# Patient Record
Sex: Female | Born: 1955 | Race: White | Hispanic: No | Smoking: Never smoker
Health system: Southern US, Community
[De-identification: ages and names within clinical notes are randomized; demographics above are authoritative.]

## PROBLEM LIST (undated history)

## (undated) DIAGNOSIS — R4182 Altered mental status, unspecified: Secondary | ICD-10-CM

## (undated) DIAGNOSIS — F419 Anxiety disorder, unspecified: Secondary | ICD-10-CM

## (undated) DIAGNOSIS — I10 Essential (primary) hypertension: Secondary | ICD-10-CM

## (undated) DIAGNOSIS — T7840XA Allergy, unspecified, initial encounter: Secondary | ICD-10-CM

## (undated) DIAGNOSIS — G600 Hereditary motor and sensory neuropathy: Secondary | ICD-10-CM

## (undated) DIAGNOSIS — F19939 Other psychoactive substance use, unspecified with withdrawal, unspecified: Principal | ICD-10-CM

## (undated) DIAGNOSIS — F29 Unspecified psychosis not due to a substance or known physiological condition: Secondary | ICD-10-CM

## (undated) DIAGNOSIS — F1320 Sedative, hypnotic or anxiolytic dependence, uncomplicated: Secondary | ICD-10-CM

## (undated) DIAGNOSIS — E876 Hypokalemia: Secondary | ICD-10-CM

## (undated) DIAGNOSIS — R9431 Abnormal electrocardiogram [ECG] [EKG]: Secondary | ICD-10-CM

## (undated) DIAGNOSIS — R109 Unspecified abdominal pain: Secondary | ICD-10-CM

## (undated) DIAGNOSIS — E860 Dehydration: Secondary | ICD-10-CM

## (undated) DIAGNOSIS — I5189 Other ill-defined heart diseases: Secondary | ICD-10-CM

## (undated) HISTORY — DX: Hypokalemia: E87.6

## (undated) HISTORY — DX: Dehydration: E86.0

## (undated) HISTORY — DX: Abnormal electrocardiogram (ECG) (EKG): R94.31

## (undated) HISTORY — DX: Allergy, unspecified, initial encounter: T78.40XA

## (undated) HISTORY — DX: Unspecified psychosis not due to a substance or known physiological condition: F29

## (undated) HISTORY — DX: Altered mental status, unspecified: R41.82

## (undated) HISTORY — DX: Unspecified abdominal pain: R10.9

## (undated) HISTORY — PX: ABDOMINAL HYSTERECTOMY: SHX81

---

## 2003-06-22 ENCOUNTER — Other Ambulatory Visit: Payer: Self-pay

## 2003-06-25 ENCOUNTER — Other Ambulatory Visit: Payer: Self-pay

## 2003-07-13 ENCOUNTER — Other Ambulatory Visit: Payer: Self-pay

## 2003-11-18 ENCOUNTER — Inpatient Hospital Stay: Payer: Self-pay | Admitting: Internal Medicine

## 2003-11-20 ENCOUNTER — Other Ambulatory Visit: Payer: Self-pay

## 2003-12-24 ENCOUNTER — Ambulatory Visit: Payer: Self-pay | Admitting: Internal Medicine

## 2004-02-14 ENCOUNTER — Emergency Department: Payer: Self-pay | Admitting: Emergency Medicine

## 2004-08-13 ENCOUNTER — Emergency Department: Payer: Self-pay | Admitting: Emergency Medicine

## 2004-08-23 ENCOUNTER — Emergency Department: Payer: Self-pay | Admitting: Emergency Medicine

## 2005-04-29 ENCOUNTER — Other Ambulatory Visit: Payer: Self-pay

## 2005-04-29 ENCOUNTER — Emergency Department: Payer: Self-pay | Admitting: Emergency Medicine

## 2005-09-25 ENCOUNTER — Emergency Department: Payer: Self-pay | Admitting: Emergency Medicine

## 2005-11-27 ENCOUNTER — Inpatient Hospital Stay: Payer: Self-pay | Admitting: Internal Medicine

## 2005-11-27 ENCOUNTER — Other Ambulatory Visit: Payer: Self-pay

## 2006-02-14 ENCOUNTER — Inpatient Hospital Stay: Payer: Self-pay | Admitting: Internal Medicine

## 2006-02-14 ENCOUNTER — Other Ambulatory Visit: Payer: Self-pay

## 2007-07-20 ENCOUNTER — Emergency Department: Payer: Self-pay | Admitting: Emergency Medicine

## 2007-11-20 ENCOUNTER — Emergency Department: Payer: Self-pay | Admitting: Emergency Medicine

## 2007-12-06 ENCOUNTER — Emergency Department: Payer: Self-pay | Admitting: Emergency Medicine

## 2007-12-25 ENCOUNTER — Emergency Department: Payer: Self-pay | Admitting: Emergency Medicine

## 2008-03-09 ENCOUNTER — Observation Stay: Payer: Self-pay | Admitting: Internal Medicine

## 2008-03-10 ENCOUNTER — Inpatient Hospital Stay: Payer: Self-pay | Admitting: Psychiatry

## 2011-10-24 ENCOUNTER — Emergency Department: Payer: Self-pay | Admitting: Emergency Medicine

## 2011-12-01 ENCOUNTER — Ambulatory Visit (HOSPITAL_COMMUNITY): Payer: Self-pay | Admitting: Physical Therapy

## 2011-12-06 ENCOUNTER — Ambulatory Visit (HOSPITAL_COMMUNITY): Payer: Self-pay | Admitting: Physical Therapy

## 2011-12-11 ENCOUNTER — Emergency Department (HOSPITAL_COMMUNITY): Payer: Self-pay

## 2011-12-11 ENCOUNTER — Inpatient Hospital Stay (HOSPITAL_COMMUNITY): Payer: Self-pay

## 2011-12-11 ENCOUNTER — Observation Stay (HOSPITAL_COMMUNITY)
Admission: EM | Admit: 2011-12-11 | Discharge: 2011-12-14 | DRG: 896 | Disposition: A | Discharge status: Home / Self Care | Payer: MEDICAID | Attending: Internal Medicine | Admitting: Internal Medicine

## 2011-12-11 ENCOUNTER — Encounter (HOSPITAL_COMMUNITY): Payer: Self-pay

## 2011-12-11 DIAGNOSIS — D72829 Elevated white blood cell count, unspecified: Secondary | ICD-10-CM | POA: Diagnosis present

## 2011-12-11 DIAGNOSIS — E860 Dehydration: Secondary | ICD-10-CM

## 2011-12-11 DIAGNOSIS — R55 Syncope and collapse: Secondary | ICD-10-CM

## 2011-12-11 DIAGNOSIS — Z79899 Other long term (current) drug therapy: Secondary | ICD-10-CM

## 2011-12-11 DIAGNOSIS — R9431 Abnormal electrocardiogram [ECG] [EKG]: Secondary | ICD-10-CM

## 2011-12-11 DIAGNOSIS — I5189 Other ill-defined heart diseases: Secondary | ICD-10-CM | POA: Diagnosis present

## 2011-12-11 DIAGNOSIS — F29 Unspecified psychosis not due to a substance or known physiological condition: Secondary | ICD-10-CM

## 2011-12-11 DIAGNOSIS — F19939 Other psychoactive substance use, unspecified with withdrawal, unspecified: Principal | ICD-10-CM

## 2011-12-11 DIAGNOSIS — E876 Hypokalemia: Secondary | ICD-10-CM

## 2011-12-11 DIAGNOSIS — G9340 Encephalopathy, unspecified: Secondary | ICD-10-CM | POA: Diagnosis present

## 2011-12-11 DIAGNOSIS — F1920 Other psychoactive substance dependence, uncomplicated: Secondary | ICD-10-CM | POA: Diagnosis present

## 2011-12-11 DIAGNOSIS — F19951 Other psychoactive substance use, unspecified with psychoactive substance-induced psychotic disorder with hallucinations: Secondary | ICD-10-CM | POA: Diagnosis present

## 2011-12-11 DIAGNOSIS — I10 Essential (primary) hypertension: Secondary | ICD-10-CM

## 2011-12-11 DIAGNOSIS — N289 Disorder of kidney and ureter, unspecified: Secondary | ICD-10-CM

## 2011-12-11 DIAGNOSIS — F419 Anxiety disorder, unspecified: Secondary | ICD-10-CM | POA: Diagnosis present

## 2011-12-11 DIAGNOSIS — F411 Generalized anxiety disorder: Secondary | ICD-10-CM | POA: Diagnosis present

## 2011-12-11 DIAGNOSIS — R443 Hallucinations, unspecified: Secondary | ICD-10-CM

## 2011-12-11 DIAGNOSIS — R4182 Altered mental status, unspecified: Secondary | ICD-10-CM

## 2011-12-11 DIAGNOSIS — R440 Auditory hallucinations: Secondary | ICD-10-CM

## 2011-12-11 DIAGNOSIS — A498 Other bacterial infections of unspecified site: Secondary | ICD-10-CM | POA: Diagnosis present

## 2011-12-11 DIAGNOSIS — F1320 Sedative, hypnotic or anxiolytic dependence, uncomplicated: Secondary | ICD-10-CM

## 2011-12-11 DIAGNOSIS — N390 Urinary tract infection, site not specified: Secondary | ICD-10-CM

## 2011-12-11 DIAGNOSIS — F1921 Other psychoactive substance dependence, in remission: Secondary | ICD-10-CM | POA: Diagnosis present

## 2011-12-11 HISTORY — DX: Abnormal electrocardiogram (ECG) (EKG): R94.31

## 2011-12-11 HISTORY — DX: Dehydration: E86.0

## 2011-12-11 HISTORY — DX: Altered mental status, unspecified: R41.82

## 2011-12-11 HISTORY — DX: Sedative, hypnotic or anxiolytic dependence, uncomplicated: F13.20

## 2011-12-11 HISTORY — DX: Anxiety disorder, unspecified: F41.9

## 2011-12-11 HISTORY — DX: Hypokalemia: E87.6

## 2011-12-11 HISTORY — DX: Essential (primary) hypertension: I10

## 2011-12-11 HISTORY — DX: Other psychoactive substance use, unspecified with withdrawal, unspecified: F19.939

## 2011-12-11 HISTORY — DX: Other ill-defined heart diseases: I51.89

## 2011-12-11 LAB — CBC WITH DIFFERENTIAL/PLATELET
Basophils Absolute: 0.1 10*3/uL (ref 0.0–0.1)
Basophils Relative: 0 % (ref 0–1)
Eosinophils Absolute: 0.1 10*3/uL (ref 0.0–0.7)
Hemoglobin: 14.7 g/dL (ref 12.0–15.0)
MCH: 34.2 pg — ABNORMAL HIGH (ref 26.0–34.0)
MCHC: 34.7 g/dL (ref 30.0–36.0)
Monocytes Relative: 8 % (ref 3–12)
Neutro Abs: 13.8 10*3/uL — ABNORMAL HIGH (ref 1.7–7.7)
Neutrophils Relative %: 75 % (ref 43–77)
Platelets: 316 10*3/uL (ref 150–400)
RDW: 14.2 % (ref 11.5–15.5)

## 2011-12-11 LAB — RAPID URINE DRUG SCREEN, HOSP PERFORMED
Barbiturates: NOT DETECTED
Cocaine: NOT DETECTED
Opiates: NOT DETECTED
Tetrahydrocannabinol: NOT DETECTED

## 2011-12-11 LAB — BASIC METABOLIC PANEL
Chloride: 96 mEq/L (ref 96–112)
GFR calc Af Amer: 50 mL/min — ABNORMAL LOW (ref >90)
GFR calc non Af Amer: 43 mL/min — ABNORMAL LOW (ref >90)
Potassium: 2.4 mEq/L — CL (ref 3.5–5.1)
Sodium: 136 mEq/L (ref 135–145)

## 2011-12-11 LAB — HEPATIC FUNCTION PANEL
AST: 30 U/L (ref 0–37)
Albumin: 4.5 g/dL (ref 3.5–5.2)
Total Bilirubin: 0.9 mg/dL (ref 0.3–1.2)
Total Protein: 8.1 g/dL (ref 6.0–8.3)

## 2011-12-11 LAB — URINALYSIS, ROUTINE W REFLEX MICROSCOPIC
Hgb urine dipstick: NEGATIVE
Nitrite: NEGATIVE
Protein, ur: NEGATIVE mg/dL
Specific Gravity, Urine: 1.025 (ref 1.005–1.030)
Urobilinogen, UA: 0.2 mg/dL (ref 0.0–1.0)

## 2011-12-11 LAB — TROPONIN I: Troponin I: <0.3 ng/mL (ref <0.30)

## 2011-12-11 LAB — LIPASE, BLOOD: Lipase: 31 U/L (ref 11–59)

## 2011-12-11 LAB — ETHANOL: Alcohol, Ethyl (B): <11 mg/dL (ref 0–11)

## 2011-12-11 LAB — URINE MICROSCOPIC-ADD ON

## 2011-12-11 MED ORDER — ONDANSETRON HCL 4 MG PO TABS: 4.0000 mg | ORAL_TABLET | Freq: Four times a day (QID) | ORAL | Status: DC | PRN

## 2011-12-11 MED ORDER — POTASSIUM CHLORIDE CRYS ER 20 MEQ PO TBCR
40.0000 meq | EXTENDED_RELEASE_TABLET | Freq: Once | ORAL | Status: AC
  Administered 2011-12-11: 40 meq via ORAL
  Filled 2011-12-11: qty 2

## 2011-12-11 MED ORDER — SODIUM CHLORIDE 0.9 % IV SOLN
INTRAVENOUS | Status: DC
  Administered 2011-12-11: 21:00:00 via INTRAVENOUS

## 2011-12-11 MED ORDER — POTASSIUM CHLORIDE 10 MEQ/100ML IV SOLN
10.0000 meq | INTRAVENOUS | Status: AC
  Administered 2011-12-11 – 2011-12-12 (×4): 10 meq via INTRAVENOUS
  Filled 2011-12-11: qty 400

## 2011-12-11 MED ORDER — ACETAMINOPHEN 650 MG RE SUPP: 650.0000 mg | Freq: Four times a day (QID) | RECTAL | Status: DC | PRN

## 2011-12-11 MED ORDER — ACETAMINOPHEN 325 MG PO TABS
650.0000 mg | ORAL_TABLET | Freq: Four times a day (QID) | ORAL | Status: DC | PRN
  Administered 2011-12-12 – 2011-12-13 (×2): 650 mg via ORAL
  Filled 2011-12-11 (×2): qty 2

## 2011-12-11 MED ORDER — ONDANSETRON HCL 4 MG/2ML IJ SOLN: 4.0000 mg | Freq: Four times a day (QID) | INTRAMUSCULAR | Status: DC | PRN

## 2011-12-11 MED ORDER — POTASSIUM CHLORIDE IN NACL 20-0.9 MEQ/L-% IV SOLN
INTRAVENOUS | Status: AC
  Administered 2011-12-11: 23:00:00 via INTRAVENOUS

## 2011-12-11 MED ORDER — POTASSIUM CHLORIDE 10 MEQ/100ML IV SOLN
10.0000 meq | INTRAVENOUS | Status: DC
  Administered 2011-12-11 (×2): 10 meq via INTRAVENOUS
  Filled 2011-12-11 (×2): qty 100

## 2011-12-11 MED ORDER — SODIUM CHLORIDE 0.9 % IV BOLUS (SEPSIS)
500.0000 mL | Freq: Once | INTRAVENOUS | Status: AC
  Administered 2011-12-11: 500 mL via INTRAVENOUS

## 2011-12-11 MED ORDER — HYDRALAZINE HCL 20 MG/ML IJ SOLN
10.0000 mg | Freq: Four times a day (QID) | INTRAMUSCULAR | Status: DC | PRN
  Administered 2011-12-12: 10 mg via INTRAVENOUS
  Filled 2011-12-11: qty 1

## 2011-12-11 MED ORDER — POTASSIUM CHLORIDE CRYS ER 20 MEQ PO TBCR
40.0000 meq | EXTENDED_RELEASE_TABLET | ORAL | Status: AC
  Administered 2011-12-11 – 2011-12-12 (×2): 40 meq via ORAL
  Filled 2011-12-11 (×2): qty 2

## 2011-12-11 MED ORDER — ALBUTEROL SULFATE (5 MG/ML) 0.5% IN NEBU: 2.5000 mg | INHALATION_SOLUTION | RESPIRATORY_TRACT | Status: DC | PRN

## 2011-12-11 NOTE — ED Notes (Signed)
CRITICAL VALUE ALERT  Critical value received:  Potassium - 2.4  Date of notification:  12/11/2011  Time of notification:  1900  Critical value read back: yes  Nurse who received alert:  Ricard Dillon  MD notified (1st page):  Dr Lynelle Doctor  Time of first page:  1900  MD notified (2nd page):  Time of second page:  Responding MD:  Dr Lynelle Doctor  Time MD responded:  Mikle Bosworth
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
## 2011-12-11 NOTE — ED Notes (Signed)
Attempted to call report, nurse is with a patient and will call me right back.
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
## 2011-12-12 DIAGNOSIS — F19239 Other psychoactive substance dependence with withdrawal, unspecified: Principal | ICD-10-CM

## 2011-12-12 DIAGNOSIS — F29 Unspecified psychosis not due to a substance or known physiological condition: Secondary | ICD-10-CM

## 2011-12-12 DIAGNOSIS — F19939 Other psychoactive substance use, unspecified with withdrawal, unspecified: Principal | ICD-10-CM

## 2011-12-12 HISTORY — DX: Other psychoactive substance use, unspecified with withdrawal, unspecified: F19.939

## 2011-12-12 HISTORY — DX: Unspecified psychosis not due to a substance or known physiological condition: F29

## 2011-12-12 HISTORY — DX: Other psychoactive substance dependence with withdrawal, unspecified: F19.239

## 2011-12-12 LAB — CBC
HCT: 40.6 % (ref 36.0–46.0)
MCH: 33.1 pg (ref 26.0–34.0)
MCHC: 33.5 g/dL (ref 30.0–36.0)
MCV: 98.8 fL (ref 78.0–100.0)
RDW: 14.3 % (ref 11.5–15.5)

## 2011-12-12 LAB — COMPREHENSIVE METABOLIC PANEL
ALT: 26 U/L (ref 0–35)
Albumin: 4.1 g/dL (ref 3.5–5.2)
Alkaline Phosphatase: 86 U/L (ref 39–117)
Potassium: 5.1 mEq/L (ref 3.5–5.1)
Sodium: 137 mEq/L (ref 135–145)
Total Protein: 7.5 g/dL (ref 6.0–8.3)

## 2011-12-12 MED ORDER — LORAZEPAM 2 MG/ML IJ SOLN: 1.0000 mg | Freq: Four times a day (QID) | INTRAMUSCULAR | Status: DC

## 2011-12-12 MED ORDER — ALPRAZOLAM 1 MG PO TABS
1.0000 mg | ORAL_TABLET | Freq: Four times a day (QID) | ORAL | Status: DC
  Administered 2011-12-12 – 2011-12-14 (×7): 1 mg via ORAL
  Filled 2011-12-12 (×8): qty 1

## 2011-12-12 MED ORDER — LORAZEPAM 1 MG PO TABS
1.0000 mg | ORAL_TABLET | Freq: Four times a day (QID) | ORAL | Status: DC
  Administered 2011-12-12 (×2): 1 mg via ORAL
  Filled 2011-12-12: qty 1

## 2011-12-12 MED ORDER — LORAZEPAM 2 MG/ML IJ SOLN
1.0000 mg | INTRAMUSCULAR | Status: DC | PRN
  Filled 2011-12-12: qty 1

## 2011-12-12 MED ORDER — SODIUM CHLORIDE 0.9 % IJ SOLN
INTRAMUSCULAR | Status: AC
  Filled 2011-12-12: qty 3
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
## 2011-12-12 NOTE — Progress Notes (Signed)
Ativan po given to patient per Physician order.  Patient refused Ativan, said I was trying to poison her, I had not opened the package yet so I offered the patient  the packaging so she knows that we are not poisoning her.  Patient states (cussing) that we can stop piping in the chemicals in the air conditioner.  She is not a fool and can tell we are doing that, "her mouth is dry and tastes like shit".
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
## 2011-12-12 NOTE — BH Assessment (Addendum)
[facility] Assessment Progress Note      12/12/11.  1330 Referral made to [facility].  Pt not on wait list at this point.  [facility] contacted for authorization.  Pt locus score: 27.  [facility] requires denials even for self pay patients.  Contacted [facility] who was full, [facility] also full. Larita Fife).  [facility] has info.  Message left with [facility].  [facility]) also has no beds.   1400 [employer] Authorization:  7 days, 11/10-11/16.  Auth #[id number].  Daleen Squibb, LCSWA  11010/13. 1433.  [facility] called back and said referral must be made to [facility] for primary substance abuse pt.  Referral information was then sent to [facility]. [phone].

## 2011-12-12 NOTE — Progress Notes (Signed)
Called to see patient at her request to see the boss.  Patient irriate, cussing during her conversation with me.  She states that staff are not caring for her, not answering her call bell, refusing to bring her ice water, and to take her to bathroom.  She doesn't want the nurse or tech caring for her back in her room.  I offered other staff and she said that she did not want any of our staff taking care of her, as no one is doing their job.  While talking with her she ripped out her IV, ripped off the telemetry, and states that she wants no one in room.  As I was leaving she threw the tissue box at me.  I went and got Dr. Kerry Hough to come and talk with her.  Security called and [employer] police called.  Dr. Kerry Hough went in to talk with patient with continued loud talk, cussing and ended up throwing a water pitcher at him.  Police went in listened to her complaints, told her that a psych eval would be done and she would have to go along with it.  Instructed her that she was not to throw anything else at anyone, to take the medicine that the physician would order.
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
## 2011-12-13 DIAGNOSIS — N390 Urinary tract infection, site not specified: Secondary | ICD-10-CM | POA: Diagnosis present

## 2011-12-13 MED ORDER — BOOST / RESOURCE BREEZE PO LIQD
1.0000 | Freq: Two times a day (BID) | ORAL | Status: DC
  Administered 2011-12-13: 1 via ORAL
  Filled 2011-12-13 (×7): qty 1

## 2011-12-13 MED ORDER — ATENOLOL 25 MG PO TABS
50.0000 mg | ORAL_TABLET | Freq: Every day | ORAL | Status: DC
  Administered 2011-12-13 – 2011-12-14 (×2): 50 mg via ORAL
  Filled 2011-12-13 (×2): qty 2

## 2011-12-13 MED ORDER — CIPROFLOXACIN HCL 250 MG PO TABS
500.0000 mg | ORAL_TABLET | Freq: Two times a day (BID) | ORAL | Status: DC
  Administered 2011-12-13 – 2011-12-14 (×3): 500 mg via ORAL
  Filled 2011-12-13 (×3): qty 2

## 2011-12-13 NOTE — Progress Notes (Signed)
Pt was seen for a screen and found to have no PT needs.  PT will be d/c'd.

## 2011-12-13 NOTE — Progress Notes (Signed)
Held 2200 xanax due to pt finally sleeping well.  Continues to sleep at this time. No IV access due to pt apparently pulled it out earlier. Pt has no telemetry, no IV fluids or red socks due to refusing to keep them on in her apparent manic state.
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
## 2011-12-13 NOTE — Progress Notes (Signed)
INITIAL ADULT NUTRITION ASSESSMENT Date: 12/13/2011   Time: 11:45 AM Reason for Assessment: Malnutrition Screen  ASSESSMENT: Female 56 y.o.  Dx: Altered mental status   Past Medical History  Diagnosis Date  . Hypertension   . Anxiety     Scheduled Meds:   . ALPRAZolam  1 mg Oral QID  . atenolol  50 mg Oral Daily  . [EXPIRED] sodium chloride      . [DISCONTINUED] LORazepam  1 mg Intramuscular Q6H  . [DISCONTINUED] LORazepam  1 mg Oral Q6H   Continuous Infusions:  PRN Meds:.acetaminophen, acetaminophen, hydrALAZINE, LORazepam, ondansetron (ZOFRAN) IV, ondansetron  Ht: 5\' 5"  (165.1 cm)  Wt: 127 lb 3.3 oz (57.7 kg)  Ideal Wt: 57 kg % Ideal Wt: 101%  Usual Wt: Wt Readings from Last 10 Encounters:  12/11/11 127 lb 3.3 oz (57.7 kg)    Body mass index is 21.17 kg/(m^2). Within Normal Range  Food/Nutrition Related Hx: Pt unable to provide hx at this time due to altered mental status.Sitter is with her and reports she just received Xanax medication. Her current oral intake is inadequate to meet estimated needs (25%). However, based on available hx she doesn't meet criteria for malnutrition at this time.   CMP     Component Value Date/Time   NA 137 12/12/2011 0640   K 5.1 12/12/2011 0640   CL 107 12/12/2011 0640   CO2 22 12/12/2011 0640   GLUCOSE 99 12/12/2011 0640   BUN 8 12/12/2011 0640   CREATININE 0.90 12/12/2011 0640   CALCIUM 9.7 12/12/2011 0640   PROT 7.5 12/12/2011 0640   ALBUMIN 4.1 12/12/2011 0640   AST 24 12/12/2011 0640   ALT 26 12/12/2011 0640   ALKPHOS 86 12/12/2011 0640   BILITOT 1.0 12/12/2011 0640   GFRNONAA 70* 12/12/2011 0640   GFRAA 81* 12/12/2011 0640    Intake/Output Summary (Last 24 hours) at 12/13/11 1147 Last data filed at 12/13/11 0800  Gross per 24 hour  Intake      0 ml  Output      0 ml  Net      0 ml    Diet Order: Sodium Restricted  Supplements/Tube Feeding:none at this time  IVF:    Estimated Nutritional  Needs:   Kcal:1450-1740 kcal Protein:64-75 gr Fluid:1 ml/kcal  NUTRITION DIAGNOSIS: -Inadequate oral intake (NI-2.1).  Status: Ongoing  RELATED TO: altered mental status  AS EVIDENCE BY: meal intake at 25%  MONITORING/EVALUATION(Goals): Monitor meals and oral supplement intake also labs and wt changes Goal: Pt to meet >/= 90% of their estimated nutrition needs; not met  EDUCATION NEEDS: -No education needs identified at this time  INTERVENTION: -Add Resource Breeze po BID, each supplement provides 250 kcal and 9 grams of protein.  Dietitian [phone]  DOCUMENTATION CODES Per approved criteria  -Not Applicable    Francene Boyers 12/13/2011, 11:45 AM

## 2011-12-13 NOTE — Progress Notes (Signed)
*  PRELIMINARY RESULTS* Echocardiogram 2D Echocardiogram has been performed.  Conrad [place] 12/13/2011, 10:05 AM

## 2011-12-14 ENCOUNTER — Encounter (HOSPITAL_COMMUNITY): Payer: Self-pay | Admitting: Internal Medicine

## 2011-12-14 DIAGNOSIS — F1320 Sedative, hypnotic or anxiolytic dependence, uncomplicated: Secondary | ICD-10-CM

## 2011-12-14 DIAGNOSIS — F419 Anxiety disorder, unspecified: Secondary | ICD-10-CM

## 2011-12-14 DIAGNOSIS — I5189 Other ill-defined heart diseases: Secondary | ICD-10-CM

## 2011-12-14 HISTORY — DX: Anxiety disorder, unspecified: F41.9

## 2011-12-14 HISTORY — DX: Other ill-defined heart diseases: I51.89

## 2011-12-14 HISTORY — DX: Sedative, hypnotic or anxiolytic dependence, uncomplicated: F13.20

## 2011-12-14 LAB — BASIC METABOLIC PANEL
Calcium: 9.6 mg/dL (ref 8.4–10.5)
GFR calc Af Amer: 84 mL/min — ABNORMAL LOW (ref >90)
GFR calc non Af Amer: 73 mL/min — ABNORMAL LOW (ref >90)
Glucose, Bld: 96 mg/dL (ref 70–99)
Potassium: 4.1 mEq/L (ref 3.5–5.1)
Sodium: 137 mEq/L (ref 135–145)

## 2011-12-14 LAB — CBC
Hemoglobin: 13.5 g/dL (ref 12.0–15.0)
MCH: 33.6 pg (ref 26.0–34.0)
MCHC: 33.9 g/dL (ref 30.0–36.0)
Platelets: 233 10*3/uL (ref 150–400)
RDW: 14.1 % (ref 11.5–15.5)

## 2011-12-14 LAB — URINE CULTURE: Colony Count: >=100000

## 2011-12-14 MED ORDER — ALPRAZOLAM 1 MG PO TABS: 1.0000 mg | ORAL_TABLET | Freq: Four times a day (QID) | ORAL | Status: DC

## 2011-12-14 MED ORDER — TRAZODONE HCL 50 MG PO TABS
25.0000 mg | ORAL_TABLET | Freq: Once | ORAL | Status: AC
  Administered 2011-12-14: 25 mg via ORAL
  Filled 2011-12-14: qty 1

## 2011-12-14 MED ORDER — CIPROFLOXACIN HCL 500 MG PO TABS: 500.0000 mg | ORAL_TABLET | Freq: Two times a day (BID) | ORAL | Status: DC

## 2011-12-14 NOTE — Clinical Social Work Psychosocial (Signed)
Clinical Social Work Department BRIEF PSYCHOSOCIAL ASSESSMENT 12/14/2011  Patient:  Maria Wheeler, Maria Wheeler     Account Number:  [id number]     Admit date:  12/11/2011  Clinical Social Worker:  Nancie Neas  Date/Time:  12/14/2011 01:25 PM  Referred by:  Physician  Date Referred:  12/14/2011 Referred for  Behavioral Health Issues   Other Referral:   Interview type:  Patient Other interview type:    PSYCHOSOCIAL DATA Living Status:  ALONE Admitted from facility:   Level of care:   Primary support name:  Barbara Cower Primary support relationship to patient:  CHILD, ADULT Degree of support available:   supportive per pt    CURRENT CONCERNS Current Concerns  Behavioral Health Issues   Other Concerns:    SOCIAL WORK ASSESSMENT / PLAN CSW met with pt at bedside following referral for outpatient mental health follow up. Pt assessed by ACT and determined not to need inpatient treatment. Pt alert and oriented and states she lives alone. Her son lives in [place] and pt reports they have a good relationship. Pt was laid off 3 months ago. She said she has limited support in [place] as the only reason she moved here was for her job. Pt states her PCP, Dr. Loney Hering stopped prescribing her Xanax because he said pt called another MD to get another prescription. Pt admitted to overusing Xanax in the past. Due to stopping Xanax abruptly, pt became very confused and was hallucinating. Pt admitted for medical clearance for inpatient treatment, but this was no longer felt to be needed. Pt indicates she saw a psychiatrist in 1995/07/04 after the death of her nephew in a care acciden. This is also when she started taking Xanax. Pt is agreeable to outpatient follow up at [facility] tomorrow morning. Information on AVS for d/c.   Assessment/plan status:  Referral to [facility] Other assessment/ plan:   Information/referral to community resources:   [facility]  [facility]    PATIENT'S/FAMILY'S RESPONSE TO PLAN OF  CARE: Pt has transportation for appointment tomorrow but is concerned about getting home today. Contact information given for pt to call [facility].        Derenda Fennel, [place] [phone]
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
## 2012-12-22 DIAGNOSIS — R109 Unspecified abdominal pain: Secondary | ICD-10-CM

## 2012-12-22 HISTORY — DX: Unspecified abdominal pain: R10.9

## 2013-04-02 DIAGNOSIS — F418 Other specified anxiety disorders: Secondary | ICD-10-CM | POA: Insufficient documentation

## 2013-12-30 ENCOUNTER — Emergency Department: Payer: Self-pay | Admitting: Emergency Medicine

## 2013-12-30 LAB — PROTIME-INR
INR: 1
Prothrombin Time: 13.2 secs (ref 11.5–14.7)

## 2013-12-30 LAB — COMPREHENSIVE METABOLIC PANEL
ALK PHOS: 85 U/L
ALT: 34 U/L
ANION GAP: 10 (ref 7–16)
Albumin: 3.3 g/dL — ABNORMAL LOW (ref 3.4–5.0)
BILIRUBIN TOTAL: 0.4 mg/dL (ref 0.2–1.0)
BUN: 4 mg/dL — AB (ref 7–18)
CHLORIDE: 104 mmol/L (ref 98–107)
CREATININE: 0.67 mg/dL (ref 0.60–1.30)
Calcium, Total: 8.3 mg/dL — ABNORMAL LOW (ref 8.5–10.1)
Co2: 20 mmol/L — ABNORMAL LOW (ref 21–32)
EGFR (African American): >60
Glucose: 89 mg/dL (ref 65–99)
Osmolality: 265 (ref 275–301)
POTASSIUM: 4.1 mmol/L (ref 3.5–5.1)
SGOT(AST): 31 U/L (ref 15–37)
Sodium: 134 mmol/L — ABNORMAL LOW (ref 136–145)
Total Protein: 7.2 g/dL (ref 6.4–8.2)

## 2013-12-30 LAB — MAGNESIUM: Magnesium: 1.9 mg/dL

## 2013-12-30 LAB — LIPASE, BLOOD: LIPASE: 173 U/L (ref 73–393)

## 2013-12-30 LAB — ETHANOL: Ethanol: 159 mg/dL

## 2013-12-30 LAB — TROPONIN I: Troponin-I: <0.02 ng/mL

## 2013-12-30 LAB — APTT: ACTIVATED PTT: 28.9 s (ref 23.6–35.9)

## 2013-12-31 DIAGNOSIS — S065XAA Traumatic subdural hemorrhage with loss of consciousness status unknown, initial encounter: Secondary | ICD-10-CM | POA: Insufficient documentation

## 2013-12-31 DIAGNOSIS — S065X9A Traumatic subdural hemorrhage with loss of consciousness of unspecified duration, initial encounter: Secondary | ICD-10-CM | POA: Insufficient documentation

## 2014-01-01 ENCOUNTER — Emergency Department: Payer: Self-pay | Admitting: Emergency Medicine

## 2014-01-01 LAB — URINALYSIS, COMPLETE
BILIRUBIN, UR: NEGATIVE
Blood: NEGATIVE
GLUCOSE, UR: NEGATIVE mg/dL (ref 0–75)
KETONE: NEGATIVE
Leukocyte Esterase: NEGATIVE
Nitrite: POSITIVE
PH: 5 (ref 4.5–8.0)
Protein: NEGATIVE
RBC,UR: 2 /HPF (ref 0–5)
Specific Gravity: 1.009 (ref 1.003–1.030)

## 2014-01-04 LAB — URINE CULTURE

## 2014-04-11 ENCOUNTER — Ambulatory Visit: Payer: Self-pay | Admitting: Neurology

## 2014-05-25 NOTE — Consult Note (Signed)
Asked by Dr Fanny BienQuale to do an ER Consult for accelerated HTN accelerated HTNon atenolol 100mg  po dailyhas taken norvasc in the past- will try 10mg  po stat and dailycould be secondary to pain with headacheHeadache, subdural heamtomaER physician had set up [facility] but PT was unable to work with the patient with elevated Blood [facility] and sent homeCharcot Marie Tooth diseaseF/u UC sent in ER, no urinary symptoms  Electronic Signatures: Alford HighlandWieting, Taja Pentland (MD)  (Signed on 02-Dec-15 17:57)  Authored  Last Updated: 02-Dec-15 17:57 by Alford HighlandWieting, Tamaya Pun (MD)

## 2014-05-25 NOTE — Consult Note (Signed)
PATIENT NAME:  Maria Wheeler, Maria Wheeler MR#:  [id number] DATE OF BIRTH:  [birth year]  DATE OF CONSULTATION:  01/02/2014  REFERRING PHYSICIAN:  Raelyn Ensign, MD, ER physician   CONSULTING PHYSICIAN:  Herschell Dimes. Renae Gloss, MD  PRIMARY CARE PHYSICIAN:  Marletta Lor, FNP   REASON FOR CONSULTATION: Accelerated hypertension.   HISTORY OF PRESENT ILLNESS: This is a 59 year old female who was recently released from [facility] after being there for 3 days for subdural hematoma after a fall. Her balance has been off because of the Charcot-Marie-Tooth disease and she had a fall and was evaluated there for the subdural hematoma. At home, she was barely able to walk and really dizzy and afraid to fall and called EMS to come to the ER. She has been in the ER now for 31 hours, and ER physician asked me to evaluate her for accelerated hypertension because physical therapy would not work with her with the blood pressure being elevated. She does have a bed at [facility], and is awaiting transfer there.   PAST MEDICAL HISTORY: Hypertension, subdural hematoma after a fall, Charcot-Marie-Tooth disease.   PAST SURGICAL HISTORY: Hysterectomy.   ALLERGIES: PENICILLIN.   MEDICATIONS: At home include Atenolol 100 mg daily.   SOCIAL HISTORY: No smoking, occasional beer, no drug use, is on disability with her Charcot-Marie-Tooth disease.   FAMILY HISTORY: Father died of COPD, mother died of a Charcot-Marie-Tooth disease.   REVIEW OF SYSTEMS:  CONSTITUTIONAL: Positive for fatigue. No fever, chills, or sweats.  EYES: She does wear glasses.  EARS AND NOSE AND MOUTH AND THROAT: No hearing loss. No sore throat. No difficulty swallowing.  CARDIOVASCULAR: No chest pain. No palpitations.  RESPIRATORY: No shortness of breath. No cough. No sputum. No hemoptysis.  GASTROINTESTINAL: Diarrhea yesterday after a Percocet given. No nausea, vomiting, or abdominal pain. No bright red blood per rectum. No melena.  GENITOURINARY: No  burning on urination or hematuria.  MUSCULOSKELETAL: No joint pain or muscle pain.  INTEGUMENT: No rashes or eruptions.  NEUROLOGIC: No fainting or blackouts.  PSYCHIATRIC: No anxiety or depression.  ENDOCRINE: No thyroid problems.  HEMATOLOGIC AND LYMPHATIC: No anemia, no easy bruising or bleeding.   PHYSICAL EXAMINATION: VITAL SIGNS: Temperature 98.8, pulse 78, respirations 16, blood pressure 190/108, pulse oximetry 98% on room air. When she came into the hospital 31 hours ago and 31 minutes, she had a blood pressure of 130/89, at that time; pulse of 81, respirations 16.  GENERAL: No respiratory distress.  EYES: Conjunctivae and lids normal. Pupils equal, round, and reactive to light. Extraocular muscles intact. No nystagmus.  EARS AND NOSE AND MOUTH AND THROAT: Nasal mucosa, no erythema; throat, no erythema, no exudate seen; lips and gums, no lesions.  NECK: No JVD. No bruits. No lymphadenopathy. No thyromegaly. No thyroid nodules palpated.  RESPIRATORY: Lungs, clear to auscultation. No use of accessory muscles to breathe. No rhonchi, rales, or wheeze heard.  CARDIOVASCULAR: S1 and S2 normal. No gallops, rubs, or murmurs heard. Carotid upstroke 2+ bilaterally. No bruits. Dorsalis pedis pulses 2+ bilaterally. No edema of the lower extremity.  ABDOMEN: Soft, nontender. No organomegaly/splenomegaly. Normoactive bowel sounds. No masses felt.  LYMPHATIC: No lymph nodes in the neck.  MUSCULOSKELETAL: No clubbing, edema, or cyanosis.  SKIN: No rashes or ulcers seen.  NEUROLOGIC: Cranial nerves II through XII grossly intact; deep tendon reflexes 1+ bilateral in lower extremities.  PSYCHIATRIC: The patient is oriented to person, place, and time.   DIAGNOSTIC DATA: Urinalysis, positive nitrites, but leukocyte  esterase was negative; CT scan of the head showed small anterior falx sign, subdural hematoma, slightly smaller compared to recent prior study. Minimal subdural fluid, more posterior and  superior in location and the falx is slightly less prominent. EKG normal sinus rhythm, nonspecific ST-T wave changes; recent labs, November 29, included a glucose of 89, BUN 4, creatinine 0.67, sodium 134, potassium 4.1, chloride 104, CO2 of 20, calcium 8.3; liver function tests normal range; albumin low at 3.3, lipase 173, magnesium 1.9, INR 1.0, troponin negative, ethanol 159.   ASSESSMENT AND PLAN: 1.  Accelerated hypertension. The patient is on atenolol 100 mg daily. I will continue that on a daily basis. The patient has had Norvasc in the past. I will give 10 mg STAT dose of Norvasc and continue that on a daily basis. Part of the reason for accelerated hypertension may headache. She has had a headache since her subdural hematoma, so controlling pain may help.  2.  Headache with subdural hematoma. Control pain, I will try Norco 5/325 one tablet every 4 hours as needed for pain. She must take food with this. She did not tolerate the Percocet and oxycodone in the past.  We will try the Norco and see if that helps. The patient will follow up with [facility] for follow-up of her subdural hematoma and further testing with her balance.  3.  Charcot-Marie-Tooth disease. The patient has had balance issues with this. Follow up as outpatient with neurology.  4.  Follow up urine culture results that was sent in the ER. Urinalysis has trace leukocyte esterase, see if anything grows out of the urine culture and treat if needed. The patient does not have any urinary symptoms, so I will hold off on further treatment at this point.   TIME SPENT ON CONSULTATION: Was 50 minutes.   As per Dr. Fanny BienQuale, patient can stay in the ER. The patient will go to [facility] once cleared by physical therapy and a physical therapy evaluation; hopefully, blood pressure will be better for this evaluation and can be done soon.    ____________________________ Herschell Dimesichard J. Renae GlossWieting, MD rjw:nt D: 01/02/2014 18:06:28  ET T: 01/02/2014 18:34:25 ET JOB#: [id number]  cc: Herschell Dimesichard J. Renae GlossWieting, MD, <Dictator>  Salley ScarletICHARD J Kadejah Sandiford MD ELECTRONICALLY SIGNED 01/04/2014 12:16

## 2014-07-14 ENCOUNTER — Other Ambulatory Visit: Payer: Self-pay

## 2014-07-14 ENCOUNTER — Encounter: Payer: Self-pay | Admitting: Emergency Medicine

## 2014-07-14 ENCOUNTER — Emergency Department
Admission: EM | Admit: 2014-07-14 | Discharge: 2014-07-14 | Disposition: A | Discharge status: Home / Self Care | Payer: No Typology Code available for payment source | Attending: Emergency Medicine | Admitting: Emergency Medicine

## 2014-07-14 DIAGNOSIS — Z880 Allergy status to penicillin: Secondary | ICD-10-CM | POA: Diagnosis not present

## 2014-07-14 DIAGNOSIS — Z79899 Other long term (current) drug therapy: Secondary | ICD-10-CM | POA: Insufficient documentation

## 2014-07-14 DIAGNOSIS — M6281 Muscle weakness (generalized): Secondary | ICD-10-CM | POA: Diagnosis present

## 2014-07-14 DIAGNOSIS — Z792 Long term (current) use of antibiotics: Secondary | ICD-10-CM | POA: Diagnosis not present

## 2014-07-14 DIAGNOSIS — I10 Essential (primary) hypertension: Secondary | ICD-10-CM | POA: Diagnosis not present

## 2014-07-14 DIAGNOSIS — N390 Urinary tract infection, site not specified: Secondary | ICD-10-CM | POA: Insufficient documentation

## 2014-07-14 DIAGNOSIS — R531 Weakness: Secondary | ICD-10-CM

## 2014-07-14 LAB — URINALYSIS COMPLETE WITH MICROSCOPIC (ARMC ONLY)
Bilirubin Urine: NEGATIVE
Glucose, UA: NEGATIVE mg/dL
HGB URINE DIPSTICK: NEGATIVE
Ketones, ur: NEGATIVE mg/dL
NITRITE: NEGATIVE
PROTEIN: NEGATIVE mg/dL
SPECIFIC GRAVITY, URINE: 1.016 (ref 1.005–1.030)
pH: 6 (ref 5.0–8.0)

## 2014-07-14 LAB — CBC WITH DIFFERENTIAL/PLATELET
BASOS ABS: 0.1 10*3/uL (ref 0–0.1)
BASOS PCT: 1 %
EOS ABS: 0.1 10*3/uL (ref 0–0.7)
EOS PCT: 2 %
HEMATOCRIT: 41.4 % (ref 35.0–47.0)
Hemoglobin: 14.1 g/dL (ref 12.0–16.0)
LYMPHS ABS: 2.1 10*3/uL (ref 1.0–3.6)
LYMPHS PCT: 31 %
MCH: 33.1 pg (ref 26.0–34.0)
MCHC: 34.1 g/dL (ref 32.0–36.0)
MCV: 97.2 fL (ref 80.0–100.0)
MONOS PCT: 9 %
Monocytes Absolute: 0.6 10*3/uL (ref 0.2–0.9)
Neutro Abs: 3.9 10*3/uL (ref 1.4–6.5)
Neutrophils Relative %: 57 %
PLATELETS: 214 10*3/uL (ref 150–440)
RBC: 4.25 MIL/uL (ref 3.80–5.20)
RDW: 15.3 % — ABNORMAL HIGH (ref 11.5–14.5)
WBC: 6.9 10*3/uL (ref 3.6–11.0)

## 2014-07-14 LAB — BASIC METABOLIC PANEL
Anion gap: 10 (ref 5–15)
BUN: 8 mg/dL (ref 6–20)
CO2: 27 mmol/L (ref 22–32)
CREATININE: 0.83 mg/dL (ref 0.44–1.00)
Calcium: 9.3 mg/dL (ref 8.9–10.3)
Chloride: 95 mmol/L — ABNORMAL LOW (ref 101–111)
GFR calc Af Amer: >60 mL/min (ref >60)
GFR calc non Af Amer: >60 mL/min (ref >60)
Glucose, Bld: 104 mg/dL — ABNORMAL HIGH (ref 65–99)
Potassium: 3 mmol/L — ABNORMAL LOW (ref 3.5–5.1)
SODIUM: 132 mmol/L — AB (ref 135–145)

## 2014-07-14 MED ORDER — SODIUM CHLORIDE 0.9 % IV BOLUS (SEPSIS): 1000.0000 mL | Freq: Once | INTRAVENOUS | Status: AC

## 2014-07-14 MED ORDER — CEPHALEXIN 500 MG PO CAPS: 500.0000 mg | ORAL_CAPSULE | Freq: Three times a day (TID) | ORAL | Status: AC

## 2014-07-14 MED ORDER — HYDROCODONE-ACETAMINOPHEN 5-325 MG PO TABS
1.0000 | ORAL_TABLET | Freq: Once | ORAL | Status: AC
Start: 2014-07-14 — End: 2014-07-14
  Administered 2014-07-14: 1 via ORAL

## 2014-07-14 MED ORDER — POTASSIUM CHLORIDE CRYS ER 20 MEQ PO TBCR
40.0000 meq | EXTENDED_RELEASE_TABLET | Freq: Once | ORAL | Status: AC
  Administered 2014-07-14: 40 meq via ORAL

## 2014-07-14 MED ORDER — SODIUM CHLORIDE 0.9 % IV BOLUS (SEPSIS)
1000.0000 mL | Freq: Once | INTRAVENOUS | Status: AC
  Administered 2014-07-14: 1000 mL via INTRAVENOUS

## 2014-07-14 MED ORDER — TRAMADOL HCL 50 MG PO TABS: 50.0000 mg | ORAL_TABLET | Freq: Four times a day (QID) | ORAL | Status: AC | PRN

## 2014-07-14 MED ORDER — HYDROCODONE-ACETAMINOPHEN 5-325 MG PO TABS
ORAL_TABLET | ORAL | Status: AC
  Filled 2014-07-14: qty 1

## 2014-07-14 MED ORDER — POTASSIUM CHLORIDE CRYS ER 20 MEQ PO TBCR
EXTENDED_RELEASE_TABLET | ORAL | Status: AC
  Filled 2014-07-14: qty 2

## 2014-07-14 NOTE — ED Notes (Signed)
MD Lord at bedside. 

## 2014-07-14 NOTE — ED Provider Notes (Signed)
[facility] Emergency Department Provider Note   ____________________________________________  Time seen: 1:45 PM I have reviewed the triage vital signs and the triage nursing note.  HISTORY  Chief Complaint Fall   Historian Patient  HPI Maria Wheeler is a 59 y.o. female whose complaint is worsening generalized weakness over about 3 or 4 days. 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 She is complaining of no traumatic injury or pain from the falls. Just that she is so weak that it hard for urine to get up off the floor onto the couch if she falls. She did make a phone call to her neurologist today who suggested she come in for evaluation to the emergency department, at [facility]. The EMS brought her here to [facility]. Patient states she's been under lot of stress recently with regard to social issues between her and her son. She is expressing mild depression, but no significant depression or suicidal ideation. No chest pain, no fever, no recent illness, no cough, no bladder or urinary symptoms, no abdominal pain, no focal new weakness. She does have chronic left lower extremity weakness worse than the right and chronic paresthesias to the left upper and left lower extremities. Her generalized weakness overall has worsened, but no new or changing focal weakness or numbness.   Past Medical History  Diagnosis Date  . Hypertension   . Anxiety   . Benzodiazepine dependence 12/14/2011  . Chronic anxiety 12/14/2011  . Drug withdrawal 12/12/2011    Causing confusion, out at her hallucinations, etc. Resolved with Xanax was restarted.  . Diastolic dysfunction 12/14/2011    Grade 1. Ejection fraction 65-70%.    Patient Active Problem List   Diagnosis Date Noted  . Benzodiazepine  dependence 12/14/2011  . Chronic anxiety 12/14/2011  . Diastolic dysfunction 12/14/2011  . UTI (urinary tract infection) 12/13/2011  . Psychosis 12/12/2011  . Drug withdrawal 12/12/2011  . Altered mental status 12/11/2011  . Abnormal EKG 12/11/2011  . Syncope 12/11/2011  . Hypokalemia 12/11/2011  . HTN (hypertension), benign 12/11/2011  . Auditory hallucinations 12/11/2011  . History of drug dependence 12/11/2011  . Dehydration 12/11/2011    Past Surgical History  Procedure Laterality Date  . Abdominal hysterectomy      Current Outpatient Rx  Name  Route  Sig  Dispense  Refill  . ALPRAZolam (XANAX) 1 MG tablet   Oral   Take 1 tablet (1 mg total) by mouth 4 (four) times daily.   45 tablet   0   . atenolol (TENORMIN) 50 MG tablet   Oral   Take 50 mg by mouth daily.         . ciprofloxacin (CIPRO) 500 MG tablet   Oral   Take 1 tablet (500 mg total) by mouth 2 (two) times daily. Antibiotic to be taken for 4 more days.   8 tablet   0     Allergies Penicillins  History reviewed. No pertinent family history.  Social History History  Substance Use Topics  . Smoking status: Never Smoker   . Smokeless tobacco: Not on file  . Alcohol Use: 0.6 oz/week    1 Cans of beer per week    Review of Systems  Constitutional: Negative for fever. Eyes: Negative for visual changes. ENT: Negative for sore throat. Cardiovascular:  Negative for chest pain. Respiratory: Negative for shortness of breath. Gastrointestinal: Negative for abdominal pain, vomiting and diarrhea. Genitourinary: Negative for dysuria. Musculoskeletal: Negative for back pain. Skin: Negative for rash. Neurological: Negative for headaches.  ____________________________________________   PHYSICAL EXAM:  VITAL SIGNS: ED Triage Vitals  Enc Vitals Group     BP 07/14/14 1303 140/88 mmHg     Pulse Rate 07/14/14 1303 55     Resp 07/14/14 1303 18     Temp 07/14/14 1303 98.3 F (36.8 C)     Temp  Source 07/14/14 1303 Oral     SpO2 07/14/14 1303 96 %     Weight 07/14/14 1303 165 lb 8 oz (75.07 kg)     Height 07/14/14 1303  (1.651 m)     Head Cir --      Peak Flow --      Pain Score 07/14/14 1305 10     Pain Loc --      Pain Edu? --      Excl. in GC? --      Constitutional: Alert and oriented. Well appearing and in no distress. Eyes: Conjunctivae are normal. PERRL. Normal extraocular movements. ENT   Head: Normocephalic and atraumatic.   Nose: No congestion/rhinnorhea.   Mouth/Throat: Mucous membranes are moist.   Neck: No stridor. Cardiovascular: Normal rate, regular rhythm.  No murmurs, rubs, or gallops. Respiratory: Normal respiratory effort without tachypnea nor retractions. Breath sounds are clear and equal bilaterally. No wheezes/rales/rhonchi. Gastrointestinal: Soft. No distention, no guarding, no rebound. Nontender  Genitourinary/rectal: Deferred Musculoskeletal: Mild tenderness of soft tissues in 4 extremities. Neurologic:  Normal speech and language. Patient is 4-5 strength in 4 extremities. Paresthesia of the left arm and left leg Skin:  Skin is warm, dry and intact. No rash noted. Psychiatric: Mood and affect are normal. Speech and behavior are normal. Patient exhibits appropriate insight and judgment.  ____________________________________________   EKG I, Governor Rooks, MD, the attending physician have personally viewed and interpreted this ECG.   50 bpm. Sinus tachycardia with first degree AV block. Normal axis. Narrow QS. LVH with T-wave inversion laterally ____________________________________________  LABS (pertinent positives/negatives)  Sodium 132, potassium 3.0, chloride 95, glucose 104 White blood count 6.9, hemoglobin 14.1  ____________________________________________  RADIOLOGY Radiologist results reviewed   none __________________________________________  PROCEDURES  Procedure(s) performed: None Critical Care  performed: None  ____________________________________________   ED COURSE / ASSESSMENT AND PLAN  Pertinent labs & imaging results that were available during my care of the patient were reviewed by me and considered in my medical decision making (see chart for details).  Patient called her neurologist and reach the neurologist on-call with regard to progressive generalized weakness due to her Charcot-Marie-Tooth syndrome and multiple falls over the past couple days. She's not complaining of any traumatic injury. The neurologist on call suggested she come to an emergency department for evaluation for the generalized weakness and potentially for placement if she is unable to be safe at home. I was able to talk with the on-call neurologist at [facility] who had no specific concern or recommendation with regard to her Charcot-Marie-Tooth, just wanted her to have a physician evaluate given the patient's complaint of weakness. Patient's complaint of weakness is nonfocal here although she does have some left-sided worse than right sided in her lower extremity, she states this is always the case. There is no clinical history or physical exam findings concerning for traumatic injury, or acute stroke. I discussed with the patient checking labwork  evaluation and urinalysis.  I talked with the patient about  the possibility of acute care rehabilitation versus in home care. Patient is very averse to going into a nursing home or rehabilitation facility. She understands the risk of frequent falls given her weakness and is okay with this risk. I've asked her to follow up with her primary care doctor to determine whether or not she should have a home health or physical therapy outpatient or in-home evaluation. She can call the office tomorrow.  Patient's sodium was slightly low and potassium slightly low, she was given IV fluids and potassium replacement for this. Urinalysis is pending. Patient care transferred to Dr.  Lenard Lance.    ___________________________________________   FINAL CLINICAL IMPRESSION(S) / ED DIAGNOSES   Final diagnoses:  Generalized weakness      Governor Rooks, MD 07/14/14 1523

## 2014-07-14 NOTE — ED Provider Notes (Signed)
-----------------------------------------   5:34 PM on 07/14/2014 -----------------------------------------  Patient care taken from Dr. Shaune Pollack. Urinalysis has resulted showing a urinary tract infection. We'll place the patient on Keflex, and have her follow-up with her primary care doctor. Patient agreeable to plan. We'll discharge him at this time.  Minna Antis, MD 07/14/14 1734

## 2014-07-14 NOTE — ED Notes (Signed)
Patient to ED via EMS from home with c/o pain to head, feet and legs after fall while trying to get into bed. Patient reports history of falls due to genetic condition. Patient denies LOC but does report that she hit back of head on night stand.

## 2014-07-14 NOTE — Discharge Instructions (Signed)
No certain cause was found for your extreme weakness progression other than your baseline Charcot-Marie-Tooth syndrome. Your sodium level was slightly low and you're given IV fluids in the emergency department. Your potassium level was slightly low and you're giving potassium replacement emergency department. We discussed the possibility of acute care nursing home rehabilitation placement, however you did not want to do this although you understand the risk of falls and trauma injury at home. Call your primary doctor tomorrow to discuss whether or not you should have a home health/physical therapy evaluation at home. Return to the emergency room for any new or worsening condition including one-sided weakness, any injury from a fall, any altered mental status, or any worsening depression or thoughts of wanting to hurt yourself or others. Weakness Weakness is a lack of strength. You may feel weak all over your body or just in one part of your body. Weakness can be serious. In some cases, you may need more medical tests. HOME CARE  Rest.  Eat a well-balanced diet.  Try to exercise every day.  Only take medicines as told by your doctor. GET HELP RIGHT AWAY IF:   You cannot do your normal daily activities.  You cannot walk up and down stairs, or you feel very tired when you do so.  You have shortness of breath or chest pain.  You have trouble moving parts of your body.  You have weakness in only one body part or on only one side of the body.  You have a fever.  You have trouble speaking or swallowing.  You cannot control when you pee (urinate) or poop (bowel movement).  You have black or bloody throw up (vomit) or poop.  Your weakness gets worse or spreads to other body parts.  You have new aches or pains. MAKE SURE YOU:   Understand these instructions.  Will watch your condition.  Will get help right away if you are not doing well or get worse. Document Released: 01/01/2008  Document Revised: 07/20/2011 Document Reviewed: 03/19/2011 [facility] Patient Information 2015 [place], [place]. This information is not intended to replace advice given to you by your health care provider. Make sure you discuss any questions you have with your health care provider.  Urinary Tract Infection A urinary tract infection (UTI) can occur any place along the urinary tract. The tract includes the kidneys, ureters, bladder, and urethra. A type of germ called bacteria often causes a UTI. UTIs are often helped with antibiotic medicine.  HOME CARE   If given, take antibiotics as told by your doctor. Finish them even if you start to feel better.  Drink enough fluids to keep your pee (urine) clear or pale yellow.  Avoid tea, drinks with caffeine, and bubbly (carbonated) drinks.  Pee often. Avoid holding your pee in for a long time.  Pee before and after having sex (intercourse).  Wipe from front to back after you poop (bowel movement) if you are a woman. Use each tissue only once. GET HELP RIGHT AWAY IF:   You have back pain.  You have lower belly (abdominal) pain.  You have chills.  You feel sick to your stomach (nauseous).  You throw up (vomit).  Your burning or discomfort with peeing does not go away.  You have a fever.  Your symptoms are not better in 3 days. MAKE SURE YOU:   Understand these instructions.  Will watch your condition.  Will get help right away if you are not doing well or get  worse. Document Released: 07/07/2007 Document Revised: 10/13/2011 Document Reviewed: 08/19/2011 [facility] Patient Information 2015 [place], [place]. This information is not intended to replace advice given to you by your health care provider. Make sure you discuss any questions you have with your health care provider.

## 2014-07-16 ENCOUNTER — Other Ambulatory Visit: Payer: Self-pay

## 2014-07-16 ENCOUNTER — Emergency Department
Admission: EM | Admit: 2014-07-16 | Discharge: 2014-07-16 | Disposition: A | Discharge status: Other Acute Inpatient Hospital | Payer: No Typology Code available for payment source | Attending: Emergency Medicine | Admitting: Emergency Medicine

## 2014-07-16 ENCOUNTER — Encounter: Payer: Self-pay | Admitting: *Deleted

## 2014-07-16 DIAGNOSIS — Z792 Long term (current) use of antibiotics: Secondary | ICD-10-CM | POA: Insufficient documentation

## 2014-07-16 DIAGNOSIS — I10 Essential (primary) hypertension: Secondary | ICD-10-CM | POA: Insufficient documentation

## 2014-07-16 DIAGNOSIS — M6281 Muscle weakness (generalized): Secondary | ICD-10-CM | POA: Insufficient documentation

## 2014-07-16 DIAGNOSIS — F329 Major depressive disorder, single episode, unspecified: Secondary | ICD-10-CM | POA: Insufficient documentation

## 2014-07-16 DIAGNOSIS — Z880 Allergy status to penicillin: Secondary | ICD-10-CM | POA: Insufficient documentation

## 2014-07-16 DIAGNOSIS — R531 Weakness: Secondary | ICD-10-CM

## 2014-07-16 DIAGNOSIS — Z79899 Other long term (current) drug therapy: Secondary | ICD-10-CM | POA: Insufficient documentation

## 2014-07-16 HISTORY — DX: Hereditary motor and sensory neuropathy: G60.0

## 2014-07-16 LAB — URINE DRUG SCREEN, QUALITATIVE (ARMC ONLY)
Amphetamines, Ur Screen: NOT DETECTED
BARBITURATES, UR SCREEN: NOT DETECTED
Benzodiazepine, Ur Scrn: POSITIVE — AB
Cannabinoid 50 Ng, Ur ~~LOC~~: NOT DETECTED
Cocaine Metabolite,Ur ~~LOC~~: NOT DETECTED
MDMA (Ecstasy)Ur Screen: NOT DETECTED
METHADONE SCREEN, URINE: NOT DETECTED
Opiate, Ur Screen: NOT DETECTED
Phencyclidine (PCP) Ur S: NOT DETECTED
TRICYCLIC, UR SCREEN: NOT DETECTED

## 2014-07-16 LAB — URINALYSIS COMPLETE WITH MICROSCOPIC (ARMC ONLY)
BILIRUBIN URINE: NEGATIVE
GLUCOSE, UA: NEGATIVE mg/dL
HGB URINE DIPSTICK: NEGATIVE
Ketones, ur: NEGATIVE mg/dL
Nitrite: NEGATIVE
Protein, ur: NEGATIVE mg/dL
Specific Gravity, Urine: 1.009 (ref 1.005–1.030)
pH: 6 (ref 5.0–8.0)

## 2014-07-16 LAB — BASIC METABOLIC PANEL
Anion gap: 10 (ref 5–15)
BUN: 9 mg/dL (ref 6–20)
CALCIUM: 9.4 mg/dL (ref 8.9–10.3)
CHLORIDE: 97 mmol/L — AB (ref 101–111)
CO2: 26 mmol/L (ref 22–32)
Creatinine, Ser: 0.86 mg/dL (ref 0.44–1.00)
GFR calc non Af Amer: >60 mL/min (ref >60)
Glucose, Bld: 89 mg/dL (ref 65–99)
Potassium: 3.4 mmol/L — ABNORMAL LOW (ref 3.5–5.1)
Sodium: 133 mmol/L — ABNORMAL LOW (ref 135–145)

## 2014-07-16 LAB — LIPASE, BLOOD: Lipase: 33 U/L (ref 22–51)

## 2014-07-16 LAB — CBC
HCT: 41.4 % (ref 35.0–47.0)
Hemoglobin: 14.1 g/dL (ref 12.0–16.0)
MCH: 32.9 pg (ref 26.0–34.0)
MCHC: 34 g/dL (ref 32.0–36.0)
MCV: 96.6 fL (ref 80.0–100.0)
PLATELETS: 207 10*3/uL (ref 150–440)
RBC: 4.28 MIL/uL (ref 3.80–5.20)
RDW: 15.2 % — AB (ref 11.5–14.5)
WBC: 7.8 10*3/uL (ref 3.6–11.0)

## 2014-07-16 LAB — HEPATIC FUNCTION PANEL
ALT: 16 U/L (ref 14–54)
AST: 26 U/L (ref 15–41)
Albumin: 4.1 g/dL (ref 3.5–5.0)
Alkaline Phosphatase: 62 U/L (ref 38–126)
BILIRUBIN INDIRECT: 1.3 mg/dL — AB (ref 0.3–0.9)
Bilirubin, Direct: 0.3 mg/dL (ref 0.1–0.5)
Total Bilirubin: 1.6 mg/dL — ABNORMAL HIGH (ref 0.3–1.2)
Total Protein: 7.2 g/dL (ref 6.5–8.1)

## 2014-07-16 LAB — TROPONIN I: Troponin I: <0.03 ng/mL (ref <0.031)

## 2014-07-16 LAB — ETHANOL: Alcohol, Ethyl (B): <5 mg/dL (ref <5)

## 2014-07-16 LAB — MAGNESIUM: MAGNESIUM: 1.9 mg/dL (ref 1.7–2.4)
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
## 2014-07-16 NOTE — ED Notes (Signed)
Pt states she is not able to take her meds today due to weakness, unable to get to the medication, including BP med.
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
## 2015-02-20 DIAGNOSIS — G609 Hereditary and idiopathic neuropathy, unspecified: Secondary | ICD-10-CM | POA: Diagnosis not present

## 2015-02-20 DIAGNOSIS — I639 Cerebral infarction, unspecified: Secondary | ICD-10-CM | POA: Diagnosis not present

## 2015-02-20 DIAGNOSIS — I6200 Nontraumatic subdural hemorrhage, unspecified: Secondary | ICD-10-CM | POA: Diagnosis not present

## 2015-02-20 DIAGNOSIS — G600 Hereditary motor and sensory neuropathy: Secondary | ICD-10-CM | POA: Diagnosis not present

## 2015-05-29 DIAGNOSIS — G8929 Other chronic pain: Secondary | ICD-10-CM | POA: Diagnosis not present

## 2015-05-29 DIAGNOSIS — M545 Low back pain: Secondary | ICD-10-CM | POA: Diagnosis not present

## 2015-05-29 DIAGNOSIS — I160 Hypertensive urgency: Secondary | ICD-10-CM | POA: Diagnosis not present

## 2015-07-23 DIAGNOSIS — F411 Generalized anxiety disorder: Secondary | ICD-10-CM | POA: Diagnosis not present

## 2015-07-23 DIAGNOSIS — F331 Major depressive disorder, recurrent, moderate: Secondary | ICD-10-CM | POA: Diagnosis not present

## 2015-07-23 DIAGNOSIS — Z79899 Other long term (current) drug therapy: Secondary | ICD-10-CM | POA: Diagnosis not present

## 2015-07-23 DIAGNOSIS — F410 Panic disorder [episodic paroxysmal anxiety] without agoraphobia: Secondary | ICD-10-CM | POA: Diagnosis not present

## 2015-08-28 DIAGNOSIS — G609 Hereditary and idiopathic neuropathy, unspecified: Secondary | ICD-10-CM | POA: Diagnosis not present

## 2015-09-16 DIAGNOSIS — R29810 Facial weakness: Secondary | ICD-10-CM | POA: Diagnosis not present

## 2015-09-16 DIAGNOSIS — R296 Repeated falls: Secondary | ICD-10-CM | POA: Diagnosis not present

## 2015-09-16 DIAGNOSIS — R471 Dysarthria and anarthria: Secondary | ICD-10-CM | POA: Diagnosis not present

## 2015-09-16 DIAGNOSIS — M4802 Spinal stenosis, cervical region: Secondary | ICD-10-CM | POA: Diagnosis not present

## 2015-09-17 ENCOUNTER — Other Ambulatory Visit: Payer: Self-pay | Admitting: Psychiatry

## 2015-09-17 DIAGNOSIS — R29810 Facial weakness: Secondary | ICD-10-CM

## 2015-09-17 DIAGNOSIS — R296 Repeated falls: Secondary | ICD-10-CM

## 2015-09-17 DIAGNOSIS — R471 Dysarthria and anarthria: Secondary | ICD-10-CM

## 2015-09-18 ENCOUNTER — Ambulatory Visit
Admission: RE | Admit: 2015-09-18 | Discharge: 2015-09-18 | Disposition: A | Discharge status: Home / Self Care | Payer: Medicare Other | Source: Ambulatory Visit | Attending: Psychiatry | Admitting: Psychiatry

## 2015-09-18 DIAGNOSIS — R269 Unspecified abnormalities of gait and mobility: Secondary | ICD-10-CM | POA: Diagnosis not present

## 2015-09-18 DIAGNOSIS — R29810 Facial weakness: Secondary | ICD-10-CM | POA: Diagnosis not present

## 2015-09-18 DIAGNOSIS — R296 Repeated falls: Secondary | ICD-10-CM

## 2015-09-18 DIAGNOSIS — I639 Cerebral infarction, unspecified: Secondary | ICD-10-CM | POA: Diagnosis not present

## 2015-09-18 DIAGNOSIS — R471 Dysarthria and anarthria: Secondary | ICD-10-CM

## 2015-09-19 ENCOUNTER — Other Ambulatory Visit: Payer: Self-pay | Admitting: Psychiatry

## 2015-09-19 DIAGNOSIS — R29810 Facial weakness: Secondary | ICD-10-CM

## 2015-09-19 DIAGNOSIS — R296 Repeated falls: Secondary | ICD-10-CM

## 2015-09-25 ENCOUNTER — Ambulatory Visit
Admission: RE | Admit: 2015-09-25 | Discharge: 2015-09-25 | Disposition: A | Discharge status: Home / Self Care | Payer: Medicare Other | Source: Ambulatory Visit | Attending: Psychiatry | Admitting: Psychiatry

## 2015-09-25 DIAGNOSIS — R296 Repeated falls: Secondary | ICD-10-CM | POA: Diagnosis not present

## 2015-09-25 DIAGNOSIS — I639 Cerebral infarction, unspecified: Secondary | ICD-10-CM | POA: Diagnosis not present

## 2015-09-25 DIAGNOSIS — R29810 Facial weakness: Secondary | ICD-10-CM | POA: Diagnosis not present

## 2015-10-08 DIAGNOSIS — Z114 Encounter for screening for human immunodeficiency virus [HIV]: Secondary | ICD-10-CM | POA: Diagnosis not present

## 2015-10-08 DIAGNOSIS — Z1239 Encounter for other screening for malignant neoplasm of breast: Secondary | ICD-10-CM | POA: Diagnosis not present

## 2015-10-08 DIAGNOSIS — G600 Hereditary motor and sensory neuropathy: Secondary | ICD-10-CM | POA: Diagnosis not present

## 2015-10-08 DIAGNOSIS — Z1159 Encounter for screening for other viral diseases: Secondary | ICD-10-CM | POA: Diagnosis not present

## 2015-10-08 DIAGNOSIS — I10 Essential (primary) hypertension: Secondary | ICD-10-CM | POA: Diagnosis not present

## 2015-10-13 DIAGNOSIS — I10 Essential (primary) hypertension: Secondary | ICD-10-CM | POA: Diagnosis not present

## 2015-10-13 DIAGNOSIS — M79645 Pain in left finger(s): Secondary | ICD-10-CM | POA: Diagnosis not present

## 2015-10-13 DIAGNOSIS — M79672 Pain in left foot: Secondary | ICD-10-CM | POA: Diagnosis not present

## 2015-10-13 DIAGNOSIS — E876 Hypokalemia: Secondary | ICD-10-CM | POA: Diagnosis not present

## 2015-10-13 DIAGNOSIS — M79671 Pain in right foot: Secondary | ICD-10-CM | POA: Diagnosis not present

## 2015-11-02 ENCOUNTER — Emergency Department: Payer: Medicare Other

## 2015-11-02 ENCOUNTER — Encounter: Payer: Self-pay | Admitting: Emergency Medicine

## 2015-11-02 ENCOUNTER — Emergency Department
Admission: EM | Admit: 2015-11-02 | Discharge: 2015-11-02 | Disposition: A | Discharge status: Home / Self Care | Payer: Medicare Other | Attending: Emergency Medicine | Admitting: Emergency Medicine

## 2015-11-02 DIAGNOSIS — S60222A Contusion of left hand, initial encounter: Secondary | ICD-10-CM

## 2015-11-02 DIAGNOSIS — Z79899 Other long term (current) drug therapy: Secondary | ICD-10-CM | POA: Insufficient documentation

## 2015-11-02 DIAGNOSIS — I10 Essential (primary) hypertension: Secondary | ICD-10-CM | POA: Insufficient documentation

## 2015-11-02 DIAGNOSIS — Y999 Unspecified external cause status: Secondary | ICD-10-CM | POA: Diagnosis not present

## 2015-11-02 DIAGNOSIS — E876 Hypokalemia: Secondary | ICD-10-CM | POA: Diagnosis not present

## 2015-11-02 DIAGNOSIS — M79641 Pain in right hand: Secondary | ICD-10-CM | POA: Diagnosis not present

## 2015-11-02 DIAGNOSIS — N390 Urinary tract infection, site not specified: Secondary | ICD-10-CM

## 2015-11-02 DIAGNOSIS — W1809XA Striking against other object with subsequent fall, initial encounter: Secondary | ICD-10-CM | POA: Insufficient documentation

## 2015-11-02 DIAGNOSIS — S0990XA Unspecified injury of head, initial encounter: Secondary | ICD-10-CM | POA: Diagnosis not present

## 2015-11-02 DIAGNOSIS — Y92012 Bathroom of single-family (private) house as the place of occurrence of the external cause: Secondary | ICD-10-CM | POA: Insufficient documentation

## 2015-11-02 DIAGNOSIS — Y9389 Activity, other specified: Secondary | ICD-10-CM | POA: Diagnosis not present

## 2015-11-02 DIAGNOSIS — M79642 Pain in left hand: Secondary | ICD-10-CM | POA: Diagnosis not present

## 2015-11-02 DIAGNOSIS — R42 Dizziness and giddiness: Secondary | ICD-10-CM | POA: Diagnosis not present

## 2015-11-02 DIAGNOSIS — S199XXA Unspecified injury of neck, initial encounter: Secondary | ICD-10-CM | POA: Diagnosis not present

## 2015-11-02 LAB — BASIC METABOLIC PANEL
Anion gap: 6 (ref 5–15)
BUN: 14 mg/dL (ref 6–20)
CHLORIDE: 99 mmol/L — AB (ref 101–111)
CO2: 33 mmol/L — ABNORMAL HIGH (ref 22–32)
CREATININE: 1.07 mg/dL — AB (ref 0.44–1.00)
Calcium: 9.4 mg/dL (ref 8.9–10.3)
GFR, EST NON AFRICAN AMERICAN: 55 mL/min — AB (ref >60)
Glucose, Bld: 106 mg/dL — ABNORMAL HIGH (ref 65–99)
POTASSIUM: 2.8 mmol/L — AB (ref 3.5–5.1)
SODIUM: 138 mmol/L (ref 135–145)

## 2015-11-02 LAB — URINALYSIS COMPLETE WITH MICROSCOPIC (ARMC ONLY)
BILIRUBIN URINE: NEGATIVE
Glucose, UA: NEGATIVE mg/dL
HGB URINE DIPSTICK: NEGATIVE
Ketones, ur: NEGATIVE mg/dL
NITRITE: NEGATIVE
PH: 6 (ref 5.0–8.0)
Protein, ur: NEGATIVE mg/dL
SPECIFIC GRAVITY, URINE: 1.01 (ref 1.005–1.030)

## 2015-11-02 LAB — CBC
HEMATOCRIT: 41.5 % (ref 35.0–47.0)
Hemoglobin: 14.3 g/dL (ref 12.0–16.0)
MCH: 32.2 pg (ref 26.0–34.0)
MCHC: 34.6 g/dL (ref 32.0–36.0)
MCV: 93.3 fL (ref 80.0–100.0)
PLATELETS: 219 10*3/uL (ref 150–440)
RBC: 4.45 MIL/uL (ref 3.80–5.20)
RDW: 15.2 % — ABNORMAL HIGH (ref 11.5–14.5)
WBC: 7.9 10*3/uL (ref 3.6–11.0)

## 2015-11-02 MED ORDER — CEPHALEXIN 500 MG PO CAPS: 500.0000 mg | ORAL_CAPSULE | Freq: Two times a day (BID) | ORAL | 0 refills | Status: DC

## 2015-11-02 MED ORDER — CEPHALEXIN 500 MG PO CAPS
500.0000 mg | ORAL_CAPSULE | Freq: Once | ORAL | Status: AC
  Administered 2015-11-02: 500 mg via ORAL
  Filled 2015-11-02: qty 1

## 2015-11-02 MED ORDER — HYDROCODONE-ACETAMINOPHEN 5-325 MG PO TABS
1.0000 | ORAL_TABLET | Freq: Once | ORAL | Status: AC
  Administered 2015-11-02: 1 via ORAL
  Filled 2015-11-02: qty 1

## 2015-11-02 MED ORDER — POTASSIUM CHLORIDE CRYS ER 20 MEQ PO TBCR
40.0000 meq | EXTENDED_RELEASE_TABLET | Freq: Once | ORAL | Status: AC
  Administered 2015-11-02: 40 meq via ORAL
  Filled 2015-11-02: qty 2

## 2015-11-02 MED ORDER — IBUPROFEN 600 MG PO TABS
600.0000 mg | ORAL_TABLET | Freq: Once | ORAL | Status: AC
  Administered 2015-11-02: 600 mg via ORAL
  Filled 2015-11-02: qty 1

## 2015-11-02 NOTE — ED Provider Notes (Addendum)
[facility] Emergency Department Provider Note ____________________________________________   I have reviewed the triage vital signs and the triage nursing note.  HISTORY  Chief Complaint Fall; Hand Pain; and Head Injury   Historian Patient  HPI Maria Wheeler is a 60 y.o. 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 She states lives at home and was going to the bathroom when she lost her balance and fell over into the tub. She states that she struck the back of her head and has some pain there, and has some mild neck discomfort. She fell onto her left hand and has some pain at the metacarpal phalangeal joint of the second and third finger. Pain is mild to moderate. Denies urinary symptoms. Denies coughing or trouble breathing. Denies recent congestion or fevers.    Past Medical History:  Diagnosis Date  . Anxiety   . 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 Charcot-Marie disease   . Chronic anxiety 12/14/2011  . Diastolic dysfunction 12/14/2011   Grade 1. Ejection fraction 65-70%.  . Drug withdrawal (HCC) 12/12/2011   Causing confusion, out at her hallucinations, etc. Resolved with Xanax was restarted.  . Hypertension     Patient Active Problem List   Diagnosis Date Noted  . 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 Chronic anxiety 12/14/2011  . Diastolic dysfunction 12/14/2011  . UTI (urinary tract infection) 12/13/2011  . Psychosis 12/12/2011  . Drug withdrawal (HCC) 12/12/2011  . Altered mental status 12/11/2011  . Abnormal EKG 12/11/2011  . Syncope 12/11/2011  . Hypokalemia 12/11/2011  . HTN (hypertension), benign 12/11/2011  . Auditory hallucinations 12/11/2011  . History of drug dependence (HCC) 12/11/2011  . Dehydration 12/11/2011    Past Surgical History:  Procedure Laterality Date  . ABDOMINAL HYSTERECTOMY       Prior to Admission medications   Medication Sig Start Date End Date Taking? Authorizing Provider  ALPRAZolam Prudy Feeler(XANAX) 1 MG tablet Take 1 tablet (1 mg total) by mouth 4 (four) times daily. 12/14/11   Elliot Cousinenise Fisher, MD  atenolol (TENORMIN) 50 MG tablet Take 50 mg by mouth daily.    Historical Provider, MD  cephALEXin (KEFLEX) 500 MG capsule Take 1 capsule (500 mg total) by mouth 2 (two) times daily. 11/02/15   Governor Rooksebecca Avanthika Dehnert, MD  ciprofloxacin (CIPRO) 500 MG tablet Take 1 tablet (500 mg total) by mouth 2 (two) times daily. Antibiotic to be taken for 4 more days. 12/14/11   Elliot Cousinenise Fisher, MD  traZODone (DESYREL) 100 MG tablet Take 0.5-1 tablets by mouth at bedtime. 07/08/14   Historical Provider, MD    Allergies  Allergen Reactions  . Penicillins Rash    No family history on file.  Social History Social History  Substance Use Topics  . Smoking status: Never Smoker  . Smokeless tobacco: Never Used  . Alcohol use 0.6 oz/week    1 Cans of beer per week    Review of Systems  Constitutional: Negative for fever. Eyes: Negative for visual changes. ENT: Negative for sore throat. Cardiovascular: Negative for chest pain. Respiratory: Negative for shortness of breath. Gastrointestinal: Negative for abdominal pain, vomiting and diarrhea. Genitourinary: Negative for dysuria. Musculoskeletal: Negative for back pain.  Mild neck pain. Left hand pain as per history of present illness Skin: Negative for rash. Neurological: Negative for headache. 10 point Review of Systems otherwise negative ____________________________________________   PHYSICAL EXAM:  VITAL SIGNS: ED  Triage Vitals  Enc Vitals Group     BP 11/02/15 0638 136/85     Pulse Rate 11/02/15 0638 (!) 57     Resp 11/02/15 0638 18     Temp 11/02/15 0638 97.8 F (36.6 C)     Temp Source 11/02/15 0638 Oral     SpO2 11/02/15 0638 97 %     Weight 11/02/15 0635 145 lb (65.8 kg)     Height 11/02/15 0635 5\' 5"  (1.651 m)     Head  Circumference --      Peak Flow --      Pain Score 11/02/15 0635 8     Pain Loc --      Pain Edu? --      Excl. in GC? --      Constitutional: Alert and oriented. Well appearing and in no distress. HEENT   Head: Normocephalic and atraumatic.      Eyes: Conjunctivae are normal. PERRL. Normal extraocular movements.      Ears:         Nose: No congestion/rhinnorhea.   Mouth/Throat: Mucous membranes are moist.   Neck: No stridor.  Mild soreness of the paraspinous muscles, no step offs. Cardiovascular/Chest: Normal rate, regular rhythm.  No murmurs, rubs, or gallops. Respiratory: Normal respiratory effort without tachypnea nor retractions. Breath sounds are clear and equal bilaterally. No wheezes/rales/rhonchi. Gastrointestinal: Soft. No distention, no guarding, no rebound. Nontender.  Genitourinary/rectal:Deferred Musculoskeletal: Pelvis stable. Lower extremities nontender. Left hand some tenderness at the first and second metacarpal carpal phalangeal joint. Neurologic:  Normal speech and language. No gross or focal neurologic deficits are appreciated. Skin:  Skin is warm, dry and intact. No rash noted. Psychiatric: Mood and affect are normal. Speech and behavior are normal. Patient exhibits appropriate insight and judgment.   ____________________________________________  LABS (pertinent positives/negatives)  Labs Reviewed  BASIC METABOLIC PANEL - Abnormal; Notable for the following:       Result Value   Potassium 2.8 (*)    Chloride 99 (*)    CO2 33 (*)    Glucose, Bld 106 (*)    Creatinine, Ser 1.07 (*)    GFR calc non Af Amer 55 (*)    All other components within normal limits  CBC - Abnormal; Notable for the following:    RDW 15.2 (*)    All other components within normal limits  URINALYSIS COMPLETEWITH MICROSCOPIC ([facility] ONLY) - Abnormal; Notable for the following:    Color, Urine YELLOW (*)    APPearance CLOUDY (*)    Leukocytes, UA 1+ (*)    Bacteria, UA  RARE (*)    Squamous Epithelial / LPF 0-5 (*)    All other components within normal limits  URINE CULTURE  CBG MONITORING, ED    ____________________________________________    EKG I, Governor Rooks, MD, the attending physician have personally viewed and interpreted all ECGs.  None ____________________________________________  RADIOLOGY All Xrays were viewed by me. Imaging interpreted by Radiologist.  CT head and cervical spine without contrast:  IMPRESSION: 1. No evidence of significant acute traumatic injury to the skull, brain or cervical spine. 2. No acute intracranial abnormalities. 3. 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 4. Mild multilevel degenerative disc disease and cervical Spondylosis.  Left hand complete x-ray: Negative __________________________________________  PROCEDURES  Procedure(s) performed: Left short arm volar splint placed by  tech/nurse.  Critical Care performed: None  ____________________________________________   ED COURSE / ASSESSMENT AND PLAN  Pertinent labs & imaging results that were available during my care of the patient were reviewed by me and considered in my medical decision making (see chart for details).   Maria Wheeler was evaluated after a fall, no serious traumatic injury was found on examination and imaging studies.  Her main complaint is headache posteriorly where she struck her head. She states she typically takes ibuprofen, but pain hurts worse than that, and so I did give her 1 dose of Norco and 1 dose of ibuprofen.  In terms of the cause of the fall, it certainly sounds like consistent with her prior issues with balance. Her laboratory studies show mild pulmonary creatinine, and she states that she is able to take by mouth and I will let her  rehydrate by mouth. She was given potassium supplement. She states that she has issues with low potassium. I asked her about providing a urine sample to make sure she is not having a urinary tract infection which could be contributory and off balance.  I did discuss with her that I am not going to prescribe narcotic medication for contusion pain. It asked her to continue with over-the-counter Tylenol and/or ibuprofen, and consider ice.  Her friend indicates she is a little concerned about her staying at home, and I too think that the progression of her chronic balance problem and falls may indicate that she should consider assisted living. I asked her to follow up with the primary care doctor this week to recheck her pain, discuss chronic pain management, and it may be discussed home health of or even other living situations including assisted living. She seems comfortable with this plan.  Patient had requested narcotic pain medication, but asked her to stick with over-the-counter anti-inflammatory medications. I did offer to place a splint, and she would like to do that to help her hand be protected from movements that make it hurt.  CONSULTATIONS: None   Patient / Family / Caregiver informed of clinical course, medical decision-making process, and agree with plan.   I discussed return precautions, follow-up instructions, and discharge instructions with patient and/or family.   ___________________________________________   FINAL CLINICAL IMPRESSION(S) / ED DIAGNOSES   Final diagnoses:  Injury of head, initial encounter  Hypokalemia  Urinary tract infection without hematuria, site unspecified  Contusion of left hand, initial encounter              Note: This dictation was prepared with Dragon dictation. Any transcriptional errors that result from this process are unintentional    Governor Rooks, MD 11/02/15 1035    Governor Rooks, MD 11/02/15 [phone]

## 2015-11-02 NOTE — Discharge Instructions (Addendum)
You were evaluated after a fall which I suspect is due to your chronic balance issues, and were found to have urinary tract infection and are being prescribed antibiotic Keflex.  Please also take over the counter pro-biotic to help gut bacteria recover from antibiotic use.  Return to the emergency department for any worsening symptoms including any new injuries, certainly any weakness or numbness, confusion or altered mental status.  Please follow up with your primary care doctor.

## 2015-11-02 NOTE — ED Triage Notes (Signed)
Patient brought in by ems from home for fall. Patient became dizzy and fell. Patient hit head, denies LOC. Patient with pain to left fingers. Patient states that she has not been feeling like herself over the past week.

## 2015-11-03 LAB — URINE CULTURE

## 2015-12-04 ENCOUNTER — Emergency Department
Admission: EM | Admit: 2015-12-04 | Discharge: 2015-12-04 | Disposition: A | Discharge status: Home / Self Care | Payer: Medicare Other | Attending: Emergency Medicine | Admitting: Emergency Medicine

## 2015-12-04 ENCOUNTER — Encounter: Payer: Self-pay | Admitting: Emergency Medicine

## 2015-12-04 ENCOUNTER — Emergency Department: Payer: Medicare Other

## 2015-12-04 DIAGNOSIS — Z79899 Other long term (current) drug therapy: Secondary | ICD-10-CM | POA: Insufficient documentation

## 2015-12-04 DIAGNOSIS — Y9301 Activity, walking, marching and hiking: Secondary | ICD-10-CM | POA: Insufficient documentation

## 2015-12-04 DIAGNOSIS — M545 Low back pain, unspecified: Secondary | ICD-10-CM

## 2015-12-04 DIAGNOSIS — S0083XA Contusion of other part of head, initial encounter: Secondary | ICD-10-CM | POA: Insufficient documentation

## 2015-12-04 DIAGNOSIS — S8001XA Contusion of right knee, initial encounter: Secondary | ICD-10-CM | POA: Diagnosis not present

## 2015-12-04 DIAGNOSIS — Z792 Long term (current) use of antibiotics: Secondary | ICD-10-CM | POA: Diagnosis not present

## 2015-12-04 DIAGNOSIS — I10 Essential (primary) hypertension: Secondary | ICD-10-CM | POA: Insufficient documentation

## 2015-12-04 DIAGNOSIS — Y999 Unspecified external cause status: Secondary | ICD-10-CM | POA: Insufficient documentation

## 2015-12-04 DIAGNOSIS — R296 Repeated falls: Secondary | ICD-10-CM | POA: Diagnosis not present

## 2015-12-04 DIAGNOSIS — Y929 Unspecified place or not applicable: Secondary | ICD-10-CM | POA: Insufficient documentation

## 2015-12-04 DIAGNOSIS — S0990XA Unspecified injury of head, initial encounter: Secondary | ICD-10-CM | POA: Diagnosis present

## 2015-12-04 DIAGNOSIS — W1839XA Other fall on same level, initial encounter: Secondary | ICD-10-CM | POA: Diagnosis not present

## 2015-12-04 DIAGNOSIS — W19XXXA Unspecified fall, initial encounter: Secondary | ICD-10-CM

## 2015-12-04 LAB — CBC
HCT: 34.5 % — ABNORMAL LOW (ref 35.0–47.0)
Hemoglobin: 11.9 g/dL — ABNORMAL LOW (ref 12.0–16.0)
MCH: 32.3 pg (ref 26.0–34.0)
MCHC: 34.6 g/dL (ref 32.0–36.0)
MCV: 93.5 fL (ref 80.0–100.0)
PLATELETS: 258 10*3/uL (ref 150–440)
RBC: 3.69 MIL/uL — ABNORMAL LOW (ref 3.80–5.20)
RDW: 16.1 % — AB (ref 11.5–14.5)
WBC: 9.5 10*3/uL (ref 3.6–11.0)

## 2015-12-04 LAB — BASIC METABOLIC PANEL
Anion gap: 8 (ref 5–15)
BUN: 13 mg/dL (ref 6–20)
CALCIUM: 8.9 mg/dL (ref 8.9–10.3)
CO2: 24 mmol/L (ref 22–32)
CREATININE: 1.29 mg/dL — AB (ref 0.44–1.00)
Chloride: 108 mmol/L (ref 101–111)
GFR calc Af Amer: 51 mL/min — ABNORMAL LOW (ref >60)
GFR, EST NON AFRICAN AMERICAN: 44 mL/min — AB (ref >60)
GLUCOSE: 95 mg/dL (ref 65–99)
POTASSIUM: 3.3 mmol/L — AB (ref 3.5–5.1)
SODIUM: 140 mmol/L (ref 135–145)

## 2015-12-04 LAB — URINALYSIS COMPLETE WITH MICROSCOPIC (ARMC ONLY)
BILIRUBIN URINE: NEGATIVE
Glucose, UA: NEGATIVE mg/dL
HGB URINE DIPSTICK: NEGATIVE
Ketones, ur: NEGATIVE mg/dL
NITRITE: NEGATIVE
Protein, ur: NEGATIVE mg/dL
SPECIFIC GRAVITY, URINE: 1.008 (ref 1.005–1.030)
pH: 5 (ref 5.0–8.0)

## 2015-12-04 LAB — GLUCOSE, CAPILLARY: GLUCOSE-CAPILLARY: 74 mg/dL (ref 65–99)

## 2015-12-04 MED ORDER — NAPROXEN 500 MG PO TABS: 500.0000 mg | ORAL_TABLET | Freq: Two times a day (BID) | ORAL | 0 refills | Status: DC

## 2015-12-04 NOTE — ED Notes (Signed)
Patient transported to CT 

## 2015-12-04 NOTE — ED Provider Notes (Signed)
[facility] Emergency Department Provider Note  ____________________________________________  Time seen: Approximately 11:46 AM  I have reviewed the triage vital signs and the nursing notes.   HISTORY  Chief Complaint Fall    HPI Maria Wheeler is a 60 y.o. female who complains of low back pain and right knee pain after multiple falls. She has Charcot-Marie-Tooth and has been falling frequently for a long time. She is followed up with primary care and neurology but unfortunately, as the patient is aware, this is a chronic issue without any treatment or care. Her outpatient providers have contemplated referring her to pain management as they've had a difficult time controlling her symptoms and the patient has not been satisfied with Lyrica. She has been taking Xanax 4 times a day as well, and agrees that she seems to fall more after she takes Xanax.  She denies syncope she states that when she is walking around and suddenly her legs just give out. No new bowel or bladder incontinence. No paresthesias in the lower extremities.   Past Medical History:  Diagnosis Date  . Anxiety   . 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 Charcot-Marie disease   . Chronic anxiety 12/14/2011  . Diastolic dysfunction 12/14/2011   Grade 1. Ejection fraction 65-70%.  . Drug withdrawal (HCC) 12/12/2011   Causing confusion, out at her hallucinations, etc. Resolved with Xanax was restarted.  . Hypertension      Patient Active Problem List   Diagnosis Date Noted  . 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 Chronic anxiety 12/14/2011  . Diastolic dysfunction 12/14/2011  . UTI (urinary tract infection) 12/13/2011  . Psychosis 12/12/2011  . Drug withdrawal (HCC) 12/12/2011  . Altered mental status 12/11/2011  . Abnormal EKG 12/11/2011  . Syncope 12/11/2011  . Hypokalemia 12/11/2011  . HTN (hypertension), benign 12/11/2011  . Auditory hallucinations 12/11/2011  .  History of drug dependence (HCC) 12/11/2011  . Dehydration 12/11/2011     Past Surgical History:  Procedure Laterality Date  . ABDOMINAL HYSTERECTOMY       Prior to Admission medications   Medication Sig Start Date End Date Taking? Authorizing Provider  atenolol (TENORMIN) 50 MG tablet Take 50 mg by mouth daily.    Historical Provider, MD  cephALEXin (KEFLEX) 500 MG capsule Take 1 capsule (500 mg total) by mouth 2 (two) times daily. 11/02/15   Governor Rooksebecca Lord, MD  ciprofloxacin (CIPRO) 500 MG tablet Take 1 tablet (500 mg total) by mouth 2 (two) times daily. Antibiotic to be taken for 4 more days. 12/14/11   Elliot Cousinenise Fisher, MD  naproxen (NAPROSYN) 500 MG tablet Take 1 tablet (500 mg total) by mouth 2 (two) times daily with a meal. 12/04/15   Sharman CheekPhillip Ziad Maye, MD  traZODone (DESYREL) 100 MG tablet Take 0.5-1 tablets by mouth at bedtime. 07/08/14   Historical Provider, MD     Allergies Penicillins   No family history on file.  Social History Social History  Substance Use Topics  . Smoking status: Never Smoker  . Smokeless tobacco: Never Used  . Alcohol use 0.6 oz/week    1 Cans of beer per week    Review of Systems  Constitutional:   No fever or chills.   Cardiovascular:   No chest pain. Respiratory:   No dyspnea or cough. Gastrointestinal:   Negative for abdominal pain, vomiting and diarrhea.  Genitourinary:   Negative for dysuria or difficulty urinating. Musculoskeletal:   Bilateral lower back pain after falling. Right knee pain. Neurological:  Chronic generalized weakness 10-point ROS otherwise negative.  ____________________________________________   PHYSICAL EXAM:  VITAL SIGNS: ED Triage Vitals  Enc Vitals Group     BP 12/04/15 1052 (!) 178/106     Pulse Rate 12/04/15 1052 (!) 56     Resp 12/04/15 1052 15     Temp 12/04/15 1052 97.6 F (36.4 C)     Temp Source 12/04/15 1052 Oral     SpO2 12/04/15 1052 97 %     Weight 12/04/15 1059 140 lb (63.5 kg)     Height  12/04/15 1059 5\' 5"  (1.651 m)     Head Circumference --      Peak Flow --      Pain Score 12/04/15 1100 8     Pain Loc --      Pain Edu? --      Excl. in GC? --     Vital signs reviewed, nursing assessments reviewed.   Constitutional:   Alert and oriented. Well appearing and in no distress. Eyes:   No scleral icterus. No conjunctival pallor. PERRL. EOMI.  No nystagmus. ENT   Head:   Normocephalic With a small amount of swelling over the right forehead..   Nose:   No congestion/rhinnorhea. No septal hematoma   Mouth/Throat:   MMM, no pharyngeal erythema. No peritonsillar mass.    Neck:   No stridor. No SubQ emphysema. No meningismus. Hematological/Lymphatic/Immunilogical:   No cervical lymphadenopathy. Cardiovascular:   RRR. Symmetric bilateral radial and DP pulses.  No murmurs.  Respiratory:   Normal respiratory effort without tachypnea nor retractions. Breath sounds are clear and equal bilaterally. No wheezes/rales/rhonchi. Gastrointestinal:   Soft and nontender. Non distended. There is no CVA tenderness.  No rebound, rigidity, or guarding. Genitourinary:   deferred Musculoskeletal:   No midline spinal tenderness in the CT or L-spine. Bilateral lower back tenderness in the musculature. No focal bony tenderness in the extremities, joints are stable with intact range of motion.. Neurologic:   Slurred speech CN 2-10 normal. Motor grossly intact. No gross focal neurologic deficits are appreciated.  Skin:    Skin is warm, dry and intact. No rash noted.  No petechiae, purpura, or bullae. There is some ecchymosis scattered about the right knee without lacerations.  ____________________________________________    LABS (pertinent positives/negatives) (all labs ordered are listed, but only abnormal results are displayed) Labs Reviewed  BASIC METABOLIC PANEL - Abnormal; Notable for the following:       Result Value   Potassium 3.3 (*)    Creatinine, Ser 1.29 (*)    GFR  calc non Af Amer 44 (*)    GFR calc Af Amer 51 (*)    All other components within normal limits  CBC - Abnormal; Notable for the following:    RBC 3.69 (*)    Hemoglobin 11.9 (*)    HCT 34.5 (*)    RDW 16.1 (*)    All other components within normal limits  URINALYSIS COMPLETEWITH MICROSCOPIC ([facility] ONLY) - Abnormal; Notable for the following:    Color, Urine YELLOW (*)    APPearance CLOUDY (*)    Leukocytes, UA 2+ (*)    Bacteria, UA RARE (*)    Squamous Epithelial / LPF 6-30 (*)    All other components within normal limits  GLUCOSE, CAPILLARY  CBG MONITORING, ED   ____________________________________________   EKG  Interpreted by me Sinus rhythm rate of 56, normal axis intervals QRS ST segments and T waves  ____________________________________________  RADIOLOGY  CT head unremarkable CT cervical spine unremarkable X-ray lumbar spine unremarkable X-ray right knee unremarkable  ____________________________________________   PROCEDURES Procedures  ____________________________________________   INITIAL IMPRESSION / ASSESSMENT AND PLAN / ED COURSE  Pertinent labs & imaging results that were available during my care of the patient were reviewed by me and considered in my medical decision making (see chart for details).  Patient's well-appearing no acute distress. Present with some musculoskeletal pains after multiple falls. The falls are chronic issue for her. She was with a family member who is pressuring her to accept placement in an assisted living facility. However, the patient is strongly opposed to this and wishes to be discharged back to her own home even though she lives there alone. She understands that she has a chronic illness that we'll not be improved and wants to maintain her independence as much as possible. I'll refer her back to primary care after getting a CT scan of her head and neck and x-rays of the back and right knee for further monitoring of her  symptoms and coordination of care and contemplation of assisted living facility injury and referral to pain management. Low suspicion for intracranial hemorrhage or acute infection including meningitis encephalitis or epidural abscess or hematoma.    ----------------------------------------- 1:24 PM on 12/04/2015 -----------------------------------------  Workup unremarkable. No acute injuries. Patient insists on going home and does not want to be placed in an acute rehabilitation or assisted living facility. She ambulates well without following here in the ED. I do think that overall this is her chronic baseline and she may not benefit from rehabilitation placement, but I did encourage her to consider assisted living as a way of maintaining more of her independence for longer and preventing injury. She'll follow up with her primary care.   Clinical Course   ____________________________________________   FINAL CLINICAL IMPRESSION(S) / ED DIAGNOSES  Final diagnoses:  Fall, initial encounter  Contusion of face, initial encounter  Acute bilateral low back pain without sciatica       Portions of this note were generated with dragon dictation software. Dictation errors may occur despite best attempts at proofreading.    Sharman CheekPhillip Kinzee Happel, MD 12/04/15 1328

## 2015-12-04 NOTE — ED Triage Notes (Signed)
Ems pt from home, reported via EMS of 20 to 25 fall in the last 2 weeks, " I have a nerve disorder"  symptoms today were stroke like symptoms of left side weakness, facial droop, slurred speech, EMS reports no above symptoms, "head bleed x1 year ago", CBG 123, BP 146/86, IV 20 gauge

## 2015-12-23 ENCOUNTER — Other Ambulatory Visit: Payer: Self-pay | Admitting: Neurology

## 2015-12-23 DIAGNOSIS — G600 Hereditary motor and sensory neuropathy: Secondary | ICD-10-CM | POA: Diagnosis not present

## 2015-12-23 DIAGNOSIS — M7989 Other specified soft tissue disorders: Secondary | ICD-10-CM | POA: Diagnosis not present

## 2015-12-23 DIAGNOSIS — M79602 Pain in left arm: Secondary | ICD-10-CM

## 2015-12-31 ENCOUNTER — Emergency Department: Payer: Medicare Other

## 2015-12-31 ENCOUNTER — Encounter: Payer: Self-pay | Admitting: Emergency Medicine

## 2015-12-31 ENCOUNTER — Observation Stay
Admission: EM | Admit: 2015-12-31 | Discharge: 2016-01-01 | Disposition: A | Discharge status: Home / Self Care | Payer: Medicare Other | Attending: Internal Medicine | Admitting: Internal Medicine

## 2015-12-31 DIAGNOSIS — N390 Urinary tract infection, site not specified: Secondary | ICD-10-CM | POA: Insufficient documentation

## 2015-12-31 DIAGNOSIS — F419 Anxiety disorder, unspecified: Secondary | ICD-10-CM | POA: Insufficient documentation

## 2015-12-31 DIAGNOSIS — S0993XA Unspecified injury of face, initial encounter: Secondary | ICD-10-CM | POA: Diagnosis not present

## 2015-12-31 DIAGNOSIS — G600 Hereditary motor and sensory neuropathy: Secondary | ICD-10-CM | POA: Diagnosis not present

## 2015-12-31 DIAGNOSIS — N3000 Acute cystitis without hematuria: Secondary | ICD-10-CM

## 2015-12-31 DIAGNOSIS — I10 Essential (primary) hypertension: Secondary | ICD-10-CM | POA: Diagnosis not present

## 2015-12-31 DIAGNOSIS — Z880 Allergy status to penicillin: Secondary | ICD-10-CM | POA: Insufficient documentation

## 2015-12-31 DIAGNOSIS — E876 Hypokalemia: Secondary | ICD-10-CM | POA: Diagnosis not present

## 2015-12-31 DIAGNOSIS — W19XXXA Unspecified fall, initial encounter: Secondary | ICD-10-CM | POA: Insufficient documentation

## 2015-12-31 DIAGNOSIS — R220 Localized swelling, mass and lump, head: Secondary | ICD-10-CM | POA: Diagnosis not present

## 2015-12-31 DIAGNOSIS — R262 Difficulty in walking, not elsewhere classified: Secondary | ICD-10-CM

## 2015-12-31 DIAGNOSIS — R296 Repeated falls: Secondary | ICD-10-CM | POA: Insufficient documentation

## 2015-12-31 DIAGNOSIS — S0990XA Unspecified injury of head, initial encounter: Secondary | ICD-10-CM | POA: Diagnosis not present

## 2015-12-31 LAB — URINALYSIS COMPLETE WITH MICROSCOPIC (ARMC ONLY)
Bilirubin Urine: NEGATIVE
Glucose, UA: NEGATIVE mg/dL
HGB URINE DIPSTICK: NEGATIVE
NITRITE: NEGATIVE
PROTEIN: 30 mg/dL — AB
SPECIFIC GRAVITY, URINE: 1.017 (ref 1.005–1.030)
pH: 6 (ref 5.0–8.0)

## 2015-12-31 LAB — CBC WITH DIFFERENTIAL/PLATELET
BASOS ABS: 0.1 10*3/uL (ref 0–0.1)
Basophils Relative: 2 %
EOS PCT: 2 %
Eosinophils Absolute: 0.2 10*3/uL (ref 0–0.7)
HEMATOCRIT: 39.3 % (ref 35.0–47.0)
Hemoglobin: 13.5 g/dL (ref 12.0–16.0)
LYMPHS PCT: 25 %
Lymphs Abs: 1.9 10*3/uL (ref 1.0–3.6)
MCH: 31.7 pg (ref 26.0–34.0)
MCHC: 34.4 g/dL (ref 32.0–36.0)
MCV: 92.3 fL (ref 80.0–100.0)
Monocytes Absolute: 0.3 10*3/uL (ref 0.2–0.9)
Monocytes Relative: 5 %
NEUTROS ABS: 5 10*3/uL (ref 1.4–6.5)
Neutrophils Relative %: 66 %
PLATELETS: 214 10*3/uL (ref 150–440)
RBC: 4.26 MIL/uL (ref 3.80–5.20)
RDW: 15.4 % — ABNORMAL HIGH (ref 11.5–14.5)
WBC: 7.5 10*3/uL (ref 3.6–11.0)

## 2015-12-31 LAB — COMPREHENSIVE METABOLIC PANEL
ALT: 13 U/L — ABNORMAL LOW (ref 14–54)
ANION GAP: 13 (ref 5–15)
AST: 15 U/L (ref 15–41)
Albumin: 3.8 g/dL (ref 3.5–5.0)
Alkaline Phosphatase: 86 U/L (ref 38–126)
BUN: 7 mg/dL (ref 6–20)
CHLORIDE: 101 mmol/L (ref 101–111)
CO2: 27 mmol/L (ref 22–32)
Calcium: 9.4 mg/dL (ref 8.9–10.3)
Creatinine, Ser: 0.86 mg/dL (ref 0.44–1.00)
Glucose, Bld: 94 mg/dL (ref 65–99)
POTASSIUM: 2.4 mmol/L — AB (ref 3.5–5.1)
Sodium: 141 mmol/L (ref 135–145)
Total Bilirubin: 1 mg/dL (ref 0.3–1.2)
Total Protein: 7.5 g/dL (ref 6.5–8.1)

## 2015-12-31 LAB — CK: CK TOTAL: 36 U/L — AB (ref 38–234)

## 2015-12-31 LAB — TROPONIN I

## 2015-12-31 LAB — MAGNESIUM: MAGNESIUM: 1.9 mg/dL (ref 1.7–2.4)

## 2015-12-31 MED ORDER — ENOXAPARIN SODIUM 40 MG/0.4ML ~~LOC~~ SOLN
40.0000 mg | SUBCUTANEOUS | Status: DC
  Administered 2015-12-31: 40 mg via SUBCUTANEOUS
  Filled 2015-12-31: qty 0.4

## 2015-12-31 MED ORDER — LISINOPRIL 20 MG PO TABS: 100.0000 mg | ORAL_TABLET | Freq: Every day | ORAL | Status: DC

## 2015-12-31 MED ORDER — LISINOPRIL 10 MG PO TABS: 10.0000 mg | ORAL_TABLET | Freq: Every day | ORAL | Status: DC

## 2015-12-31 MED ORDER — SODIUM CHLORIDE 0.9% FLUSH
3.0000 mL | Freq: Two times a day (BID) | INTRAVENOUS | Status: DC
  Administered 2016-01-01: 3 mL via INTRAVENOUS

## 2015-12-31 MED ORDER — ATENOLOL 25 MG PO TABS
50.0000 mg | ORAL_TABLET | Freq: Every day | ORAL | Status: DC
  Administered 2016-01-01: 50 mg via ORAL
  Filled 2015-12-31: qty 2

## 2015-12-31 MED ORDER — ONDANSETRON HCL 4 MG/2ML IJ SOLN: 4.0000 mg | Freq: Four times a day (QID) | INTRAMUSCULAR | Status: DC | PRN

## 2015-12-31 MED ORDER — SULFAMETHOXAZOLE-TRIMETHOPRIM 800-160 MG PO TABS
1.0000 | ORAL_TABLET | Freq: Once | ORAL | Status: AC
  Administered 2015-12-31: 1 via ORAL
  Filled 2015-12-31: qty 1

## 2015-12-31 MED ORDER — SULFAMETHOXAZOLE-TRIMETHOPRIM 800-160 MG PO TABS: 1.0000 | ORAL_TABLET | Freq: Two times a day (BID) | ORAL | 0 refills | Status: DC

## 2015-12-31 MED ORDER — POTASSIUM CHLORIDE IN NACL 20-0.9 MEQ/L-% IV SOLN
Freq: Once | INTRAVENOUS | Status: AC
  Administered 2015-12-31: 1000 mL via INTRAVENOUS
  Filled 2015-12-31: qty 1000

## 2015-12-31 MED ORDER — CEFTRIAXONE SODIUM-DEXTROSE 1-3.74 GM-% IV SOLR
1.0000 g | Freq: Once | INTRAVENOUS | Status: AC
  Administered 2015-12-31: 1 g via INTRAVENOUS
  Filled 2015-12-31: qty 50

## 2015-12-31 MED ORDER — DEXTROSE 5 % IV SOLN: 1.0000 g | Freq: Once | INTRAVENOUS | Status: DC

## 2015-12-31 MED ORDER — ACETAMINOPHEN 325 MG PO TABS
650.0000 mg | ORAL_TABLET | Freq: Four times a day (QID) | ORAL | Status: DC | PRN
  Administered 2015-12-31: 650 mg via ORAL
  Filled 2015-12-31: qty 2

## 2015-12-31 MED ORDER — POTASSIUM CHLORIDE CRYS ER 20 MEQ PO TBCR
60.0000 meq | EXTENDED_RELEASE_TABLET | Freq: Once | ORAL | Status: AC
  Administered 2015-12-31: 60 meq via ORAL
  Filled 2015-12-31: qty 3

## 2015-12-31 MED ORDER — LISINOPRIL 10 MG PO TABS
10.0000 mg | ORAL_TABLET | Freq: Every day | ORAL | Status: DC
Start: 2016-01-01 — End: 2016-01-01
  Administered 2016-01-01: 10 mg via ORAL
  Filled 2015-12-31: qty 1

## 2015-12-31 MED ORDER — HYDRALAZINE HCL 20 MG/ML IJ SOLN
10.0000 mg | INTRAMUSCULAR | Status: DC | PRN
  Administered 2015-12-31: 10 mg via INTRAVENOUS
  Filled 2015-12-31: qty 1

## 2015-12-31 MED ORDER — OXYCODONE HCL 5 MG PO TABS
5.0000 mg | ORAL_TABLET | ORAL | Status: DC | PRN
  Administered 2015-12-31 – 2016-01-01 (×3): 5 mg via ORAL
  Filled 2015-12-31 (×3): qty 1

## 2015-12-31 MED ORDER — POTASSIUM CHLORIDE ER 10 MEQ PO TBCR: 10.0000 meq | EXTENDED_RELEASE_TABLET | Freq: Every day | ORAL | 0 refills | Status: DC

## 2015-12-31 MED ORDER — ACETAMINOPHEN 650 MG RE SUPP: 650.0000 mg | Freq: Four times a day (QID) | RECTAL | Status: DC | PRN

## 2015-12-31 MED ORDER — ONDANSETRON HCL 4 MG PO TABS: 4.0000 mg | ORAL_TABLET | Freq: Four times a day (QID) | ORAL | Status: DC | PRN

## 2015-12-31 MED ORDER — POTASSIUM CHLORIDE IN NACL 20-0.9 MEQ/L-% IV SOLN
INTRAVENOUS | Status: DC
  Administered 2015-12-31 – 2016-01-01 (×2): via INTRAVENOUS
  Filled 2015-12-31 (×4): qty 1000

## 2015-12-31 NOTE — ED Triage Notes (Signed)
[facility]  Pt reports falling "two days ago" but is unable to recall mechanism of fall.  Pt with excoriation and large hematoma to right eye.  Pt denies any changes to vision, pt does report dizziness since the fall.  Pt A/Ox4, hypertensive upon arrival, no immediate distress at this time.

## 2015-12-31 NOTE — Progress Notes (Signed)
Pt arrived on the unit, Alert X4. Current BP 160/85. Central telemetry set up on pt. Floor mats put on both sides of pt.'s bed, bed alarm was placed, phone and call light within reach. Will monitor pt very closely.   Maria Wheeler [place]

## 2015-12-31 NOTE — ED Notes (Signed)
Lab notified to send phlebotomist to collect CBC.

## 2015-12-31 NOTE — H&P (Signed)
[facility] - [place] at [facility]   PATIENT NAME: Maria Wheeler    MR#:  [id number]  DATE OF BIRTH:  November 04, 1955   DATE OF ADMISSION:  12/31/2015  PRIMARY CARE PHYSICIAN: WHITE, [name], NP   REQUESTING/REFERRING PHYSICIAN: Williams  CHIEF COMPLAINT:   Chief Complaint  Patient presents with  . Fall    HISTORY OF PRESENT ILLNESS:  Maria Wheeler  is a 60 y.o. female with a known history of Essential hypertension who is presenting after mechanical fall. She is unable to describe much of the incident however states she is fallen over 15 times in the last 2 days. She did suffer head trauma without loss of consciousness leaving bruising over her right eye  Positive for fatigue and weakness denies any further symptoms including fevers chills Does state she has change in urine color but denies any actual dysuria PAST MEDICAL HISTORY:   Past Medical History:  Diagnosis Date  . Anxiety   . 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 Charcot-Marie disease   . Chronic anxiety 12/14/2011  . Diastolic dysfunction 12/14/2011   Grade 1. Ejection fraction 65-70%.  . Drug withdrawal (HCC) 12/12/2011   Causing confusion, out at her hallucinations, etc. Resolved with Xanax was restarted.  . Hypertension     PAST SURGICAL HISTORY:   Past Surgical History:  Procedure Laterality Date  . ABDOMINAL HYSTERECTOMY      SOCIAL HISTORY:   Social History  Substance Use Topics  . Smoking status: Never Smoker  . Smokeless tobacco: Never Used  . Alcohol use 0.6 oz/week    1 Cans of beer per week    FAMILY HISTORY:   Family History  Problem Relation Age of Onset  . Diabetes Neg Hx     DRUG ALLERGIES:   Allergies  Allergen Reactions  . Penicillins Rash    rash    REVIEW OF SYSTEMS:  REVIEW OF SYSTEMS:  CONSTITUTIONAL: Denies fevers, chills, Positive fatigue, weakness.  EYES: Denies blurred vision, double vision, or eye pain.  EARS, NOSE,  THROAT: Denies tinnitus, ear pain, hearing loss.  RESPIRATORY: denies cough, shortness of breath, wheezing  CARDIOVASCULAR: Denies chest pain, palpitations, edema.  GASTROINTESTINAL: Denies nausea, vomiting, diarrhea, abdominal pain.  GENITOURINARY: Denies dysuria, hematuria.  ENDOCRINE: Denies nocturia or thyroid problems. HEMATOLOGIC AND LYMPHATIC: Denies easy bruising or bleeding.  SKIN: Denies rash or lesions.  MUSCULOSKELETAL: Denies pain in neck, back, shoulder, knees, hips, or further arthritic symptoms.  NEUROLOGIC: Denies paralysis, paresthesias.  PSYCHIATRIC: Denies anxiety or depressive symptoms. Otherwise full review of systems performed by me is negative.   MEDICATIONS AT HOME:   Prior to Admission medications   Medication Sig Start Date End Date Taking? Authorizing Provider  atenolol (TENORMIN) 50 MG tablet Take 50 mg by mouth daily.   Yes Historical Provider, MD  lisinopril (PRINIVIL,ZESTRIL) 10 MG tablet Take 10 tablets by mouth daily. 10/09/15 10/08/16 Yes Historical Provider, MD  potassium chloride (K-DUR) 10 MEQ tablet Take 1 tablet (10 mEq total) by mouth daily. 12/31/15   Emily Filbert, MD  sulfamethoxazole-trimethoprim (BACTRIM DS) 800-160 MG tablet Take 1 tablet by mouth 2 (two) times daily. 12/31/15   Emily Filbert, MD      VITAL SIGNS:  Blood pressure (!) 188/118, pulse 72, temperature 98.4 F (36.9 C), temperature source Oral, resp. rate 13, height 5\' 5"  (1.651 m), weight 68 kg (150 lb), SpO2 99 %.  PHYSICAL EXAMINATION:  VITAL SIGNS: Vitals:   12/31/15 1200 12/31/15 1356  BP: (!) 170/113 (!) 188/118  Pulse: 76 72  Resp: (!) 8 13  Temp:     GENERAL:60 y.o.female currently in no acute distress.  HEAD: Normocephalic, Right periorbital hematoma with small laceration.  EYES: Pupils equal, round, reactive to light. Extraocular muscles intact. No scleral icterus.  MOUTH: Moist mucosal membrane. Dentition intact. No abscess noted.  EAR, NOSE,  THROAT: Clear without exudates. No external lesions.  NECK: Supple. No thyromegaly. No nodules. No JVD.  PULMONARY: Clear to ascultation, without wheeze rails or rhonci. No use of accessory muscles, Good respiratory effort. good air entry bilaterally CHEST: Nontender to palpation.  CARDIOVASCULAR: S1 and S2. Regular rate and rhythm. No murmurs, rubs, or gallops. No edema. Pedal pulses 2+ bilaterally.  GASTROINTESTINAL: Soft, nontender, nondistended. No masses. Positive bowel sounds. No hepatosplenomegaly.  MUSCULOSKELETAL: No swelling, clubbing, or edema. Range of motion full in all extremities.  NEUROLOGIC: Cranial nerves II through XII are intact. No gross focal neurological deficits. Sensation intact. Reflexes intact.  SKIN: No ulceration, lesions, rashes, or cyanosis. Skin warm and dry. Turgor intact.  PSYCHIATRIC: Mood, affect within normal limits. Speech pattern slow The patient is awake, alert and oriented x 3. Insight, judgment intact.    LABORATORY PANEL:   CBC  Recent Labs Lab 12/31/15 1222  WBC 7.5  HGB 13.5  HCT 39.3  PLT 214   ------------------------------------------------------------------------------------------------------------------  Chemistries   Recent Labs Lab 12/31/15 1110  NA 141  K 2.4*  CL 101  CO2 27  GLUCOSE 94  BUN 7  CREATININE 0.86  CALCIUM 9.4  AST 15  ALT 13*  ALKPHOS 86  BILITOT 1.0   ------------------------------------------------------------------------------------------------------------------  Cardiac Enzymes  Recent Labs Lab 12/31/15 1110  TROPONINI <0.03   ------------------------------------------------------------------------------------------------------------------  RADIOLOGY:  Ct Head Wo Contrast  Result Date: 12/31/2015 CLINICAL DATA:  60 year old with fall and head/face injury. EXAM: CT HEAD WITHOUT CONTRAST CT MAXILLOFACIAL WITHOUT CONTRAST TECHNIQUE: Multidetector CT imaging of the head and maxillofacial  structures were performed using the standard protocol without intravenous contrast. Multiplanar CT image reconstructions of the maxillofacial structures were also generated. COMPARISON:  12/04/2015 FINDINGS: CT HEAD FINDINGS Brain: Stable cerebral atrophy. Diffuse low density throughout the periventricular and subcortical cortical white matter. 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 These old ischemic changes are stable. No evidence for acute hemorrhage, mass lesion, midline shift, hydrocephalus or large new infarct. Vascular: Again noted is ectasia of the right vertebral artery. Skull: No skull fracture. Other: None. CT MAXILLOFACIAL FINDINGS Osseous: No facial bone fracture. The mandibular condyles are located. Mild degenerative disease in the cervical spine at C4-C5. Findings are similar to the previous cervical spine CT. Patient is missing multiple teeth. Crista galli is midline and intact. Mandible is intact. There is an implant or prosthetic device along the maxilla. Appears to be a large dental caries involving the most posterior right upper tooth. Orbits: Both globes are intact. Sinuses: Mastoid air cells and paranasal sinuses are clear. Soft tissues: No significant soft tissue swelling in the upper neck. There is soft tissue swelling in the right periorbital region, particularly in the right forehead. IMPRESSION: No acute intracranial abnormality. Cerebral atrophy with old ischemic changes. Soft tissue swelling in the right forehead and right periorbital region. No facial bone fracture. Electronically Signed   By: Richarda OverlieAdam  Henn M.D.   On: 12/31/2015 12:02   Ct Maxillofacial Wo Contrast  Result Date: 12/31/2015 CLINICAL DATA:  60 year old with fall and head/face injury. EXAM: CT HEAD  WITHOUT CONTRAST CT MAXILLOFACIAL WITHOUT CONTRAST TECHNIQUE: Multidetector CT imaging of the head and maxillofacial structures were performed using the standard protocol without  intravenous contrast. Multiplanar CT image reconstructions of the maxillofacial structures were also generated. COMPARISON:  12/04/2015 FINDINGS: CT HEAD FINDINGS Brain: Stable cerebral atrophy. Diffuse low density throughout the periventricular and subcortical cortical white matter. 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 These old ischemic changes are stable. No evidence for acute hemorrhage, mass lesion, midline shift, hydrocephalus or large new infarct. Vascular: Again noted is ectasia of the right vertebral artery. Skull: No skull fracture. Other: None. CT MAXILLOFACIAL FINDINGS Osseous: No facial bone fracture. The mandibular condyles are located. Mild degenerative disease in the cervical spine at C4-C5. Findings are similar to the previous cervical spine CT. Patient is missing multiple teeth. Crista galli is midline and intact. Mandible is intact. There is an implant or prosthetic device along the maxilla. Appears to be a large dental caries involving the most posterior right upper tooth. Orbits: Both globes are intact. Sinuses: Mastoid air cells and paranasal sinuses are clear. Soft tissues: No significant soft tissue swelling in the upper neck. There is soft tissue swelling in the right periorbital region, particularly in the right forehead. IMPRESSION: No acute intracranial abnormality. Cerebral atrophy with old ischemic changes. Soft tissue swelling in the right forehead and right periorbital region. No facial bone fracture. Electronically Signed   By: Richarda OverlieAdam  Henn M.D.   On: 12/31/2015 12:02    EKG:   Orders placed or performed during the hospital encounter of 12/31/15  . ED EKG  . ED EKG  . EKG 12-Lead  . EKG 12-Lead    IMPRESSION AND PLAN:   60 year old Caucasian female history of essential hypertension presenting after mechanical falls  1. Multiple-recurrent falls: Check vitamin D level, physical therapy evaluation she does require a walker for  ambulation but states "I don't use it at my apartment" 2. Urinary tract infection continue with current antibiotics ceftriaxone 3 doses total follow culture data 3. Hypokalemia replace potassium goal 4-5 check magnesium level is slightly was also low 4. Essential hypertension continue with lisinopril and atenolol at as needed hydralazine     All the records are reviewed and case discussed with ED provider. Management plans discussed with the patient, family and they are in agreement.  CODE STATUS: Full  TOTAL TIME TAKING CARE OF THIS PATIENT: 33 minutes.    Jadence Kinlaw,  Mardi MainlandDavid K M.D on 12/31/2015 at 2:51 PM  Between 7am to 6pm - Pager - [phone]  After 6pm: House Pager: - [phone]  [facility] [place] [facility]  Office  [phone]  CC: Primary care physician; WHITE, Arlyss RepressELIZABETH BURNEY, NP

## 2015-12-31 NOTE — ED Notes (Signed)
Patient transported to CT 

## 2015-12-31 NOTE — ED Provider Notes (Addendum)
[facility] Emergency Department Provider Note        Time seen: ----------------------------------------- 10:36 AM on 12/31/2015 -----------------------------------------    I have reviewed the triage vital signs and the nursing notes.   HISTORY  Chief Complaint Fall    HPI Maria Wheeler is a 60 y.o. female who [facility] Patient reports falling several times but cannot recall the mechanism. Patient states she has Charcot Hilda LiasMarie tooth and has had falls secondary to same. She presents with right frontal scalp hematoma and states her landlord was concerned about her due to frequent falls. She is not concerned about her falls, states they are a frequent problem for her. She denies any changes to her vision, does report some dizziness. She denies recent illness or changes in her medicines.   Past Medical History:  Diagnosis Date  . Anxiety   . 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 Charcot-Marie disease   . Chronic anxiety 12/14/2011  . Diastolic dysfunction 12/14/2011   Grade 1. Ejection fraction 65-70%.  . Drug withdrawal (HCC) 12/12/2011   Causing confusion, out at her hallucinations, etc. Resolved with Xanax was restarted.  . Hypertension     Patient Active Problem List   Diagnosis Date Noted  . 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 Chronic anxiety 12/14/2011  . Diastolic dysfunction 12/14/2011  . UTI (urinary tract infection) 12/13/2011  . Psychosis 12/12/2011  . Drug withdrawal (HCC) 12/12/2011  . Altered mental status 12/11/2011  . Abnormal EKG 12/11/2011  . Syncope 12/11/2011  . Hypokalemia 12/11/2011  . HTN (hypertension), benign 12/11/2011  . Auditory hallucinations 12/11/2011  . History of drug dependence (HCC) 12/11/2011  . Dehydration 12/11/2011    Past Surgical History:  Procedure Laterality Date  . ABDOMINAL HYSTERECTOMY       Allergies Penicillins  Social History Social History  Substance Use Topics  . Smoking status: Never Smoker  . Smokeless tobacco: Never Used  . Alcohol use 0.6 oz/week    1 Cans of beer per week    Review of Systems Constitutional: Negative for fever. Cardiovascular: Negative for chest pain. Respiratory: Negative for shortness of breath. Gastrointestinal: Negative for abdominal pain, vomiting and diarrhea. Genitourinary: Negative for dysuria. Musculoskeletal: Negative for back pain. Skin: Positive for hematoma, contusions Neurological: Negative for headaches, focal weakness or numbness. Positive for dizziness  10-point ROS otherwise negative.  ____________________________________________   PHYSICAL EXAM:  VITAL SIGNS: ED Triage Vitals  Enc Vitals Group     BP 12/31/15 1032 (!) 182/110     Pulse Rate 12/31/15 1032 84     Resp --      Temp 12/31/15 1032 98.4 F (36.9 C)     Temp Source 12/31/15 1032 Oral     SpO2 12/31/15 1032 99 %     Weight 12/31/15 1033 150 lb (68 kg)     Height 12/31/15 1033 5\' 5"  (1.651 m)     Head Circumference --      Peak Flow --      Pain Score 12/31/15 1033 0     Pain Loc --      Pain Edu? --      Excl. in GC? --     Constitutional: Alert and oriented. Well appearing and in no distress. Eyes: Conjunctivae are normal. PERRL. Normal extraocular movements.Periorbital ecchymosis ENT   Head: Right frontal scalp hematoma and abrasion   Nose: No congestion/rhinnorhea.   Mouth/Throat: Mucous membranes are moist.  Neck: No stridor. Cardiovascular: Normal rate, regular rhythm. No murmurs, rubs, or gallops. Respiratory: Normal respiratory effort without tachypnea nor retractions. Breath sounds are clear and equal bilaterally. No wheezes/rales/rhonchi. Gastrointestinal: Soft and nontender. Normal bowel sounds Musculoskeletal: Nontender with normal range of motion in all extremities. No lower extremity tenderness nor  edema. Neurologic:  Somewhat slurred speech, paresthesias in both legs. Otherwise no gross focal neurologic deficits are appreciated.  Skin:  Right frontal scalp hematoma with abrasion, ecchymosis scattered throughout the body Psychiatric: Mood and affect are normal. Speech and behavior are normal.  ____________________________________________  ED COURSE:  Pertinent labs & imaging results that were available during my care of the patient were reviewed by me and considered in my medical decision making (see chart for details). Clinical Course   Patient presents the ER status post fall, she states she has had frequent falls. We will assess with labs and imaging.  EKG: Interpreted by me, sinus rhythm rate of 76 bpm, normal PR interval, normal QRS, long QT, repolarization abnormality  Procedures ____________________________________________   LABS (pertinent positives/negatives)  Labs Reviewed  URINALYSIS COMPLETEWITH MICROSCOPIC ([facility] ONLY) - Abnormal; Notable for the following:       Result Value   Color, Urine AMBER (*)    APPearance CLOUDY (*)    Ketones, ur 1+ (*)    Protein, ur 30 (*)    Leukocytes, UA 1+ (*)    Bacteria, UA MANY (*)    Squamous Epithelial / LPF 6-30 (*)    All other components within normal limits  CK - Abnormal; Notable for the following:    Total CK 36 (*)    All other components within normal limits  COMPREHENSIVE METABOLIC PANEL - Abnormal; Notable for the following:    Potassium 2.4 (*)    ALT 13 (*)    All other components within normal limits  CBC WITH DIFFERENTIAL/PLATELET - Abnormal; Notable for the following:    RDW 15.4 (*)    All other components within normal limits  URINE CULTURE  TROPONIN I  CBC WITH DIFFERENTIAL/PLATELET    RADIOLOGY Images were viewed by me  CT head, maxillofacial IMPRESSION: No acute intracranial abnormality.  Cerebral atrophy with old ischemic changes.  Soft tissue swelling in the right forehead and right  periorbital region. No facial bone fracture.   ____________________________________________  FINAL ASSESSMENT AND PLAN  Fall, minor head injury, Hypokalemia, UTI  Plan: Patient with labs and imaging as dictated above. Patient is leaving the hospital AGAINST MEDICAL ADVICE. I have advised she needs to be admitted due to frequent falls, head injury, UTI, hypokalemia. Patient has refused admission. I have given her oral potassium and antibiotics. I have advised her to seek medical care as soon as possible and be admitted.   Emily FilbertWilliams, Jonathan E, MD   Note: This dictation was prepared with Dragon dictation. Any transcriptional errors that result from this process are unintentional    Emily FilbertJonathan E Williams, MD 12/31/15 1323    Emily FilbertJonathan E Williams, MD 12/31/15 [phone]

## 2015-12-31 NOTE — ED Notes (Signed)
Lab at bedside to collect CBC at this time.

## 2016-01-01 DIAGNOSIS — I10 Essential (primary) hypertension: Secondary | ICD-10-CM | POA: Diagnosis not present

## 2016-01-01 DIAGNOSIS — R296 Repeated falls: Secondary | ICD-10-CM | POA: Diagnosis not present

## 2016-01-01 DIAGNOSIS — N390 Urinary tract infection, site not specified: Secondary | ICD-10-CM | POA: Diagnosis not present

## 2016-01-01 DIAGNOSIS — E876 Hypokalemia: Secondary | ICD-10-CM | POA: Diagnosis not present

## 2016-01-01 LAB — BASIC METABOLIC PANEL
Anion gap: 6 (ref 5–15)
BUN: 9 mg/dL (ref 6–20)
CALCIUM: 8.4 mg/dL — AB (ref 8.9–10.3)
CO2: 26 mmol/L (ref 22–32)
CREATININE: 0.95 mg/dL (ref 0.44–1.00)
Chloride: 109 mmol/L (ref 101–111)
GFR calc Af Amer: >60 mL/min (ref >60)
GLUCOSE: 102 mg/dL — AB (ref 65–99)
Potassium: 3 mmol/L — ABNORMAL LOW (ref 3.5–5.1)
Sodium: 141 mmol/L (ref 135–145)

## 2016-01-01 LAB — VITAMIN D 25 HYDROXY (VIT D DEFICIENCY, FRACTURES): VIT D 25 HYDROXY: 13.5 ng/mL — AB (ref 30.0–100.0)

## 2016-01-01 MED ORDER — HYDRALAZINE HCL 25 MG PO TABS: 25.0000 mg | ORAL_TABLET | Freq: Three times a day (TID) | ORAL | Status: DC

## 2016-01-01 MED ORDER — POTASSIUM CHLORIDE ER 10 MEQ PO TBCR: 10.0000 meq | EXTENDED_RELEASE_TABLET | Freq: Every day | ORAL | 0 refills | Status: DC

## 2016-01-01 MED ORDER — HYDRALAZINE HCL 25 MG PO TABS: 25.0000 mg | ORAL_TABLET | Freq: Three times a day (TID) | ORAL | 0 refills | Status: DC

## 2016-01-01 NOTE — Progress Notes (Signed)
Patient ride called to pick her up and patient friend will pick up prescription from [facility] and patient instructed to take a dose of potassium today when she gets her prescription filled. Patient is alert and oriented ambulates with assistance alert and oriented denies pain at this time. Ride outside and patient taken out to awaiting car.

## 2016-01-01 NOTE — Evaluation (Addendum)
Physical Therapy Evaluation Patient Details Name: Maria NewcomerRebecca Laws Wheeler MRN: [id number] DOB: 05-Jul-1955 Today's Date: 01/01/2016   History of Present Illness  presented to ER and admitted under observation after mechanical fall in home environment (+ head trauma, no LOC); endorses frequent falls over recent 2 days.  Noted with UTI upon admission.  Head CT negative for acute neurological change.  Clinical Impression  Upon evaluation, patient alert and oriented to basic information; follows commands, but demonstrates limited insight into deficits and need for assist.  Very perseverative and focused on "going home".  Demonstrates strength and ROM grossly symmetrical and WFL for basic transfers and mobility; no focal weakness noted.  Able to complete bed mobility with mod indep; sit/stand, basic transfers and gait (210') with RW, cga.  Slight sway, veers L with gait trials; able to self-correct with LE step strategy as primary recovery mechanism.  Do recommend continued use of RW for all mobility at this time; patient voiced understanding/agreement.  Would benefit from home safety assessment and skilled PT interventions in her normal, routine environment. Patient repeatedly voices that her functional performance is at/near baseline for her; however, balance deficits certainly evident (especially given history of repeated falls). Would benefit from skilled PT to address above deficits and promote optimal return to PLOF; Recommend transition to HHPT upon discharge from acute hospitalization.      Follow Up Recommendations Home health PT    Equipment Recommendations       Recommendations for Other Services       Precautions / Restrictions Precautions Precautions: Fall      Mobility  Bed Mobility Overal bed mobility: Modified Independent                Transfers Overall transfer level: Needs assistance Equipment used: Rolling walker (2 wheeled) Transfers: Sit to/from Stand Sit to  Stand: Min guard         General transfer comment: cuing for hand placement; multiple attempts to complete full sit/stand from edge of bed surface  Ambulation/Gait Ambulation/Gait assistance: Min guard Ambulation Distance (Feet): 210 Feet Assistive device: Rolling walker (2 wheeled)       General Gait Details: reciprocal stepping pattern with decreased cadence/gait speed; veers L throughout gait trial, but able to self-correct.  Delayed bilat hip/ankle balance strategies; relies heavily on LE step strategy for any balance recovery.  Do recommend continued use of RW at this time (patient voiced understanding/agreement)  Stairs            Wheelchair Mobility    Modified Rankin (Stroke Patients Only)       Balance Overall balance assessment: Needs assistance Sitting-balance support: No upper extremity supported;Feet supported Sitting balance-Leahy Scale: Good     Standing balance support: Bilateral upper extremity supported Standing balance-Leahy Scale: Fair                               Pertinent Vitals/Pain Pain Assessment: No/denies pain    Home Living Family/patient expects to be discharged to:: Private residence Living Arrangements: Alone Available Help at Discharge: Friend(s);Available PRN/intermittently Type of Home: House Home Access: Level entry     Home Layout: One level Home Equipment: Walker - 4 wheels      Prior Function Level of Independence: Independent         Comments: Mod indep for ADLs and household mobility; intermittent use of 4WRW.  does endorse multiple fall history, >15 in past 2-3 days (unknown cause  per patient)     Hand Dominance        Extremity/Trunk Assessment   Upper Extremity Assessment: Overall WFL for tasks assessed           Lower Extremity Assessment: Overall WFL for tasks assessed (grossly at least 4/5 throughout; history of charcot-marie tooth (bilat feet not tested with resistance))          Communication   Communication: No difficulties  Cognition Arousal/Alertness: Awake/alert Behavior During Therapy: WFL for tasks assessed/performed Overall Cognitive Status: History of cognitive impairments - at baseline (oriented to basic information; generally impulsive, limited insight/awareness)                      General Comments      Exercises Other Exercises Other Exercises: Toilet transfer, ambulatory with RW, cga--cuing to maintain RW with patient at all times (tends to step away from); sit/stand from standard toilet height, cga with RW/grab bar (requires UE support from lower seating surfaces)   Assessment/Plan    PT Assessment Patient needs continued PT services  PT Problem List Decreased strength;Decreased activity tolerance;Decreased balance;Decreased mobility;Decreased cognition;Decreased knowledge of use of DME;Decreased safety awareness;Decreased knowledge of precautions          PT Treatment Interventions DME instruction;Gait training;Functional mobility training;Therapeutic activities;Therapeutic exercise;Balance training;Patient/family education    PT Goals (Current goals can be found in the Care Plan section)  Acute Rehab PT Goals Patient Stated Goal: to go home today PT Goal Formulation: With patient Time For Goal Achievement: 01/15/16 Potential to Achieve Goals: Fair    Frequency Min 2X/week   Barriers to discharge Decreased caregiver support      Co-evaluation               End of Session Equipment Utilized During Treatment: Gait belt Activity Tolerance: Patient tolerated treatment well Patient left: in bed;with call bell/phone within reach;with bed alarm set Nurse Communication: Mobility status    Functional Assessment Tool Used: clinical judgement Functional Limitation: Mobility: Walking and moving around Mobility: Walking and Moving Around Current Status ([id number](G8978): At least 20 percent but less than 40 percent impaired, limited  or restricted Mobility: Walking and Moving Around Goal Status [phone]([id number]): At least 1 percent but less than 20 percent impaired, limited or restricted    Time: 1101-1126 PT Time Calculation (min) (ACUTE ONLY): 25 min   Charges:   PT Evaluation $PT Eval Low Complexity: 1 Procedure PT Treatments $Therapeutic Activity: 8-22 mins   PT G Codes:   PT G-Codes **NOT FOR INPATIENT CLASS** Functional Assessment Tool Used: clinical judgement Functional Limitation: Mobility: Walking and moving around Mobility: Walking and Moving Around Current Status ([id number](G8978): At least 20 percent but less than 40 percent impaired, limited or restricted Mobility: Walking and Moving Around Goal Status [phone](G8979): At least 1 percent but less than 20 percent impaired, limited or restricted    Tressy Kunzman H. Manson PasseyBrown, PT, DPT, NCS 01/01/16, 1:35 PM [phone]

## 2016-01-01 NOTE — Progress Notes (Signed)
Patient prepared for discharge and is upset that she was not discharged this morning. Patient needed to be seen by physical therapy and social worker for discharge needs.Patient seen by physical therapy and home health  Patient is alert and oriented and anxious to go home. Home health set up by social worker and waiting for OK to prepare patient for discharge. Patient took out own IV and site clean dry and intact. Patient also took off telemetry and central monitoring called and verified that telemetry was discharged.

## 2016-01-01 NOTE — Care Management (Signed)
Patient admitted from home with multiple falls.  Patient lives at home alone.  Patient states that she has multiple friends who live in the area, and provide transportation when needed.  Son also lives locally for support.  RW in the home for ambulation.  PCP Doristine MangoElizabeth [name].  Pharmacy [facility].  PT has assessed patient and recommended home health PT.  Home health ordered placed for Home health PT and Rn.  Patient offered agency preference.  Patient states that she does not have a preference of agency.  [facility]  RNCM signing off.

## 2016-01-01 NOTE — Care Management Obs Status (Signed)
MEDICARE OBSERVATION STATUS NOTIFICATION   Patient Details  Name: Jarvis NewcomerRebecca Laws Pitkin MRN: [id number] Date of Birth: 25-Nov-1955   Medicare Observation Status Notification Given:  No  Admitted observation less than 24 hours  Chapman FitchBOWEN, Marilynn Ekstein T, RN 01/01/2016, 1:47 PM

## 2016-01-01 NOTE — Clinical Social Work Note (Signed)
CSW was consulted for discharge planning as patient lives alone and has multiple falls. PT has assessed and recommended home health. Rn CM has seen patient and made home arrangements as well as set up home health. Physician is discharging patient today to return home. York SpanielMonica Necole Minassian MSW,LCSW [phone]

## 2016-01-01 NOTE — Progress Notes (Signed)
Voicing wanting to go home today overnight[facility].  Voiced understanding that she's been falling more, recently; Doesn't know why. Windy Carinaurner,Quinlan Vollmer K, RN 7:51 AM 01/01/2016

## 2016-01-01 NOTE — Discharge Summary (Signed)
Maria Wheeler, is a 60 y.o. female  DOB 28-Jul-1955  MRN [id number].  Admission date:  12/31/2015  Admitting Physician  Wyatt Haste, MD  Discharge Date:  01/01/2016   Primary MD  [name], [name], NP  Recommendations for primary care physician for things to follow:   Follow-up with primary doctor in one week    Admission Diagnosis  Hypokalemia [E87.6] Acute cystitis without hematuria [N30.00] Injury of head, initial encounter [S09.90XA] Fall, initial encounter [W19.XXXA]   Discharge Diagnosis  Hypokalemia [E87.6] Acute cystitis without hematuria [N30.00] Injury of head, initial encounter [S09.90XA] Fall, initial encounter [W19.XXXA]    Active Problems:   Multiple falls   UTI (urinary tract infection)   Hypokalemia      Past Medical History:  Diagnosis Date  . Anxiety   . 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 Charcot-Marie disease   . Chronic anxiety 12/14/2011  . Diastolic dysfunction 12/14/2011   Grade 1. Ejection fraction 65-70%.  . Drug withdrawal (HCC) 12/12/2011   Causing confusion, out at her hallucinations, etc. Resolved with Xanax was restarted.  . Hypertension     Past Surgical History:  Procedure Laterality Date  . ABDOMINAL HYSTERECTOMY         History of present illness and  Hospital Course:     Kindly see H&P for history of present illness and admission details, please review complete Labs, Consult reports and Test reports for all details in brief  HPI  from the history and physical done on the day of admission  Admitted for multiple falls, UTI, hypokalemia.  Hospital Course   1 UTI: Started on Rocephin. Urine cultures are pending. But patient is very adamant that she wants to go home. Discharge home with Bactrim. If culture shows something different and we  will call her different antibiotics. #2 multiple falls likely secondary to hypokalemia, UTI: Patient had multiple falls day before yesterday. Physical therapy recommended home health physical therapy, rolling walker.  #3 severe hypokalemia potassium being replaced in IV fluids. 7 improved from 2.4 to 3. 4. Low vitamin D: Patient needs to take vitamin D 50,000 units once a week. #5. Charcot marie tooth disease; sees. Dr. Malvin Johns neurologist. Was given Neurontin, Percocet but she is not started on that yet.referred to AFO device ,which will probably help from rpt falls  Discharge Condition:    Follow UP  Follow-up Information    Schedule an appointment as soon as possible for a visit with [name], [name], NP.   Specialty:  Family Medicine Why:  For recheck Contact information: [address] [place] [address] [phone]             Discharge Instructions  and  Discharge Medications     Discharge Instructions    Face-to-face encounter (required for Medicare/Medicaid patients)    Complete by:  As directed    I Ariaunna Longsworth certify that this patient is under my care and that I, or a nurse practitioner or physician's assistant working with me, had a face-to-face encounter that meets the physician face-to-face encounter requirements with this patient on 01/01/2016. The encounter with the patient was in whole, or in part for the following medical condition(s) which is the primary reason for home health care mutiple falls Hypokalemia UTI Charcot  Marie foot disease   The encounter with the patient was in whole, or in part, for the following medical condition, which is the primary reason for home health care:  whole   I certify  that, based on my findings, the following services are medically necessary home health services:   Physical therapy Nursing     Reason for Medically Necessary Home Health Services:  Skilled Nursing- Teaching of Disease Process/Symptom  Management   My clinical findings support the need for the above services:  Unable to leave home safely without assistance and/or assistive device   Further, I certify that my clinical findings support that this patient is homebound due to:  Unsafe ambulation due to balance issues   Home Health    Complete by:  As directed    To provide the following care/treatments:   PT RN         Medication List    TAKE these medications   atenolol 50 MG tablet Commonly known as:  TENORMIN Take 50 mg by mouth daily.   hydrALAZINE 25 MG tablet Commonly known as:  APRESOLINE Take 1 tablet (25 mg total) by mouth every 8 (eight) hours.   lisinopril 10 MG tablet Commonly known as:  PRINIVIL,ZESTRIL Take 10 tablets by mouth daily.   potassium chloride 10 MEQ tablet Commonly known as:  K-DUR Take 1 tablet (10 mEq total) by mouth daily.   sulfamethoxazole-trimethoprim 800-160 MG tablet Commonly known as:  BACTRIM DS Take 1 tablet by mouth 2 (two) times daily.            Durable Medical Equipment        Start     Ordered   01/01/16 1409  For home use only DME Walker rolling  Once    Question:  Patient needs a walker to treat with the following condition  Answer:  Fall   01/01/16 1408        Diet and Activity recommendation: See Discharge Instructions above   Consults obtained - physical  therapy    Major procedures and Radiology Reports - PLEASE review detailed and final reports for all details, in brief -      Dg Lumbar Spine 2-3 Views  Result Date: 12/04/2015 CLINICAL DATA:  Multiple falls over the last month. Diffuse low back pain. EXAM: LUMBAR SPINE - 2-3 VIEW COMPARISON:  12/25/2007 FINDINGS: No significant malalignment. No evidence of fracture. Mild disc space narrowing L5-S1. Mild lower lumbar facet osteoarthritis. Some aortic atherosclerotic calcification. IMPRESSION: No acute or traumatic finding. Mild lower lumbar degenerative disc disease and degenerative facet  disease. Aortic atherosclerosis. Electronically Signed   By: Paulina Fusi M.D.   On: 12/04/2015 12:02   Ct Head Wo Contrast  Result Date: 12/31/2015 CLINICAL DATA:  60 year old with fall and head/face injury. EXAM: CT HEAD WITHOUT CONTRAST CT MAXILLOFACIAL WITHOUT CONTRAST TECHNIQUE: Multidetector CT imaging of the head and maxillofacial structures were performed using the standard protocol without intravenous contrast. Multiplanar CT image reconstructions of the maxillofacial structures were also generated. COMPARISON:  12/04/2015 FINDINGS: CT HEAD FINDINGS Brain: Stable cerebral atrophy. Diffuse low density throughout the periventricular and subcortical cortical white matter. 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 These old ischemic changes are stable. No evidence for acute hemorrhage, mass lesion, midline shift, hydrocephalus or large new infarct. Vascular: Again noted is ectasia of the right vertebral artery. Skull: No skull fracture. Other: None. CT MAXILLOFACIAL FINDINGS Osseous: No facial bone fracture. The mandibular condyles are located. Mild degenerative disease in the cervical spine at C4-C5. Findings are similar to the previous cervical spine CT. Patient is missing multiple teeth. Crista galli is midline and intact. Mandible is intact. There is  an implant or prosthetic device along the maxilla. Appears to be a large dental caries involving the most posterior right upper tooth. Orbits: Both globes are intact. Sinuses: Mastoid air cells and paranasal sinuses are clear. Soft tissues: No significant soft tissue swelling in the upper neck. There is soft tissue swelling in the right periorbital region, particularly in the right forehead. IMPRESSION: No acute intracranial abnormality. Cerebral atrophy with old ischemic changes. Soft tissue swelling in the right forehead and right periorbital region. No facial bone fracture. Electronically Signed   By: Richarda Overlie M.D.    On: 12/31/2015 12:02   Ct Head Wo Contrast  Result Date: 12/04/2015 CLINICAL DATA:  Left facial weakness and facial droop with slurred speech. Multiple falls in the last 2 weeks. EXAM: CT HEAD WITHOUT CONTRAST CT CERVICAL SPINE WITHOUT CONTRAST TECHNIQUE: Multidetector CT imaging of the head and cervical spine was performed following the standard protocol without intravenous contrast. Multiplanar CT image reconstructions of the cervical spine were also generated. COMPARISON:  11/02/2015 FINDINGS: CT HEAD FINDINGS Brain: Remote lacunar infarcts in the right pons, left lentiform nucleus and head of the left caudate nucleus, left anterior internal capsule, and right lentiform nucleus, not appreciably changed from prior. Otherwise, The brainstem, cerebellum, cerebral peduncles, thalami, basal ganglia, basilar cisterns, and ventricular system appear within normal limits. Periventricular white matter and corona radiata hypodensities favor chronic ischemic microvascular white matter disease. No intracranial hemorrhage, mass lesion, or acute CVA. Vascular: Mildly ectatic basilar artery. Minimal atherosclerotic calcification of the cavernous carotid arteries. Skull: Unremarkable Sinuses/Orbits: Unremarkable Other: Incidental note is made of a tiny amount of gas in the superior ophthalmic vein, probably benign/iatrogenic gas from IV placement CT CERVICAL SPINE FINDINGS Alignment: 2.5 mm degenerative anterolisthesis at C5-6, unchanged. Skull base and vertebrae: Posterior spurring at C6-7, unchanged. Loss of disc height at C6-7. No fracture or acute subluxation identified. Soft tissues and spinal canal: Unremarkable Disc levels:  No impingement identified. Upper chest: Unremarkable Other: No supplemental non-categorized findings. IMPRESSION: 1. No acute intracranial findings or acute cervical spine findings. 2. Remote lacunar infarcts involving the right pons, bilateral lentiform nuclei, and left caudate head. Not changed  from prior. 3. Periventricular white matter and corona radiata hypodensities favor chronic ischemic microvascular white matter disease. 4. Spondylosis and degenerative disc disease most notable at C6-7. 2.5 mm degenerative retrolisthesis at C5-6, unchanged. Electronically Signed   By: Gaylyn Rong M.D.   On: 12/04/2015 12:19   Ct Cervical Spine Wo Contrast  Result Date: 12/04/2015 CLINICAL DATA:  Left facial weakness and facial droop with slurred speech. Multiple falls in the last 2 weeks. EXAM: CT HEAD WITHOUT CONTRAST CT CERVICAL SPINE WITHOUT CONTRAST TECHNIQUE: Multidetector CT imaging of the head and cervical spine was performed following the standard protocol without intravenous contrast. Multiplanar CT image reconstructions of the cervical spine were also generated. COMPARISON:  11/02/2015 FINDINGS: CT HEAD FINDINGS Brain: Remote lacunar infarcts in the right pons, left lentiform nucleus and head of the left caudate nucleus, left anterior internal capsule, and right lentiform nucleus, not appreciably changed from prior. Otherwise, The brainstem, cerebellum, cerebral peduncles, thalami, basal ganglia, basilar cisterns, and ventricular system appear within normal limits. Periventricular white matter and corona radiata hypodensities favor chronic ischemic microvascular white matter disease. No intracranial hemorrhage, mass lesion, or acute CVA. Vascular: Mildly ectatic basilar artery. Minimal atherosclerotic calcification of the cavernous carotid arteries. Skull: Unremarkable Sinuses/Orbits: Unremarkable Other: Incidental note is made of a tiny amount of gas in the superior ophthalmic vein,  probably benign/iatrogenic gas from IV placement CT CERVICAL SPINE FINDINGS Alignment: 2.5 mm degenerative anterolisthesis at C5-6, unchanged. Skull base and vertebrae: Posterior spurring at C6-7, unchanged. Loss of disc height at C6-7. No fracture or acute subluxation identified. Soft tissues and spinal canal:  Unremarkable Disc levels:  No impingement identified. Upper chest: Unremarkable Other: No supplemental non-categorized findings. IMPRESSION: 1. No acute intracranial findings or acute cervical spine findings. 2. Remote lacunar infarcts involving the right pons, bilateral lentiform nuclei, and left caudate head. Not changed from prior. 3. Periventricular white matter and corona radiata hypodensities favor chronic ischemic microvascular white matter disease. 4. Spondylosis and degenerative disc disease most notable at C6-7. 2.5 mm degenerative retrolisthesis at C5-6, unchanged. Electronically Signed   By: Gaylyn RongWalter  Liebkemann M.D.   On: 12/04/2015 12:19   Dg Knee Complete 4 Views Right  Result Date: 12/04/2015 CLINICAL DATA:  Multiple falls over the last month, most recently yesterday. Anterior pain and swelling. EXAM: RIGHT KNEE - COMPLETE 4+ VIEW COMPARISON:  None. FINDINGS: No fracture. Small joint effusion. There is degenerative chondrocalcinosis of the menisci. No joint space narrowing or osteophyte formation. IMPRESSION: No acute traumatic bone finding. Small joint effusion. Degenerative chondrocalcinosis of the menisci. Electronically Signed   By: Paulina FusiMark  Shogry M.D.   On: 12/04/2015 12:03   Ct Maxillofacial Wo Contrast  Result Date: 12/31/2015 CLINICAL DATA:  60 year old with fall and head/face injury. EXAM: CT HEAD WITHOUT CONTRAST CT MAXILLOFACIAL WITHOUT CONTRAST TECHNIQUE: Multidetector CT imaging of the head and maxillofacial structures were performed using the standard protocol without intravenous contrast. Multiplanar CT image reconstructions of the maxillofacial structures were also generated. COMPARISON:  12/04/2015 FINDINGS: CT HEAD FINDINGS Brain: Stable cerebral atrophy. Diffuse low density throughout the periventricular and subcortical cortical white matter. 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 These old ischemic changes are stable. No evidence for  acute hemorrhage, mass lesion, midline shift, hydrocephalus or large new infarct. Vascular: Again noted is ectasia of the right vertebral artery. Skull: No skull fracture. Other: None. CT MAXILLOFACIAL FINDINGS Osseous: No facial bone fracture. The mandibular condyles are located. Mild degenerative disease in the cervical spine at C4-C5. Findings are similar to the previous cervical spine CT. Patient is missing multiple teeth. Crista galli is midline and intact. Mandible is intact. There is an implant or prosthetic device along the maxilla. Appears to be a large dental caries involving the most posterior right upper tooth. Orbits: Both globes are intact. Sinuses: Mastoid air cells and paranasal sinuses are clear. Soft tissues: No significant soft tissue swelling in the upper neck. There is soft tissue swelling in the right periorbital region, particularly in the right forehead. IMPRESSION: No acute intracranial abnormality. Cerebral atrophy with old ischemic changes. Soft tissue swelling in the right forehead and right periorbital region. No facial bone fracture. Electronically Signed   By: Richarda OverlieAdam  Henn M.D.   On: 12/31/2015 12:02    Micro Results     No results found for this or any previous visit (from the past 240 hour(s)).     Today   Subjective:   Fae PippinRebecca Dasch today has no headache,no chest abdominal pain,no new weakness tingling or numbness, feels much better wants to go home today.   Objective:   Blood pressure (!) 171/87, pulse 72, temperature 98 F (36.7 C), temperature source Oral, resp. rate 18, height 5\' 4"  (1.626 m), weight 67.2 kg (148 lb 3.2 oz), SpO2 100 %.   Intake/Output Summary (Last 24 hours)  at 01/01/16 1409 Last data filed at 01/01/16 1045  Gross per 24 hour  Intake             1752 ml  Output              175 ml  Net             1577 ml    Exam Awake Alert, Oriented x 3, No new F.N deficits, Normal affect Comfort.AT,PERRAL Supple Neck,No JVD, No cervical  lymphadenopathy appriciated.  Symmetrical Chest wall movement, Good air movement bilaterally, CTAB RRR,No Gallops,Rubs or new Murmurs, No Parasternal Heave +ve B.Sounds, Abd Soft, Non tender, No organomegaly appriciated, No rebound -guarding or rigidity. No Cyanosis, Clubbing or edema, No new Rash or bruise  Data Review   CBC w Diff: Lab Results  Component Value Date   WBC 7.5 12/31/2015   HGB 13.5 12/31/2015   HCT 39.3 12/31/2015   PLT 214 12/31/2015   LYMPHOPCT 25 12/31/2015   MONOPCT 5 12/31/2015   EOSPCT 2 12/31/2015   BASOPCT 2 12/31/2015    CMP: Lab Results  Component Value Date   NA 141 01/01/2016   NA 134 (L) 12/30/2013   K 3.0 (L) 01/01/2016   K 4.1 12/30/2013   CL 109 01/01/2016   CL 104 12/30/2013   CO2 26 01/01/2016   CO2 20 (L) 12/30/2013   BUN 9 01/01/2016   BUN 4 (L) 12/30/2013   CREATININE 0.95 01/01/2016   CREATININE 0.67 12/30/2013   PROT 7.5 12/31/2015   PROT 7.2 12/30/2013   ALBUMIN 3.8 12/31/2015   ALBUMIN 3.3 (L) 12/30/2013   BILITOT 1.0 12/31/2015   BILITOT 0.4 12/30/2013   ALKPHOS 86 12/31/2015   ALKPHOS 85 12/30/2013   AST 15 12/31/2015   AST 31 12/30/2013   ALT 13 (L) 12/31/2015   ALT 34 12/30/2013  .   Total Time in preparing paper work, data evaluation and todays exam - 35 minutes  Lemar Bakos M.D on 01/01/2016 at 2:09 PM    Note: This dictation was prepared with Dragon dictation along with smaller phrase technology. Any transcriptional errors that result from this process are unintentional.

## 2016-01-02 DIAGNOSIS — M7918 Myalgia, other site: Secondary | ICD-10-CM

## 2016-01-02 DIAGNOSIS — F1310 Sedative, hypnotic or anxiolytic abuse, uncomplicated: Secondary | ICD-10-CM | POA: Insufficient documentation

## 2016-01-02 DIAGNOSIS — G8929 Other chronic pain: Secondary | ICD-10-CM | POA: Insufficient documentation

## 2016-01-02 DIAGNOSIS — Z8673 Personal history of transient ischemic attack (TIA), and cerebral infarction without residual deficits: Secondary | ICD-10-CM | POA: Insufficient documentation

## 2016-01-02 LAB — URINE CULTURE
Culture: >=100000 — AB
Special Requests: NORMAL

## 2016-01-08 ENCOUNTER — Ambulatory Visit: Payer: Self-pay | Admitting: Family Medicine

## 2016-02-11 DIAGNOSIS — M79641 Pain in right hand: Secondary | ICD-10-CM | POA: Diagnosis not present

## 2016-02-11 DIAGNOSIS — M79602 Pain in left arm: Secondary | ICD-10-CM | POA: Diagnosis not present

## 2016-02-11 DIAGNOSIS — M21371 Foot drop, right foot: Secondary | ICD-10-CM | POA: Diagnosis not present

## 2016-02-11 DIAGNOSIS — M79642 Pain in left hand: Secondary | ICD-10-CM | POA: Diagnosis not present

## 2016-02-11 DIAGNOSIS — G600 Hereditary motor and sensory neuropathy: Secondary | ICD-10-CM | POA: Diagnosis not present

## 2016-02-11 DIAGNOSIS — M7989 Other specified soft tissue disorders: Secondary | ICD-10-CM | POA: Diagnosis not present

## 2016-02-16 DIAGNOSIS — F410 Panic disorder [episodic paroxysmal anxiety] without agoraphobia: Secondary | ICD-10-CM | POA: Diagnosis not present

## 2016-02-16 DIAGNOSIS — F331 Major depressive disorder, recurrent, moderate: Secondary | ICD-10-CM | POA: Diagnosis not present

## 2016-02-16 DIAGNOSIS — F411 Generalized anxiety disorder: Secondary | ICD-10-CM | POA: Diagnosis not present

## 2016-02-26 DIAGNOSIS — M24541 Contracture, right hand: Secondary | ICD-10-CM | POA: Diagnosis not present

## 2016-02-26 DIAGNOSIS — E785 Hyperlipidemia, unspecified: Secondary | ICD-10-CM | POA: Diagnosis not present

## 2016-02-26 DIAGNOSIS — R32 Unspecified urinary incontinence: Secondary | ICD-10-CM | POA: Diagnosis not present

## 2016-02-26 DIAGNOSIS — M24542 Contracture, left hand: Secondary | ICD-10-CM | POA: Diagnosis not present

## 2016-02-26 DIAGNOSIS — I10 Essential (primary) hypertension: Secondary | ICD-10-CM | POA: Diagnosis not present

## 2016-02-26 DIAGNOSIS — Z9181 History of falling: Secondary | ICD-10-CM | POA: Diagnosis not present

## 2016-02-26 DIAGNOSIS — F17210 Nicotine dependence, cigarettes, uncomplicated: Secondary | ICD-10-CM | POA: Diagnosis not present

## 2016-02-26 DIAGNOSIS — F329 Major depressive disorder, single episode, unspecified: Secondary | ICD-10-CM | POA: Diagnosis not present

## 2016-02-26 DIAGNOSIS — G600 Hereditary motor and sensory neuropathy: Secondary | ICD-10-CM | POA: Diagnosis not present

## 2016-02-27 DIAGNOSIS — M24541 Contracture, right hand: Secondary | ICD-10-CM | POA: Diagnosis not present

## 2016-02-27 DIAGNOSIS — G600 Hereditary motor and sensory neuropathy: Secondary | ICD-10-CM | POA: Diagnosis not present

## 2016-02-27 DIAGNOSIS — E785 Hyperlipidemia, unspecified: Secondary | ICD-10-CM | POA: Diagnosis not present

## 2016-02-27 DIAGNOSIS — F329 Major depressive disorder, single episode, unspecified: Secondary | ICD-10-CM | POA: Diagnosis not present

## 2016-02-27 DIAGNOSIS — M24542 Contracture, left hand: Secondary | ICD-10-CM | POA: Diagnosis not present

## 2016-02-27 DIAGNOSIS — I10 Essential (primary) hypertension: Secondary | ICD-10-CM | POA: Diagnosis not present

## 2016-03-03 DIAGNOSIS — G600 Hereditary motor and sensory neuropathy: Secondary | ICD-10-CM | POA: Diagnosis not present

## 2016-03-03 DIAGNOSIS — E785 Hyperlipidemia, unspecified: Secondary | ICD-10-CM | POA: Diagnosis not present

## 2016-03-03 DIAGNOSIS — F329 Major depressive disorder, single episode, unspecified: Secondary | ICD-10-CM | POA: Diagnosis not present

## 2016-03-03 DIAGNOSIS — M24542 Contracture, left hand: Secondary | ICD-10-CM | POA: Diagnosis not present

## 2016-03-03 DIAGNOSIS — M24541 Contracture, right hand: Secondary | ICD-10-CM | POA: Diagnosis not present

## 2016-03-03 DIAGNOSIS — I10 Essential (primary) hypertension: Secondary | ICD-10-CM | POA: Diagnosis not present

## 2016-03-04 DIAGNOSIS — I10 Essential (primary) hypertension: Secondary | ICD-10-CM | POA: Diagnosis not present

## 2016-03-04 DIAGNOSIS — F329 Major depressive disorder, single episode, unspecified: Secondary | ICD-10-CM | POA: Diagnosis not present

## 2016-03-04 DIAGNOSIS — M24541 Contracture, right hand: Secondary | ICD-10-CM | POA: Diagnosis not present

## 2016-03-04 DIAGNOSIS — E785 Hyperlipidemia, unspecified: Secondary | ICD-10-CM | POA: Diagnosis not present

## 2016-03-04 DIAGNOSIS — G600 Hereditary motor and sensory neuropathy: Secondary | ICD-10-CM | POA: Diagnosis not present

## 2016-03-04 DIAGNOSIS — M24542 Contracture, left hand: Secondary | ICD-10-CM | POA: Diagnosis not present

## 2016-03-16 DIAGNOSIS — F329 Major depressive disorder, single episode, unspecified: Secondary | ICD-10-CM | POA: Diagnosis not present

## 2016-03-16 DIAGNOSIS — I10 Essential (primary) hypertension: Secondary | ICD-10-CM | POA: Diagnosis not present

## 2016-03-16 DIAGNOSIS — M24541 Contracture, right hand: Secondary | ICD-10-CM | POA: Diagnosis not present

## 2016-03-16 DIAGNOSIS — M24542 Contracture, left hand: Secondary | ICD-10-CM | POA: Diagnosis not present

## 2016-03-16 DIAGNOSIS — G600 Hereditary motor and sensory neuropathy: Secondary | ICD-10-CM | POA: Diagnosis not present

## 2016-03-16 DIAGNOSIS — E785 Hyperlipidemia, unspecified: Secondary | ICD-10-CM | POA: Diagnosis not present

## 2016-03-17 DIAGNOSIS — G600 Hereditary motor and sensory neuropathy: Secondary | ICD-10-CM | POA: Diagnosis not present

## 2016-03-17 DIAGNOSIS — E785 Hyperlipidemia, unspecified: Secondary | ICD-10-CM | POA: Diagnosis not present

## 2016-03-17 DIAGNOSIS — M24542 Contracture, left hand: Secondary | ICD-10-CM | POA: Diagnosis not present

## 2016-03-17 DIAGNOSIS — F329 Major depressive disorder, single episode, unspecified: Secondary | ICD-10-CM | POA: Diagnosis not present

## 2016-03-17 DIAGNOSIS — M24541 Contracture, right hand: Secondary | ICD-10-CM | POA: Diagnosis not present

## 2016-03-17 DIAGNOSIS — I10 Essential (primary) hypertension: Secondary | ICD-10-CM | POA: Diagnosis not present

## 2016-03-18 DIAGNOSIS — I10 Essential (primary) hypertension: Secondary | ICD-10-CM | POA: Diagnosis not present

## 2016-03-18 DIAGNOSIS — M24541 Contracture, right hand: Secondary | ICD-10-CM | POA: Diagnosis not present

## 2016-03-18 DIAGNOSIS — G600 Hereditary motor and sensory neuropathy: Secondary | ICD-10-CM | POA: Diagnosis not present

## 2016-03-18 DIAGNOSIS — F329 Major depressive disorder, single episode, unspecified: Secondary | ICD-10-CM | POA: Diagnosis not present

## 2016-03-18 DIAGNOSIS — E785 Hyperlipidemia, unspecified: Secondary | ICD-10-CM | POA: Diagnosis not present

## 2016-03-18 DIAGNOSIS — M24542 Contracture, left hand: Secondary | ICD-10-CM | POA: Diagnosis not present

## 2016-03-19 DIAGNOSIS — E785 Hyperlipidemia, unspecified: Secondary | ICD-10-CM | POA: Diagnosis not present

## 2016-03-19 DIAGNOSIS — M24542 Contracture, left hand: Secondary | ICD-10-CM | POA: Diagnosis not present

## 2016-03-19 DIAGNOSIS — G600 Hereditary motor and sensory neuropathy: Secondary | ICD-10-CM | POA: Diagnosis not present

## 2016-03-19 DIAGNOSIS — F329 Major depressive disorder, single episode, unspecified: Secondary | ICD-10-CM | POA: Diagnosis not present

## 2016-03-19 DIAGNOSIS — I10 Essential (primary) hypertension: Secondary | ICD-10-CM | POA: Diagnosis not present

## 2016-03-19 DIAGNOSIS — M24541 Contracture, right hand: Secondary | ICD-10-CM | POA: Diagnosis not present

## 2016-03-22 DIAGNOSIS — E785 Hyperlipidemia, unspecified: Secondary | ICD-10-CM | POA: Diagnosis not present

## 2016-03-22 DIAGNOSIS — F329 Major depressive disorder, single episode, unspecified: Secondary | ICD-10-CM | POA: Diagnosis not present

## 2016-03-22 DIAGNOSIS — G600 Hereditary motor and sensory neuropathy: Secondary | ICD-10-CM | POA: Diagnosis not present

## 2016-03-22 DIAGNOSIS — M24542 Contracture, left hand: Secondary | ICD-10-CM | POA: Diagnosis not present

## 2016-03-22 DIAGNOSIS — I10 Essential (primary) hypertension: Secondary | ICD-10-CM | POA: Diagnosis not present

## 2016-03-22 DIAGNOSIS — R531 Weakness: Secondary | ICD-10-CM | POA: Diagnosis not present

## 2016-03-22 DIAGNOSIS — M24541 Contracture, right hand: Secondary | ICD-10-CM | POA: Diagnosis not present

## 2016-03-22 DIAGNOSIS — M79642 Pain in left hand: Secondary | ICD-10-CM | POA: Diagnosis not present

## 2016-03-22 DIAGNOSIS — M79641 Pain in right hand: Secondary | ICD-10-CM | POA: Diagnosis not present

## 2016-03-24 DIAGNOSIS — M24541 Contracture, right hand: Secondary | ICD-10-CM | POA: Diagnosis not present

## 2016-03-24 DIAGNOSIS — F329 Major depressive disorder, single episode, unspecified: Secondary | ICD-10-CM | POA: Diagnosis not present

## 2016-03-24 DIAGNOSIS — G600 Hereditary motor and sensory neuropathy: Secondary | ICD-10-CM | POA: Diagnosis not present

## 2016-03-24 DIAGNOSIS — I10 Essential (primary) hypertension: Secondary | ICD-10-CM | POA: Diagnosis not present

## 2016-03-24 DIAGNOSIS — M24542 Contracture, left hand: Secondary | ICD-10-CM | POA: Diagnosis not present

## 2016-03-24 DIAGNOSIS — E785 Hyperlipidemia, unspecified: Secondary | ICD-10-CM | POA: Diagnosis not present

## 2016-03-26 DIAGNOSIS — E785 Hyperlipidemia, unspecified: Secondary | ICD-10-CM | POA: Diagnosis not present

## 2016-03-26 DIAGNOSIS — M24542 Contracture, left hand: Secondary | ICD-10-CM | POA: Diagnosis not present

## 2016-03-26 DIAGNOSIS — F329 Major depressive disorder, single episode, unspecified: Secondary | ICD-10-CM | POA: Diagnosis not present

## 2016-03-26 DIAGNOSIS — I10 Essential (primary) hypertension: Secondary | ICD-10-CM | POA: Diagnosis not present

## 2016-03-26 DIAGNOSIS — G600 Hereditary motor and sensory neuropathy: Secondary | ICD-10-CM | POA: Diagnosis not present

## 2016-03-26 DIAGNOSIS — M24541 Contracture, right hand: Secondary | ICD-10-CM | POA: Diagnosis not present

## 2016-03-29 DIAGNOSIS — F329 Major depressive disorder, single episode, unspecified: Secondary | ICD-10-CM | POA: Diagnosis not present

## 2016-03-29 DIAGNOSIS — E785 Hyperlipidemia, unspecified: Secondary | ICD-10-CM | POA: Diagnosis not present

## 2016-03-29 DIAGNOSIS — M24542 Contracture, left hand: Secondary | ICD-10-CM | POA: Diagnosis not present

## 2016-03-29 DIAGNOSIS — M24541 Contracture, right hand: Secondary | ICD-10-CM | POA: Diagnosis not present

## 2016-03-29 DIAGNOSIS — I10 Essential (primary) hypertension: Secondary | ICD-10-CM | POA: Diagnosis not present

## 2016-03-29 DIAGNOSIS — G600 Hereditary motor and sensory neuropathy: Secondary | ICD-10-CM | POA: Diagnosis not present

## 2016-04-05 DIAGNOSIS — M24542 Contracture, left hand: Secondary | ICD-10-CM | POA: Diagnosis not present

## 2016-04-05 DIAGNOSIS — E785 Hyperlipidemia, unspecified: Secondary | ICD-10-CM | POA: Diagnosis not present

## 2016-04-05 DIAGNOSIS — I10 Essential (primary) hypertension: Secondary | ICD-10-CM | POA: Diagnosis not present

## 2016-04-05 DIAGNOSIS — F329 Major depressive disorder, single episode, unspecified: Secondary | ICD-10-CM | POA: Diagnosis not present

## 2016-04-05 DIAGNOSIS — M24541 Contracture, right hand: Secondary | ICD-10-CM | POA: Diagnosis not present

## 2016-04-05 DIAGNOSIS — G600 Hereditary motor and sensory neuropathy: Secondary | ICD-10-CM | POA: Diagnosis not present

## 2016-04-07 DIAGNOSIS — I10 Essential (primary) hypertension: Secondary | ICD-10-CM | POA: Diagnosis not present

## 2016-04-07 DIAGNOSIS — M24541 Contracture, right hand: Secondary | ICD-10-CM | POA: Diagnosis not present

## 2016-04-07 DIAGNOSIS — F329 Major depressive disorder, single episode, unspecified: Secondary | ICD-10-CM | POA: Diagnosis not present

## 2016-04-07 DIAGNOSIS — M24542 Contracture, left hand: Secondary | ICD-10-CM | POA: Diagnosis not present

## 2016-04-07 DIAGNOSIS — G600 Hereditary motor and sensory neuropathy: Secondary | ICD-10-CM | POA: Diagnosis not present

## 2016-04-07 DIAGNOSIS — E785 Hyperlipidemia, unspecified: Secondary | ICD-10-CM | POA: Diagnosis not present

## 2016-04-12 DIAGNOSIS — G600 Hereditary motor and sensory neuropathy: Secondary | ICD-10-CM | POA: Diagnosis not present

## 2016-04-13 DIAGNOSIS — I10 Essential (primary) hypertension: Secondary | ICD-10-CM | POA: Diagnosis not present

## 2016-04-13 DIAGNOSIS — M24542 Contracture, left hand: Secondary | ICD-10-CM | POA: Diagnosis not present

## 2016-04-13 DIAGNOSIS — M24541 Contracture, right hand: Secondary | ICD-10-CM | POA: Diagnosis not present

## 2016-04-13 DIAGNOSIS — F329 Major depressive disorder, single episode, unspecified: Secondary | ICD-10-CM | POA: Diagnosis not present

## 2016-04-13 DIAGNOSIS — E785 Hyperlipidemia, unspecified: Secondary | ICD-10-CM | POA: Diagnosis not present

## 2016-04-13 DIAGNOSIS — G600 Hereditary motor and sensory neuropathy: Secondary | ICD-10-CM | POA: Diagnosis not present

## 2016-04-15 DIAGNOSIS — M24542 Contracture, left hand: Secondary | ICD-10-CM | POA: Diagnosis not present

## 2016-04-15 DIAGNOSIS — I10 Essential (primary) hypertension: Secondary | ICD-10-CM | POA: Diagnosis not present

## 2016-04-15 DIAGNOSIS — M24541 Contracture, right hand: Secondary | ICD-10-CM | POA: Diagnosis not present

## 2016-04-15 DIAGNOSIS — E785 Hyperlipidemia, unspecified: Secondary | ICD-10-CM | POA: Diagnosis not present

## 2016-04-15 DIAGNOSIS — G600 Hereditary motor and sensory neuropathy: Secondary | ICD-10-CM | POA: Diagnosis not present

## 2016-04-15 DIAGNOSIS — F329 Major depressive disorder, single episode, unspecified: Secondary | ICD-10-CM | POA: Diagnosis not present

## 2016-07-02 DIAGNOSIS — G4700 Insomnia, unspecified: Secondary | ICD-10-CM | POA: Diagnosis not present

## 2016-07-02 DIAGNOSIS — G894 Chronic pain syndrome: Secondary | ICD-10-CM | POA: Diagnosis not present

## 2016-07-02 DIAGNOSIS — I10 Essential (primary) hypertension: Secondary | ICD-10-CM | POA: Diagnosis not present

## 2016-07-02 DIAGNOSIS — F339 Major depressive disorder, recurrent, unspecified: Secondary | ICD-10-CM | POA: Diagnosis not present

## 2016-09-28 DIAGNOSIS — G600 Hereditary motor and sensory neuropathy: Secondary | ICD-10-CM | POA: Insufficient documentation

## 2016-09-28 DIAGNOSIS — Z8679 Personal history of other diseases of the circulatory system: Secondary | ICD-10-CM | POA: Diagnosis not present

## 2016-11-09 ENCOUNTER — Encounter: Payer: Self-pay | Admitting: Pain Medicine

## 2016-11-09 DIAGNOSIS — Z789 Other specified health status: Secondary | ICD-10-CM | POA: Insufficient documentation

## 2016-11-09 DIAGNOSIS — G950 Syringomyelia and syringobulbia: Secondary | ICD-10-CM | POA: Insufficient documentation

## 2016-11-09 DIAGNOSIS — M5416 Radiculopathy, lumbar region: Secondary | ICD-10-CM | POA: Insufficient documentation

## 2016-11-09 DIAGNOSIS — M899 Disorder of bone, unspecified: Secondary | ICD-10-CM | POA: Insufficient documentation

## 2016-11-09 DIAGNOSIS — M79605 Pain in left leg: Secondary | ICD-10-CM

## 2016-11-09 DIAGNOSIS — M79602 Pain in left arm: Secondary | ICD-10-CM

## 2016-11-09 DIAGNOSIS — M4310 Spondylolisthesis, site unspecified: Secondary | ICD-10-CM | POA: Insufficient documentation

## 2016-11-09 DIAGNOSIS — M25561 Pain in right knee: Secondary | ICD-10-CM

## 2016-11-09 DIAGNOSIS — M79604 Pain in right leg: Secondary | ICD-10-CM

## 2016-11-09 DIAGNOSIS — G894 Chronic pain syndrome: Secondary | ICD-10-CM | POA: Insufficient documentation

## 2016-11-09 DIAGNOSIS — M25562 Pain in left knee: Secondary | ICD-10-CM

## 2016-11-09 DIAGNOSIS — R94130 Abnormal response to nerve stimulation, unspecified: Secondary | ICD-10-CM | POA: Insufficient documentation

## 2016-11-09 DIAGNOSIS — G629 Polyneuropathy, unspecified: Secondary | ICD-10-CM | POA: Insufficient documentation

## 2016-11-09 DIAGNOSIS — M25511 Pain in right shoulder: Secondary | ICD-10-CM

## 2016-11-09 DIAGNOSIS — M5030 Other cervical disc degeneration, unspecified cervical region: Secondary | ICD-10-CM | POA: Insufficient documentation

## 2016-11-09 DIAGNOSIS — G8929 Other chronic pain: Secondary | ICD-10-CM | POA: Insufficient documentation

## 2016-11-09 DIAGNOSIS — Z79899 Other long term (current) drug therapy: Secondary | ICD-10-CM | POA: Insufficient documentation

## 2016-11-09 DIAGNOSIS — M542 Cervicalgia: Secondary | ICD-10-CM

## 2016-11-09 DIAGNOSIS — M4722 Other spondylosis with radiculopathy, cervical region: Secondary | ICD-10-CM | POA: Insufficient documentation

## 2016-11-09 NOTE — Progress Notes (Signed)
Patient's Name: Maria Wheeler  MRN: [id number]  Referring Provider: Anabel Bene, MD  DOB: [birth year]  PCP: Maria Jericho, NP  DOS: 11/10/2016  Note by: Maria Cola, MD  Service setting: Ambulatory outpatient  Specialty: Interventional Pain Management  Location: [facility] (AMB) Pain Management Facility  Visit type: Initial Patient Evaluation  Patient type: New Patient   Primary Reason(s) for Visit: Encounter for initial evaluation of one or more chronic problems (new to examiner) potentially causing chronic pain, and posing a threat to normal musculoskeletal function. (Level of risk: High) CC: Other (pain is "all over" due to charco marie tooth disease)  HPI  Maria Wheeler is a 61 y.o. year old, female patient, who comes today to see [place] for the first time for an initial evaluation of her chronic pain. She has HTN (hypertension), benign; Auditory hallucinations; History of drug dependence (Maria Wheeler); Benzodiazepine dependence ([place]); Chronic anxiety; Diastolic dysfunction; Multiple falls; UTI (urinary tract infection); Hypokalemia; Depression with anxiety; Benzodiazepine abuse ([place]); Bilateral hand pain; Chronic musculoskeletal pain; CMT (Charcot-Marie-Tooth disease); H/O: stroke; Right foot drop; SDH (subdural hematoma) ([place]); Swelling of left hand; Weakness; Disorder of skeletal system; Pharmacologic therapy; Problems influencing health status; Chronic neck pain (Posterior) (Bilateral) (L>R); DDD (degenerative disc disease), cervical; Cervical spondylosis; Chronic shoulder pain (Right); Chronic upper extremity pain (Left); Chronic pain syndrome; Chronic knee pain (Right); Chronic lumbar radiculopathy (L5) (Right); Chronic lower extremity pain (Primary Area of Pain) (Bilateral) (L>R); Grade 1 Retrolisthesis of C5 over C6; Abnormal nerve conduction studies; Chronic upper extremity polyneuropathy (severe) (CMT); 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 Today she comes in for evaluation of her Other (pain is "all over" due to charco marie tooth disease)  Pain Assessment: Location:   Other (Comment) Radiating: n/a Onset: More than a month ago Duration: Chronic pain Quality:  (needles) Severity: 10-Worst pain ever/10 (self-reported pain score)  Note: Reported level is inconsistent with clinical observations. Clinically the patient looks like a 4/10 Information on the proper use of the pain scale provided to the patient today. When using our objective Pain Scale, levels between 6 and 10/10 are said to belong in an emergency room, as it progressively worsens from a 6/10, described as severely limiting, requiring emergency care not usually available at an outpatient pain management facility. At a 6/10 level, communication becomes difficult and requires great effort. Assistance to reach the emergency department may be required. Facial flushing and profuse sweating along with potentially dangerous increases in heart rate and blood pressure will be evident. Timing: Constant Modifying factors: medications  Onset and Duration: Present longer than 3 months Cause of pain: genetic Severity: Getting worse, NAS-11 at its worse: 10/10, NAS-11 at its best: 10/10, NAS-11 now: 10/10 and NAS-11 on the average: 10/10 Timing: Not influenced by the time of the day Aggravating Factors: Motion Alleviating Factors: Resting Associated Problems: Depression, Fatigue, Personality changes and Pain that does not allow patient to sleep Quality of Pain: Aching, Agonizing, Annoying, Disabling, Distressing, Dreadful, Fearful, Getting longer, Nagging, Sickening and Uncomfortable Previous Examinations or Tests: MRI scan, Nerve conduction test and Neurological evaluation Previous Treatments: Narcotic medications and Physical Therapy  The  patient comes into the clinics today for the first time for a chronic pain management evaluation. According to the patient the primary cause of her pain is her CMT syndrome. Her primary area  of pain is her lower extremities with the pain is bilateral with the left being worst on the right. In the case of the left lower extremity the pain goes all the way down to the bottom of the foot in what seems to be an S1 dermatomal distribution. The pattern is identical in the case of the right lower extremity, just not as bad. She denies any prior surgeries, nerve blocks, joint injections, physical therapy, or any recent x-rays.  The next area of pain is described to be that of the upper extremities with both arms being affected and the left being worst on the right. She indicates being right-handed. In the case of the left upper extremity the pain goes down to the index finger and the middle finger. In the case of the right upper extremity he goes primarily to the index finger. She again denies any prior surgeries, nerve blocks, joint injections, but does admit to having had physical therapy 6-8 months ago, which consisted of 2 sessions per week 6 weeks. This physical therapy seems to have worked, but as soon as she stopped it due to pain return. He denies any recent x-rays.  The next area of pain is that of the neck with the pain being posterior and bilateral. She describes the left side to be worse than the right. She denies any prior surgery, nerve blocks, joint injections, physical therapy, x-rays, and she also denies any shoulder pain or headaches.  In addition to the physical pain, she describes being "ill" all of the time due to her inability to do activities and how debilitating this condition has been. She indicates that the CMT has been in her family and her mother had it and her son also has it. She indicates having insomnia secondary to pain and having decreased appetite where she has lost approximately 5  pounds. The patient had some upper extremity nerve conduction test indicating that she has polyneuropathy of the upper extremities compatible with CMT.  According to the patient she has been treating her pain with Percocet 5 mg by mouth twice a day which was prescribed by Dr. Melrose Nakayama. When she takes a Percocet pill she indicates feeling the onset of the medication within an hour and the peak effect after 1.5-2 hours. The patient also indicates of the medication is completely gone after 4 hours. She denies any side effects and specifically she denies any constipation, nausea, or vomiting. She also denies any itching.  The patient was asked if there was any medication that had worked well for her in the past and she indicated that she had a physician in [place] that was prescribing Ativan for her 0.5 mg by mouth 3 times a day and this seems to have worked for her depressed. She indicated that it helped with her balance. However, her medical record indicates benzodiazepine dependence and abuse. I have informed the patient that we do not prescribe any type of benzodiazepines and that if she feels she may need this medication, then she will need a consult with a psychiatrist so that they can look at whether or not she has the indications for that medicine.  Today I took the time to provide the patient with information regarding my pain practice. The patient was informed that my practice is divided into two sections: an interventional pain management section, as well as a completely separate and distinct medication management section. I explained that I have procedure days for my interventional therapies, and evaluation days for follow-ups  and medication management. Because of the amount of documentation required during both, they are kept separated. This means that there is the possibility that she may be scheduled for a procedure on one day, and medication management the next. I have also informed her that  because of staffing and facility limitations, I no longer take patients for medication management only. To illustrate the reasons for this, I gave the patient the example of surgeons, and how inappropriate it would be to refer a patient to his/her care, just to write for the post-surgical antibiotics on a surgery done by a different surgeon.   Because interventional pain management is my board-certified specialty, the patient was informed that joining my practice means that they are open to any and all interventional therapies. I made it clear that this does not mean that they will be forced to have any procedures done. What this means is that I believe interventional therapies to be essential part of the diagnosis and proper management of chronic pain conditions. Therefore, patients not interested in these interventional alternatives will be better served under the care of a different practitioner.  The patient was also made aware of my Comprehensive Pain Management Safety Guidelines where by joining my practice, they limit all of their nerve blocks and joint injections to those done by our practice, for as long as we are retained to manage their care.   Historic Controlled Substance Pharmacotherapy Review  PMP and historical list of controlled substances: Oxycodone/APAP 5/325; lorazepam 0.5-1.0 mg; Lyrica 50 mg; alprazolam 1 mg; Soma 350 mg; Highest opioid analgesic regimen found: Oxycodone/APAP 5/325 one tablet by mouth every 6 hours (20 mg/day of oxycodone) (30 MME/day)  Most recent opioid analgesic: (10/27/2016) oxycodone/APAP 5/325 one tablet by mouth twice a day (10 mg/day of oxycodone) (15 MME/day)  Current opioid analgesics: None  Highest recorded MME/day: 30 mg/day MME/day: 0 mg/day Medications: The patient did not bring the medication(s) to the appointment, as requested in our "New Patient Package" Pharmacodynamics: Desired effects: Analgesia: The patient reports >50% benefit. Reported  improvement in function: The patient reports medication allows her to accomplish basic ADLs. Clinically meaningful improvement in function (CMIF): Sustained CMIF goals met Perceived effectiveness: Described as relatively effective, allowing for increase in activities of daily living (ADL) Undesirable effects: Side-effects or Adverse reactions: None reported Historical Monitoring: The patient  reports that she does not use drugs. List of all UDS Test(s): Lab Results  Component Value Date   MDMA NONE DETECTED 07/16/2014   COCAINSCRNUR NONE DETECTED 07/16/2014   COCAINSCRNUR NONE DETECTED 12/11/2011   PCPSCRNUR NONE DETECTED 07/16/2014   THCU NONE DETECTED 07/16/2014   THCU NONE DETECTED 12/11/2011   ETH <5 07/16/2014   ETH <11 12/11/2011   List of all Serum Drug Screening Test(s):  No results found for: AMPHSCRSER, BARBSCRSER, BENZOSCRSER, COCAINSCRSER, PCPSCRSER, PCPQUANT, THCSCRSER, CANNABQUANT, OPIATESCRSER, OXYSCRSER, PROPOXSCRSER Historical Background Evaluation: [place] PMP: Six (6) year initial data search conducted.             PMP NARX Score Report:  Narcotic: 471 Sedative: 471 Stimulant: 000 [place] Department of public safety, offender search: Editor, commissioning Information) Non-contributory Risk Assessment Profile: Aberrant behavior: None observed or detected today Risk factors for fatal opioid overdose: None identified today PMP NARX Overdose Risk Score: 430 Fatal overdose hazard ratio (HR): Calculation deferred Non-fatal overdose hazard ratio (HR): Calculation deferred Risk of opioid abuse or dependence: 0.7-3.0% with doses ? 36 MME/day and 6.1-26% with doses ? 120 MME/day. Substance use disorder (SUD) risk  level: Pending results of Medical Psychology Evaluation for SUD Opioid risk tool (ORT) (Total Score): 1     Opioid Risk Tool - 11/10/16 1112      Family History of Substance Abuse   Alcohol Positive Female   Illegal Drugs Negative   Rx Drugs Negative     Personal History of  Substance Abuse   Alcohol Negative   Illegal Drugs Negative   Rx Drugs Negative     Age   Age between 21-45 years  No     History of Preadolescent Sexual Abuse   History of Preadolescent Sexual Abuse Negative or Female     Psychological Disease   Psychological Disease Negative   Depression Negative     Total Score   Opioid Risk Tool Scoring 1   Opioid Risk Interpretation Low Risk     ORT Scoring interpretation table:  Score <3 = Low Risk for SUD  Score between 4-7 = Moderate Risk for SUD  Score >8 = High Risk for Opioid Abuse   PHQ-2 Depression Scale:  Total score: 0  PHQ-2 Scoring interpretation table: (Score and probability of major depressive disorder)  Score 0 = No depression  Score 1 = 15.4% Probability  Score 2 = 21.1% Probability  Score 3 = 38.4% Probability  Score 4 = 45.5% Probability  Score 5 = 56.4% Probability  Score 6 = 78.6% Probability   PHQ-9 Depression Scale:  Total score: 0  PHQ-9 Scoring interpretation table:  Score 0-4 = No depression  Score 5-9 = Mild depression  Score 10-14 = Moderate depression  Score 15-19 = Moderately severe depression  Score 20-27 = Severe depression (2.4 times higher risk of SUD and 2.89 times higher risk of overuse)   Pharmacologic Plan: Pending ordered tests and/or consults            Initial impression: Pending review of available data and ordered tests.  Meds   Current Outpatient Prescriptions:  .  atenolol (TENORMIN) 50 MG tablet, Take 100 mg by mouth daily. , Disp: , Rfl:  .  oxyCODONE-acetaminophen (PERCOCET/ROXICET) 5-325 MG tablet, Take 1 tablet by mouth 2 (two) times daily., Disp: , Rfl:  .  lisinopril (PRINIVIL,ZESTRIL) 10 MG tablet, Take 10 tablets by mouth daily., Disp: , Rfl:   Imaging Review  Cervical Imaging: Cervical CT wo contrast:  Results for orders placed during the hospital encounter of 12/04/15  CT Cervical Spine Wo Contrast   Narrative CLINICAL DATA:  Left facial weakness and facial droop  with slurred speech. Multiple falls in the last 2 weeks.  EXAM: CT HEAD WITHOUT CONTRAST  CT CERVICAL SPINE WITHOUT CONTRAST  TECHNIQUE: Multidetector CT imaging of the head and cervical spine was performed following the standard protocol without intravenous contrast. Multiplanar CT image reconstructions of the cervical spine were also generated.  COMPARISON:  11/02/2015  FINDINGS: CT HEAD FINDINGS  Brain: Remote lacunar infarcts in the right pons, left lentiform nucleus and head of the left caudate nucleus, left anterior internal capsule, and right lentiform nucleus, not appreciably changed from prior.  Otherwise, The brainstem, cerebellum, cerebral peduncles, thalami, basal ganglia, basilar cisterns, and ventricular system appear within normal limits. Periventricular white matter and corona radiata hypodensities favor chronic ischemic microvascular white matter disease. No intracranial hemorrhage, mass lesion, or acute CVA.  Vascular: Mildly ectatic basilar artery. Minimal atherosclerotic calcification of the cavernous carotid arteries.  Skull: Unremarkable  Sinuses/Orbits: Unremarkable  Other: Incidental note is made of a tiny amount of gas in the superior  ophthalmic vein, probably benign/iatrogenic gas from IV placement  CT CERVICAL SPINE FINDINGS  Alignment: 2.5 mm degenerative anterolisthesis at C5-6, unchanged.  Skull base and vertebrae: Posterior spurring at C6-7, unchanged. Loss of disc height at C6-7. No fracture or acute subluxation identified.  Soft tissues and spinal canal: Unremarkable  Disc levels:  No impingement identified.  Upper chest: Unremarkable  Other: No supplemental non-categorized findings.  IMPRESSION: 1. No acute intracranial findings or acute cervical spine findings. 2. Remote lacunar infarcts involving the right pons, bilateral lentiform nuclei, and left caudate head. Not changed from prior. 3. Periventricular white matter  and corona radiata hypodensities favor chronic ischemic microvascular white matter disease. 4. Spondylosis and degenerative disc disease most notable at C6-7. 2.5 mm degenerative retrolisthesis at C5-6, unchanged.   Electronically Signed   By: Van Clines M.D.   On: 12/04/2015 12:19    Shoulder Imaging: Shoulder-R DG:  Results for orders placed in visit on 11/27/05  DG Shoulder Right   Narrative * PRIOR REPORT IMPORTED FROM AN EXTERNAL SYSTEM *   PRIOR REPORT IMPORTED FROM THE [facility] WORKFLOW SYSTEM   REASON FOR EXAM:    pain  COMMENTS:   PROCEDURE:     DXR - DXR SHOULDER RIGHT COMPLETE  - Nov 28 2005  8:55AM   RESULT:          Views of the RIGHT shoulder reveal the bones to be  adequately mineralized.  There is no evidence of fracture or dislocation.  The overlying soft tissues are normal in appearance.   IMPRESSION:          I see no acute bony abnormality of the RIGHT  shoulder.   Thank you for this opportunity to contribute to the care of your patient.       Lumbosacral Imaging: Lumbar DG 2-3 views:  Results for orders placed during the hospital encounter of 12/04/15  DG Lumbar Spine 2-3 Views   Narrative CLINICAL DATA:  Multiple falls over the last month. Diffuse low back pain.  EXAM: LUMBAR SPINE - 2-3 VIEW  COMPARISON:  12/25/2007  FINDINGS: No significant malalignment. No evidence of fracture. Mild disc space narrowing L5-S1. Mild lower lumbar facet osteoarthritis. Some aortic atherosclerotic calcification.  IMPRESSION: No acute or traumatic finding. Mild lower lumbar degenerative disc disease and degenerative facet disease.  Aortic atherosclerosis.   Electronically Signed   By: Nelson Chimes M.D.   On: 12/04/2015 12:02    Knee Imaging: Knee-R DG 4 views:  Results for orders placed during the hospital encounter of 12/04/15  DG Knee Complete 4 Views Right   Narrative CLINICAL DATA:  Multiple falls over the last month, most  recently yesterday. Anterior pain and swelling.  EXAM: RIGHT KNEE - COMPLETE 4+ VIEW  COMPARISON:  None.  FINDINGS: No fracture. Small joint effusion. There is degenerative chondrocalcinosis of the menisci. No joint space narrowing or osteophyte formation.  IMPRESSION: No acute traumatic bone finding. Small joint effusion. Degenerative chondrocalcinosis of the menisci.   Electronically Signed   By: Nelson Chimes M.D.   On: 12/04/2015 12:03    Complexity Note: Imaging results reviewed. Results shared with Ms. Buboltz, using Layman's terms.                         ROS  Cardiovascular History: High blood pressure Pulmonary or Respiratory History: Snoring  Neurological History: No reported neurological signs or symptoms such as seizures, abnormal skin sensations, urinary and/or fecal incontinence, being  born with an abnormal open spine and/or a tethered spinal cord Review of Past Neurological Studies:  Results for orders placed or performed during the hospital encounter of 12/31/15  CT Head Wo Contrast   Narrative   CLINICAL DATA:  61 year old with fall and head/face injury.  EXAM: CT HEAD WITHOUT CONTRAST  CT MAXILLOFACIAL WITHOUT CONTRAST  TECHNIQUE: Multidetector CT imaging of the head and maxillofacial structures were performed using the standard protocol without intravenous contrast. Multiplanar CT image reconstructions of the maxillofacial structures were also generated.  COMPARISON:  12/04/2015  FINDINGS: CT HEAD FINDINGS  Brain: Stable cerebral atrophy. Diffuse low density throughout the periventricular and subcortical cortical white matter. 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 These old ischemic changes are stable. No evidence for acute hemorrhage, mass lesion, midline shift, hydrocephalus or large new infarct.  Vascular: Again noted is ectasia of the right vertebral artery.  Skull: No skull fracture.  Other:  None.  CT MAXILLOFACIAL FINDINGS  Osseous: No facial bone fracture. The mandibular condyles are located. Mild degenerative disease in the cervical spine at C4-C5. Findings are similar to the previous cervical spine CT. Patient is missing multiple teeth. Crista galli is midline and intact. Mandible is intact. There is an implant or prosthetic device along the maxilla. Appears to be a large dental caries involving the most posterior right upper tooth.  Orbits: Both globes are intact.  Sinuses: Mastoid air cells and paranasal sinuses are clear.  Soft tissues: No significant soft tissue swelling in the upper neck. There is soft tissue swelling in the right periorbital region, particularly in the right forehead.  IMPRESSION: No acute intracranial abnormality.  Cerebral atrophy with old ischemic changes.  Soft tissue swelling in the right forehead and right periorbital region. No facial bone fracture.   Electronically Signed   By: Markus Daft M.D.   On: 12/31/2015 12:02   Results for orders placed or performed during the hospital encounter of 09/25/15  MR Brain Wo Contrast   Narrative   CLINICAL DATA:  Weakness on LEFT side of face.  Frequent falls.  EXAM: MRI HEAD WITHOUT CONTRAST  TECHNIQUE: Multiplanar, multiecho pulse sequences of the brain and surrounding structures were obtained without intravenous contrast.  COMPARISON:  CT 09/18/2015.  MR brain 04/11/2014.  FINDINGS: No evidence for acute infarction, hemorrhage, mass lesion, hydrocephalus, or extra-axial fluid. Premature for age cerebral and cerebellar atrophy. Advanced T2 and FLAIR hyperintensities throughout the white matter, representing chronic microvascular ischemic change. Widespread areas of lacunar infarction throughout the cerebral hemispheres, deep nuclei, and brainstem.  Flow voids are maintained throughout the carotid, basilar, and vertebral arteries. There are no areas of chronic hemorrhage;  RIGHT vertebral dominant.  Pituitary, pineal, and cerebellar tonsils unremarkable. No upper cervical lesions.  Visualized calvarium, skull base, and upper cervical osseous structures unremarkable. Scalp and extracranial soft tissues, orbits, sinuses, and mastoids show no acute process.  Compared with priors, the RIGHT lentiform nucleus infarct was acute.  IMPRESSION: Premature for age atrophy. Advanced cerebrovascular disease, with chronic microvascular ischemic change, as well as widespread areas of lacunar infarction.  No acute intracranial findings are evident to explain the reported symptoms.   Electronically Signed   By: Staci Righter M.D.   On: 09/25/2015 08:54   Results for orders placed or performed in visit on 11/27/05  MR Brain W Wo Contrast   Narrative   * PRIOR REPORT IMPORTED FROM AN EXTERNAL SYSTEM *   PRIOR REPORT IMPORTED FROM  THE [facility] WORKFLOW SYSTEM   REASON FOR EXAM:    CVA?  COMMENTS:   PROCEDURE:     MR  - MR BRAIN WO/W CONTRAST  - Nov 29 2005  3:35PM   RESULT:   HISTORY:  RIGHT sided weakness, questionable CVA, headache, slurred  speech.   PROCEDURE AND FINDINGS:    Multiplanar/multisequence imaging of the Brain  was obtained.  Diffusion-weighted images reveal no evidence of acute  ischemia.  No mass lesions are noted.  No enhancing lesions are noted.   Old  lacuna noted in the LEFT lenticular nucleus.  The posterior fossa is  unremarkable.  Inversion recovery images reveal subcortical and deep white  matter changes most consistent with chronic ischemia.  Other etiologies  would include vasculitis, migraines and primary white matter disease  including MS.   IMPRESSION:   Evidence suggesting chronic ischemia as described above. No evidence of  acute ischemia.   No focal abnormality otherwise noted.  Specifically, there is no evidence  of  mass lesions or enhancing lesion.  There is no hydrocephalus.   Thank you for the opportunity to  contribute to the care of your patient.       Psychological-Psychiatric History: Anxiousness, Depressed, Prone to panicking and Difficulty sleeping and or falling asleep Gastrointestinal History: No reported gastrointestinal signs or symptoms such as vomiting or evacuating blood, reflux, heartburn, alternating episodes of diarrhea and constipation, inflamed or scarred liver, or pancreas or irrregular and/or infrequent bowel movements Genitourinary History: No reported renal or genitourinary signs or symptoms such as difficulty voiding or producing urine, peeing blood, non-functioning kidney, kidney stones, difficulty emptying the bladder, difficulty controlling the flow of urine, or chronic kidney disease Hematological History: No reported hematological signs or symptoms such as prolonged bleeding, low or poor functioning platelets, bruising or bleeding easily, hereditary bleeding problems, low energy levels due to low hemoglobin or being anemic Endocrine History: No reported endocrine signs or symptoms such as high or low blood sugar, rapid heart rate due to high thyroid levels, obesity or weight gain due to slow thyroid or thyroid disease Rheumatologic History: No reported rheumatological signs and symptoms such as fatigue, joint pain, tenderness, swelling, redness, heat, stiffness, decreased range of motion, with or without associated rash Musculoskeletal History: Negative for myasthenia gravis, muscular dystrophy, multiple sclerosis or malignant hyperthermia Work History: Disabled  Allergies  Ms. Meding is allergic to penicillins.  Laboratory Chemistry  Inflammation Markers (CRP: Acute Phase) (ESR: Chronic Phase) No results found for: CRP, ESRSEDRATE               Renal Function Markers Lab Results  Component Value Date   BUN 9 01/01/2016   CREATININE 0.95 01/01/2016   GFRAA >60 01/01/2016   GFRNONAA >60 01/01/2016                 Hepatic Function Markers Lab Results  Component  Value Date   AST 15 12/31/2015   ALT 13 (L) 12/31/2015   ALBUMIN 3.8 12/31/2015   ALKPHOS 86 12/31/2015                 Electrolytes Lab Results  Component Value Date   NA 141 01/01/2016   K 3.0 (L) 01/01/2016   CL 109 01/01/2016   CALCIUM 8.4 (L) 01/01/2016   MG 1.9 12/31/2015                 Neuropathy Markers No results found for: [id number]  Bone Pathology Markers Lab Results  Component Value Date   ALKPHOS 86 12/31/2015   VD25OH 13.5 (L) 12/31/2015   CALCIUM 8.4 (L) 01/01/2016                 Coagulation Parameters Lab Results  Component Value Date   INR 1.0 12/30/2013   LABPROT 13.2 12/30/2013   APTT 28.9 12/30/2013   PLT 214 12/31/2015                 Cardiovascular Markers Lab Results  Component Value Date   HGB 13.5 12/31/2015   HCT 39.3 12/31/2015                 Note: Lab results reviewed.  [place]  Drug: Ms. Kassab  reports that she does not use drugs. Alcohol:  reports that she drinks about 0.6 oz of alcohol per week . Tobacco:  reports that she has never smoked. She has never used smokeless tobacco. Medical:  has a past medical history of Abdominal pain (12/22/2012); Abnormal EKG (12/11/2011); Altered mental status (12/11/2011); Anxiety; Benzodiazepine dependence ([place]) (12/14/2011); Charcot-Marie disease; Chronic anxiety (12/14/2011); Dehydration (12/11/2011); Diastolic dysfunction (56/38/7564); Drug withdrawal ([place]) (12/12/2011); Hypertension; Hypokalemia (12/11/2011); and Psychosis ([place]) (12/12/2011). Family: family history is not on file.  Past Surgical History:  Procedure Laterality Date  . ABDOMINAL HYSTERECTOMY     Active Ambulatory Problems    Diagnosis Date Noted  . HTN (hypertension), benign 12/11/2011  . Auditory hallucinations 12/11/2011  . History of drug dependence ([place]) 12/11/2011  . Benzodiazepine dependence ([place]) 12/14/2011  . Chronic anxiety 12/14/2011  . Diastolic dysfunction 33/29/5188  . Multiple falls 12/31/2015   . UTI (urinary tract infection) 12/31/2015  . Hypokalemia 12/31/2015  . Depression with anxiety 04/02/2013  . Benzodiazepine abuse ([place]) 01/02/2016  . Bilateral hand pain 02/13/2016  . Chronic musculoskeletal pain 01/02/2016  . 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 H/O: stroke 01/02/2016  . Right foot drop 02/13/2016  . SDH (subdural hematoma) ([place]) 12/31/2013  . Swelling of left hand 02/13/2016  . Weakness 07/17/2014  . Disorder of skeletal system 11/09/2016  . Pharmacologic therapy 11/09/2016  . Problems influencing health status 11/09/2016  . Chronic neck pain (Posterior) (Bilateral) (L>R) 11/09/2016  . DDD (degenerative disc disease), cervical 11/09/2016  . Cervical spondylosis 11/09/2016  . Chronic shoulder pain (Right) 11/09/2016  . Chronic upper extremity pain (Left) 11/09/2016  . Chronic pain syndrome 11/09/2016  . Chronic knee pain (Right) 11/09/2016  . Chronic lumbar radiculopathy (L5) (Right) 11/09/2016  . Chronic lower extremity pain (Primary Area of Pain) (Bilateral) (L>R) 11/09/2016  . Grade 1 Retrolisthesis of C5 over C6 11/09/2016  . Abnormal nerve conduction studies 11/09/2016  . 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[place]) 11/09/2016  . Adjustment disorder with mixed anxiety and depressed mood 11/10/2016  . Chronic upper extremity pain (Secondary Area of Pain) (Bilateral) (L>R) 11/10/2016  . Chronic hand pain (Bilateral) (L>R) 11/10/2016   Resolved Ambulatory Problems    Diagnosis Date Noted  . Altered mental status 12/11/2011  . Abnormal EKG 12/11/2011  . Syncope 12/11/2011  . Hypokalemia 12/11/2011  . Dehydration 12/11/2011  . Psychosis ([place]) 12/12/2011  . Drug withdrawal ([place]) 12/12/2011  . UTI (urinary tract infection) 12/13/2011  . Abdominal pain 12/22/2012   Past Medical History:  Diagnosis Date  . Abdominal pain 12/22/2012  . Abnormal EKG 12/11/2011  . Altered mental status 12/11/2011   . Anxiety   . Benzodiazepine dependence ([place])  12/14/2011  . Charcot-Marie disease   . Chronic anxiety 12/14/2011  . Dehydration 12/11/2011  . Diastolic dysfunction 78/29/5621  . Drug withdrawal ([place]) 12/12/2011  . Hypertension   . Hypokalemia 12/11/2011  . Psychosis ([place]) 12/12/2011   Constitutional Exam  General appearance: Well nourished, well developed, and well hydrated. In no apparent acute distress Vitals:   11/10/16 1101  BP: (!) 212/102  Pulse: (!) 55  Resp: 14  Temp: 98.3 F (36.8 C)  TempSrc: Oral  SpO2: 98%  Weight: 148 lb (67.1 kg)  Height: '5\' 4"'  (1.626 m)   BMI Assessment: Estimated body mass index is 25.4 kg/m as calculated from the following:   Height as of this encounter: '5\' 4"'  (1.626 m).   Weight as of this encounter: 148 lb (67.1 kg).  BMI interpretation table: BMI level Category Range association with higher incidence of chronic pain  <18 kg/m2 Underweight   18.5-24.9 kg/m2 Ideal body weight   25-29.9 kg/m2 Overweight Increased incidence by 20%  30-34.9 kg/m2 Obese (Class I) Increased incidence by 68%  35-39.9 kg/m2 Severe obesity (Class II) Increased incidence by 136%  >40 kg/m2 Extreme obesity (Class III) Increased incidence by 254%   BMI Readings from Last 4 Encounters:  11/10/16 25.40 kg/m  12/31/15 25.44 kg/m  12/04/15 23.30 kg/m  11/02/15 24.13 kg/m   Wt Readings from Last 4 Encounters:  11/10/16 148 lb (67.1 kg)  12/31/15 148 lb 3.2 oz (67.2 kg)  12/04/15 140 lb (63.5 kg)  11/02/15 145 lb (65.8 kg)  Psych/Mental status: Alert, oriented x 3 (person, place, & time)       Eyes: PERLA Respiratory: No evidence of acute respiratory distress  Cervical Spine Area Exam  Skin & Axial Inspection: No masses, redness, edema, swelling, or associated skin lesions Alignment: Symmetrical Functional ROM: Unrestricted ROM      Stability: No instability detected Muscle Tone/Strength: Functionally intact. No obvious neuro-muscular anomalies  detected. Sensory (Neurological): Unimpaired Palpation: No palpable anomalies              Upper Extremity (UE) Exam    Side: Right upper extremity  Side: Left upper extremity  Skin & Extremity Inspection: Skin color, temperature, and hair growth are WNL. No peripheral edema or cyanosis. No masses, redness, swelling, asymmetry, or associated skin lesions. No contractures.  Skin & Extremity Inspection: Skin color, temperature, and hair growth are WNL. No peripheral edema or cyanosis. No masses, redness, swelling, asymmetry, or associated skin lesions. No contractures.  Functional ROM: Unrestricted ROM          Functional ROM: Unrestricted ROM          Muscle Tone/Strength: Functionally intact. No obvious neuro-muscular anomalies detected.  Muscle Tone/Strength: Functionally intact. No obvious neuro-muscular anomalies detected.  Sensory (Neurological): Unimpaired          Sensory (Neurological): Unimpaired          Palpation: No palpable anomalies              Palpation: No palpable anomalies              Specialized Test(s): Deferred         Specialized Test(s): Deferred          Thoracic Spine Area Exam  Skin & Axial Inspection: No masses, redness, or swelling Alignment: Symmetrical Functional ROM: Unrestricted ROM Stability: No instability detected Muscle Tone/Strength: Functionally intact. No obvious neuro-muscular anomalies detected. Sensory (Neurological): Unimpaired Muscle strength & Tone: No palpable anomalies  Lumbar Spine Area  Exam  Skin & Axial Inspection: No masses, redness, or swelling Alignment: Symmetrical Functional ROM: Unrestricted ROM      Stability: No instability detected Muscle Tone/Strength: Functionally intact. No obvious neuro-muscular anomalies detected. Sensory (Neurological): Unimpaired Palpation: No palpable anomalies       Provocative Tests: Lumbar Hyperextension and rotation test: evaluation deferred today       Lumbar Lateral bending test: evaluation  deferred today       Patrick's Maneuver: evaluation deferred today                    Gait & Posture Assessment  Ambulation: Limited Gait: Awkward Posture: Antalgic   Lower Extremity Exam    Side: Right lower extremity  Side: Left lower extremity  Skin & Extremity Inspection: Skin color, temperature, and hair growth are WNL. No peripheral edema or cyanosis. No masses, redness, swelling, asymmetry, or associated skin lesions. No contractures.  Skin & Extremity Inspection: Skin color, temperature, and hair growth are WNL. No peripheral edema or cyanosis. No masses, redness, swelling, asymmetry, or associated skin lesions. No contractures.  Functional ROM: Unrestricted ROM          Functional ROM: Unrestricted ROM          Muscle Tone/Strength: Functionally intact. No obvious neuro-muscular anomalies detected.  Muscle Tone/Strength: Functionally intact. No obvious neuro-muscular anomalies detected.  Sensory (Neurological): Unimpaired  Sensory (Neurological): Unimpaired  Palpation: No palpable anomalies  Palpation: No palpable anomalies   Assessment  Primary Diagnosis & Pertinent Problem List: The primary encounter diagnosis was Chronic pain syndrome. Diagnoses of CMT (Charcot-Marie-Tooth disease), Chronic upper extremity polyneuropathy (severe) (CMT), Chronic upper extremity pain (Secondary Area of Pain) (Bilateral) (L>R), Chronic neck pain, DDD (degenerative disc disease), cervical, Grade 1 Retrolisthesis of C5 over C6, Cervical spondylosis, Chronic shoulder pain (Right), Chronic upper extremity pain (Left), 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HCC), Chronic knee pain (Right), Chronic lumbar radiculopathy (Right), Chronic lower extremity pain (Right), Abnormal nerve conduction studies, Chronic musculoskeletal pain, Disorder of skeletal system, Pharmacologic therapy, Problems influencing health status, Adjustment disorder with mixed anxiety and depressed mood, Benzodiazepine abuse ([place]), Benzodiazepine dependence  ([place]), and Chronic hand pain, unspecified laterality were also pertinent to this visit.  Visit Diagnosis (New problems to examiner): 1. Chronic pain syndrome   2. CMT (Charcot-Marie-Tooth disease)   3. Chronic upper extremity polyneuropathy (severe) (CMT)   4. Chronic upper extremity pain (Secondary Area of Pain) (Bilateral) (L>R)   5. Chronic neck pain   6. DDD (degenerative disc disease), cervical   7. Grade 1 Retrolisthesis of C5 over C6   8. Cervical spondylosis   9. Chronic shoulder pain (Right)   10. Chronic upper extremity pain (Left)   11. 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[place])   12. Chronic knee pain (Right)   13. Chronic lumbar radiculopathy (Right)   14. Chronic lower extremity pain (Right)   15. Abnormal nerve conduction studies   16. Chronic musculoskeletal pain   17. Disorder of skeletal system   18. Pharmacologic therapy   19. Problems influencing health status   20. Adjustment disorder with mixed anxiety and depressed mood   21. Benzodiazepine abuse ([place])   22. Benzodiazepine dependence ([place])   23. Chronic hand pain, unspecified laterality    Plan of Care (Initial workup plan)  Note: Ms. Gassmann was reminded that as per protocol, today's visit has been an evaluation only. We have not taken over the patient's controlled substance management.  Problem-specific plan: No problem-specific Assessment & Plan notes  found for this encounter.   Lab Orders     Compliance Drug Analysis, Ur     Comp. Metabolic Panel (12)     C-reactive protein     Sedimentation rate     Magnesium     25-Hydroxyvitamin D Lcms D2+D3     Vitamin B12  Imaging Orders     DG Cervical Spine With Flex & Extend  Referral Orders     Ambulatory referral to Psychology     Ambulatory referral to Psychiatry Procedure Orders    No procedure(s) ordered today   Pharmacotherapy (current): Medications ordered:  No orders of the defined types were placed in this encounter.  Medications administered during  this visit: Ms. Grudzien had no medications administered during this visit.   Pharmacological management options:  Opioid Analgesics: The patient was informed that there is no guarantee that she would be a candidate for opioid analgesics. The decision will be made following CDC guidelines. This decision will be based on the results of diagnostic studies, as well as Ms. Judon's risk profile.   Membrane stabilizer: To be determined at a later time  Muscle relaxant: To be determined at a later time  NSAID: To be determined at a later time  Other analgesic(s): To be determined at a later time   Interventional management options: Ms. Patchell was informed that there is no guarantee that she would be a candidate for interventional therapies. The decision will be based on the results of diagnostic studies, as well as Ms. Ripoll's risk profile.  Procedure(s) under consideration:  None at this time.    Provider-requested follow-up: Return for 2nd Visit w/ Dr. Dossie Arbour after [facility] eval.  No future appointments.  Primary Care Physician: Maria Jericho, NP Location: [facility] Note by: Maria Cola, MD Date: 11/10/2016; Time: 2:08 PM

## 2016-11-10 ENCOUNTER — Encounter: Payer: Self-pay | Admitting: Pain Medicine

## 2016-11-10 ENCOUNTER — Ambulatory Visit
Admission: RE | Admit: 2016-11-10 | Discharge: 2016-11-10 | Disposition: A | Discharge status: Home / Self Care | Payer: Medicare Other | Source: Ambulatory Visit | Attending: Pain Medicine | Admitting: Pain Medicine

## 2016-11-10 ENCOUNTER — Ambulatory Visit: Payer: Medicare Other | Attending: Pain Medicine | Admitting: Pain Medicine

## 2016-11-10 VITALS — BP 212/102 | HR 55 | Temp 98.3°F | Resp 14 | Ht 64.0 in | Wt 148.0 lb

## 2016-11-10 DIAGNOSIS — M47812 Spondylosis without myelopathy or radiculopathy, cervical region: Secondary | ICD-10-CM | POA: Diagnosis not present

## 2016-11-10 DIAGNOSIS — M79602 Pain in left arm: Secondary | ICD-10-CM | POA: Diagnosis not present

## 2016-11-10 DIAGNOSIS — G600 Hereditary motor and sensory neuropathy: Secondary | ICD-10-CM | POA: Insufficient documentation

## 2016-11-10 DIAGNOSIS — Z79899 Other long term (current) drug therapy: Secondary | ICD-10-CM

## 2016-11-10 DIAGNOSIS — M79643 Pain in unspecified hand: Secondary | ICD-10-CM

## 2016-11-10 DIAGNOSIS — G894 Chronic pain syndrome: Secondary | ICD-10-CM | POA: Diagnosis not present

## 2016-11-10 DIAGNOSIS — G8929 Other chronic pain: Secondary | ICD-10-CM

## 2016-11-10 DIAGNOSIS — G950 Syringomyelia and syringobulbia: Secondary | ICD-10-CM

## 2016-11-10 DIAGNOSIS — G629 Polyneuropathy, unspecified: Secondary | ICD-10-CM | POA: Diagnosis not present

## 2016-11-10 DIAGNOSIS — M5416 Radiculopathy, lumbar region: Secondary | ICD-10-CM

## 2016-11-10 DIAGNOSIS — M25511 Pain in right shoulder: Secondary | ICD-10-CM | POA: Diagnosis not present

## 2016-11-10 DIAGNOSIS — M5136 Other intervertebral disc degeneration, lumbar region: Secondary | ICD-10-CM | POA: Insufficient documentation

## 2016-11-10 DIAGNOSIS — Z8673 Personal history of transient ischemic attack (TIA), and cerebral infarction without residual deficits: Secondary | ICD-10-CM | POA: Insufficient documentation

## 2016-11-10 DIAGNOSIS — M5032 Other cervical disc degeneration, mid-cervical region, unspecified level: Secondary | ICD-10-CM | POA: Diagnosis not present

## 2016-11-10 DIAGNOSIS — M79601 Pain in right arm: Secondary | ICD-10-CM

## 2016-11-10 DIAGNOSIS — M542 Cervicalgia: Secondary | ICD-10-CM | POA: Diagnosis not present

## 2016-11-10 DIAGNOSIS — I129 Hypertensive chronic kidney disease with stage 1 through stage 4 chronic kidney disease, or unspecified chronic kidney disease: Secondary | ICD-10-CM | POA: Diagnosis not present

## 2016-11-10 DIAGNOSIS — M899 Disorder of bone, unspecified: Secondary | ICD-10-CM | POA: Diagnosis not present

## 2016-11-10 DIAGNOSIS — M5030 Other cervical disc degeneration, unspecified cervical region: Secondary | ICD-10-CM

## 2016-11-10 DIAGNOSIS — M4310 Spondylolisthesis, site unspecified: Secondary | ICD-10-CM

## 2016-11-10 DIAGNOSIS — F4323 Adjustment disorder with mixed anxiety and depressed mood: Secondary | ICD-10-CM | POA: Diagnosis not present

## 2016-11-10 DIAGNOSIS — F1310 Sedative, hypnotic or anxiolytic abuse, uncomplicated: Secondary | ICD-10-CM

## 2016-11-10 DIAGNOSIS — M5010 Cervical disc disorder with radiculopathy, unspecified cervical region: Secondary | ICD-10-CM | POA: Diagnosis not present

## 2016-11-10 DIAGNOSIS — R94130 Abnormal response to nerve stimulation, unspecified: Secondary | ICD-10-CM

## 2016-11-10 DIAGNOSIS — M25561 Pain in right knee: Secondary | ICD-10-CM

## 2016-11-10 DIAGNOSIS — F1320 Sedative, hypnotic or anxiolytic dependence, uncomplicated: Secondary | ICD-10-CM

## 2016-11-10 DIAGNOSIS — Z789 Other specified health status: Secondary | ICD-10-CM

## 2016-11-10 DIAGNOSIS — M4722 Other spondylosis with radiculopathy, cervical region: Secondary | ICD-10-CM

## 2016-11-10 DIAGNOSIS — M7918 Myalgia, other site: Secondary | ICD-10-CM

## 2016-11-10 DIAGNOSIS — M79604 Pain in right leg: Secondary | ICD-10-CM

## 2016-11-10 NOTE — Patient Instructions (Addendum)
____________________________________________________________________________________________  Pain Scale  Introduction: The pain score used by this practice is the Verbal Numerical Rating Scale (VNRS-11). This is an 11-point scale. It is for adults and children 10 years or older. There are significant differences in how the pain score is reported, used, and applied. Forget everything you learned in the past and learn this scoring system.  General Information: The scale should reflect your current level of pain. Unless you are specifically asked for the level of your worst pain, or your average pain. If you are asked for one of these two, then it should be understood that it is over the past 24 hours.  Basic Activities of Daily Living (ADL): Personal hygiene, dressing, eating, transferring, and using restroom.  Instructions: Most patients tend to report their level of pain as a combination of two factors, their physical pain and their psychosocial pain. This last one is also known as "suffering" and it is reflection of how physical pain affects you socially and psychologically. From now on, report them separately. From this point on, when asked to report your pain level, report only your physical pain. Use the following table for reference.  Pain Clinic Pain Levels (0-5/10)  Pain Level Score  Description  No Pain 0   Mild pain 1 Nagging, annoying, but does not interfere with basic activities of daily living (ADL). Patients are able to eat, bathe, get dressed, toileting (being able to get on and off the toilet and perform personal hygiene functions), transfer (move in and out of bed or a chair without assistance), and maintain continence (able to control bladder and bowel functions). Blood pressure and heart rate are unaffected. A normal heart rate for a healthy adult ranges from 60 to 100 bpm (beats per minute).   Mild to moderate pain 2 Noticeable and distracting. Impossible to hide from other  people. More frequent flare-ups. Still possible to adapt and function close to normal. It can be very annoying and may have occasional stronger flare-ups. With discipline, patients may get used to it and adapt.   Moderate pain 3 Interferes significantly with activities of daily living (ADL). It becomes difficult to feed, bathe, get dressed, get on and off the toilet or to perform personal hygiene functions. Difficult to get in and out of bed or a chair without assistance. Very distracting. With effort, it can be ignored when deeply involved in activities.   Moderately severe pain 4 Impossible to ignore for more than a few minutes. With effort, patients may still be able to manage work or participate in some social activities. Very difficult to concentrate. Signs of autonomic nervous system discharge are evident: dilated pupils (mydriasis); mild sweating (diaphoresis); sleep interference. Heart rate becomes elevated (>115 bpm). Diastolic blood pressure (lower number) rises above 100 mmHg. Patients find relief in laying down and not moving.   Severe pain 5 Intense and extremely unpleasant. Associated with frowning face and frequent crying. Pain overwhelms the senses.  Ability to do any activity or maintain social relationships becomes significantly limited. Conversation becomes difficult. Pacing back and forth is common, as getting into a comfortable position is nearly impossible. Pain wakes you up from deep sleep. Physical signs will be obvious: pupillary dilation; increased sweating; goosebumps; brisk reflexes; cold, clammy hands and feet; nausea, vomiting or dry heaves; loss of appetite; significant sleep disturbance with inability to fall asleep or to remain asleep. When persistent, significant weight loss is observed due to the complete loss of appetite and sleep deprivation.  Blood   pressure and heart rate becomes significantly elevated. Caution: If elevated blood pressure triggers a pounding headache  associated with blurred vision, then the patient should immediately seek attention at an urgent or emergency care unit, as these may be signs of an impending stroke.    Emergency Department Pain Levels (6-10/10)  Emergency Room Pain 6 Severely limiting. Requires emergency care and should not be seen or managed at an outpatient pain management facility. Communication becomes difficult and requires great effort. Assistance to reach the emergency department may be required. Facial flushing and profuse sweating along with potentially dangerous increases in heart rate and blood pressure will be evident.   Distressing pain 7 Self-care is very difficult. Assistance is required to transport, or use restroom. Assistance to reach the emergency department will be required. Tasks requiring coordination, such as bathing and getting dressed become very difficult.   Disabling pain 8 Self-care is no longer possible. At this level, pain is disabling. The individual is unable to do even the most "basic" activities such as walking, eating, bathing, dressing, transferring to a bed, or toileting. Fine motor skills are lost. It is difficult to think clearly.   Incapacitating pain 9 Pain becomes incapacitating. Thought processing is no longer possible. Difficult to remember your own name. Control of movement and coordination are lost.   The worst pain imaginable 10 At this level, most patients pass out from pain. When this level is reached, collapse of the autonomic nervous system occurs, leading to a sudden drop in blood pressure and heart rate. This in turn results in a temporary and dramatic drop in blood flow to the brain, leading to a loss of consciousness. Fainting is one of the body's self defense mechanisms. Passing out puts the brain in a calmed state and causes it to shut down for a while, in order to begin the healing process.    Summary: 1. Refer to this scale when providing us with your pain level. 2. Be  accurate and careful when reporting your pain level. This will help with your care. 3. Over-reporting your pain level will lead to loss of credibility. 4. Even a level of 1/10 means that there is pain and will be treated at our facility. 5. High, inaccurate reporting will be documented as "Symptom Exaggeration", leading to loss of credibility and suspicions of possible secondary gains such as obtaining more narcotics, or wanting to appear disabled, for fraudulent reasons. 6. Only pain levels of 5 or below will be seen at our facility. 7. Pain levels of 6 and above will be sent to the Emergency Department and the appointment cancelled. ____________________________________________________________________________________________   ____________________________________________________________________________________________  Appointment Policy Summary  It is our goal and responsibility to provide the medical community with assistance in the evaluation and management of patients with chronic pain. Unfortunately our resources are limited. Because we do not have an unlimited amount of time, or available appointments, we are required to closely monitor and manage their use. The following rules exist to maximize their use:  Patient's responsibilities: 1. Punctuality:  At what time should I arrive? You should be physically present in our office 30 minutes before your scheduled appointment. Your scheduled appointment is with your assigned healthcare provider. However, it takes 5-10 minutes to be "checked-in", and another 15 minutes for the nurses to do the admission. If you arrive to our office at the time you were given for your appointment, you will end up being at least 20-25 minutes late to your appointment with the   provider. 2. Tardiness:  What happens if I arrive only a few minutes after my scheduled appointment time? You will need to reschedule your appointment. The cutoff is your appointment time.  This is why it is so important that you arrive at least 30 minutes before that appointment. If you have an appointment scheduled for 10:00 AM and you arrive at 10:01, you will be required to reschedule your appointment.  3. Plan ahead:  Always assume that you will encounter traffic on your way in. Plan for it. If you are dependent on a driver, make sure they understand these rules and the need to arrive early. 4. Other appointments and responsibilities:  Avoid scheduling any other appointments before or after your pain clinic appointments.  5. Be prepared:  Write down everything that you need to discuss with your healthcare provider and give this information to the admitting nurse. Write down the medications that you will need refilled. Bring your pills and bottles (even the empty ones), to all of your appointments, except for those where a procedure is scheduled. 6. No children or pets:  Find someone to take care of them. It is not appropriate to bring them in. 7. Scheduling changes:  We request "advanced notification" of any changes or cancellations. 8. Advanced notification:  Defined as a time period of more than 24 hours prior to the originally scheduled appointment. This allows for the appointment to be offered to other patients. 9. Rescheduling:  When a visit is rescheduled, it will require the cancellation of the original appointment. For this reason they both fall within the category of "Cancellations".  10. Cancellations:  They require advanced notification. Any cancellation less than 24 hours before the  appointment will be recorded as a "No Show". 11. No Show:  Defined as an unkept appointment where the patient failed to notify or declare to the practice their intention or inability to keep the appointment.  Corrective process for repeat offenders:  1. Tardiness: Three (3) episodes of rescheduling due to late arrivals will be recorded as one (1) "No Show". 2. Cancellation or  reschedule: Three (3) cancellations or rescheduling will be recorded as one (1) "No Show". 3. "No Shows": Three (3) "No Shows" within a 12 month period will result in discharge from the practice.  ____________________________________________________________________________________________  Pain Management Discharge Instructions  General Discharge Instructions :  If you need to reach your doctor call: Monday-Friday 8:00 am - 4:00 pm at [phone] or toll free [phone].  After clinic hours [phone] to have operator reach doctor.  Bring all of your medication bottles to all your appointments in the pain clinic.  To cancel or reschedule your appointment with Pain Management please remember to call 24 hours in advance to avoid a fee.  Refer to the educational materials which you have been given on: General Risks, I had my Procedure. Discharge Instructions, Post Sedation.  Post Procedure Instructions:  The drugs you were given will stay in your system until tomorrow, so for the next 24 hours you should not drive, make any legal decisions or drink any alcoholic beverages.  You may eat anything you prefer, but it is better to start with liquids then soups and crackers, and gradually work up to solid foods.  Please notify your doctor immediately if you have any unusual bleeding, trouble breathing or pain that is not related to your normal pain.  Depending on the type of procedure that was done, some parts of your body may feel week and/or numb.    This usually clears up by tonight or the next day.  Walk with the use of an assistive device or accompanied by an adult for the 24 hours.  You may use ice on the affected area for the first 24 hours.  Put ice in a Ziploc bag and cover with a towel and place against area 15 minutes on 15 minutes off.  You may switch to heat after 24 hours. 

## 2016-11-10 NOTE — Progress Notes (Signed)
Safety precautions to be maintained throughout the outpatient stay will include: orient to surroundings, keep bed in low position, maintain call bell within reach at all times, provide assistance with transfer out of bed and ambulation.  

## 2016-11-14 LAB — COMP. METABOLIC PANEL (12)
ALBUMIN: 4.2 g/dL (ref 3.6–4.8)
AST: 11 IU/L (ref 0–40)
Albumin/Globulin Ratio: 1.4 (ref 1.2–2.2)
Alkaline Phosphatase: 81 IU/L (ref 39–117)
BUN/Creatinine Ratio: 21 (ref 12–28)
BUN: 18 mg/dL (ref 8–27)
Bilirubin Total: 0.3 mg/dL (ref 0.0–1.2)
CALCIUM: 9.3 mg/dL (ref 8.7–10.3)
CHLORIDE: 100 mmol/L (ref 96–106)
Creatinine, Ser: 0.87 mg/dL (ref 0.57–1.00)
GFR, EST AFRICAN AMERICAN: 83 mL/min/{1.73_m2} (ref >59)
GFR, EST NON AFRICAN AMERICAN: 72 mL/min/{1.73_m2} (ref >59)
GLOBULIN, TOTAL: 2.9 g/dL (ref 1.5–4.5)
GLUCOSE: 98 mg/dL (ref 65–99)
POTASSIUM: 3.8 mmol/L (ref 3.5–5.2)
SODIUM: 141 mmol/L (ref 134–144)
Total Protein: 7.1 g/dL (ref 6.0–8.5)

## 2016-11-14 LAB — VITAMIN B12: VITAMIN B 12: 615 pg/mL (ref 232–1245)

## 2016-11-14 LAB — 25-HYDROXY VITAMIN D LCMS D2+D3
25-Hydroxy, Vitamin D-2: <1 ng/mL
25-Hydroxy, Vitamin D: 13 ng/mL — ABNORMAL LOW

## 2016-11-14 LAB — MAGNESIUM: Magnesium: 2 mg/dL (ref 1.6–2.3)

## 2016-11-14 LAB — SEDIMENTATION RATE: Sed Rate: 5 mm/hr (ref 0–40)

## 2016-11-14 LAB — C-REACTIVE PROTEIN: CRP: 1.2 mg/L (ref 0.0–4.9)

## 2016-11-14 LAB — 25-HYDROXYVITAMIN D LCMS D2+D3: 25-HYDROXY, VITAMIN D-3: 13 ng/mL

## 2016-11-16 LAB — COMPLIANCE DRUG ANALYSIS, UR

## 2016-11-22 DIAGNOSIS — F060 Psychotic disorder with hallucinations due to known physiological condition: Secondary | ICD-10-CM | POA: Diagnosis not present

## 2016-11-22 DIAGNOSIS — F0630 Mood disorder due to known physiological condition, unspecified: Secondary | ICD-10-CM | POA: Diagnosis not present

## 2016-11-30 DIAGNOSIS — M5136 Other intervertebral disc degeneration, lumbar region: Secondary | ICD-10-CM | POA: Insufficient documentation

## 2016-11-30 DIAGNOSIS — M47816 Spondylosis without myelopathy or radiculopathy, lumbar region: Secondary | ICD-10-CM | POA: Insufficient documentation

## 2016-11-30 NOTE — Progress Notes (Signed)
Patient's Name: Maria Wheeler  MRN: [id number]  Referring Provider: Ricardo Jericho*  DOB: [birth year]  PCP: Ricardo Jericho, NP  DOS: 12/01/2016  Note by: Gaspar Cola, MD  Service setting: Ambulatory outpatient  Specialty: Interventional Pain Management  Location: [facility] (AMB) Pain Management Facility    Patient type: Established   Primary Reason(s) for Visit: Encounter for evaluation before starting new chronic pain management plan of care (Level of risk: moderate) CC: Foot Pain (bilaterally)  HPI  Maria Wheeler is a 61 y.o. year old, female patient, who comes today for a follow-up evaluation to review the test results and decide on a treatment plan. She has HTN (hypertension), benign; Auditory hallucinations; History of drug dependence ([place]); Benzodiazepine dependence ([place]); Chronic anxiety; Diastolic dysfunction; Multiple falls; UTI (urinary tract infection); Hypokalemia; Depression with anxiety; Benzodiazepine abuse ([place]); Chronic musculoskeletal pain; CMT (Charcot-Marie-Tooth disease); H/O: stroke; SDH (subdural hematoma) ([place]); Disorder of skeletal system; Pharmacologic therapy; Problems influencing health status; Chronic neck pain (Tertiary Area of Pain) (Posterior) (Bilateral) (L>R); DDD (degenerative disc disease), cervical; Cervical spondylosis; Chronic shoulder pain (Right); Chronic upper extremity pain (Left); Chronic pain syndrome; Chronic knee pain (B) (L>R); Chronic lumbar radiculopathy (L5/S1) (Left); Chronic lower extremity pain (Primary Area of Pain) (Bilateral) (L>R); Cervical Grade 1 Retrolisthesis of C5 over C6; Abnormal nerve conduction studies; Chronic upper extremity polyneuropathy (severe) (CMT); 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[place]); Adjustment disorder with mixed anxiety and depressed mood; Chronic upper extremity pain (Secondary Area of Pain) (Bilateral) (L>R); DDD (degenerative disc disease), lumbar; Lumbar facet arthropathy (Bilateral); 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 Her primarily concern today is the Foot Pain (bilaterally)  Pain Assessment: Location: Right, Left, Anterior Foot Radiating: radiates up entire body Onset: More than a month ago Duration: Chronic pain Quality: Aching, Constant, Throbbing Severity: 4 /10 (self-reported pain score)  Note: Reported level is compatible with observation.                         When using our objective Pain Scale, levels between 6 and 10/10 are said to belong in an emergency room, as it progressively worsens from a 6/10, described as severely limiting, requiring emergency care not usually available at an outpatient pain management facility. At a 6/10 level, communication becomes difficult and requires great effort. Assistance to reach the emergency department may be required. Facial flushing and profuse sweating along with potentially dangerous increases in heart rate and blood pressure will be evident. Effect on ADL: "I cant do anything I want too like I use too" Timing: Constant Modifying factors: medications, wrap feet and legs up,warm   Maria Wheeler comes in today for a 61 follow-up visit after her initial evaluation on 11/10/2016. Today we went over the results of her tests. These were explained in "Layman's terms". During today's appointment we went over my diagnostic impression, as well as the proposed treatment plan.  According to the patient the primary cause of her pain is her CMT syndrome. Her primary area of pain is her lower extremities with the pain is bilateral with the left being worst on the right. In the case of the left lower extremity the pain goes all the way down to the bottom of the foot in what seems to be an S1  dermatomal distribution. The  pattern is identical in the case of the right lower extremity, just not as bad. She denies any prior surgeries, nerve blocks, joint injections, physical therapy, or any recent x-rays.  The next area of pain is described to be that of the upper extremities with both arms being affected and the left being worst on the right. She indicates being right-handed. In the case of the left upper extremity the pain goes down to the index finger and the middle finger. In the case of the right upper extremity he goes primarily to the index finger. She again denies any prior surgeries, nerve blocks, joint injections, but does admit to having had physical therapy 6-8 months ago, which consisted of 2 sessions per week 6 weeks. This physical therapy seems to have worked, but as soon as she stopped it due to pain return. He denies any recent x-rays.  The next area of pain is that of the neck with the pain being posterior and bilateral. She describes the left side to be worse than the right. She denies any prior surgery, nerve blocks, joint injections, physical therapy, x-rays, and she also denies any shoulder pain or headaches.  In addition to the physical pain, she describes being "ill" all of the time due to her inability to do activities and how debilitating this condition has been. She indicates that the CMT has been in her family and her mother had it and her son also has it. She indicates having insomnia secondary to pain and having decreased appetite where she has lost approximately 5 pounds. The patient had some upper extremity nerve conduction test indicating that she has polyneuropathy of the upper extremities compatible with CMT.  According to the patient she has been treating her pain with Percocet 5 mg by mouth twice a day which was prescribed by Dr. Melrose Nakayama. When she takes a Percocet pill she indicates feeling the onset of the medication within an hour and the peak effect after  1.5-2 hours. The patient also indicates of the medication is completely gone after 4 hours. She denies any side effects and specifically she denies any constipation, nausea, or vomiting. She also denies any itching.  The patient was asked if there was any medication that had worked well for her in the past and she indicated that she had a physician in [place] that was prescribing Ativan for her 0.5 mg by mouth 3 times a day and this seems to have worked for her depressed. She indicated that it helped with her balance. However, her medical record indicates benzodiazepine dependence and abuse. I have informed the patient that we do not prescribe any type of benzodiazepines and that if she feels she may need this medication, then she will need a consult with a psychiatrist so that they can look at whether or not she has the indications for that medicine.  In considering the treatment plan options, Ms. Caissie was reminded that I no longer take patients for medication management only. I asked her to let me know if she had no intention of taking advantage of the interventional therapies, so that we could make arrangements to provide this space to someone interested. I also made it clear that undergoing interventional therapies for the purpose of getting pain medications is very inappropriate on the part of a patient, and it will not be tolerated in this practice. This type of behavior would suggest true addiction and therefore it requires referral to an addiction specialist.   Further details on both,  my assessment(s), as well as the proposed treatment plan, please see below.  Controlled Substance Pharmacotherapy Assessment REMS (Risk Evaluation and Mitigation Strategy)  Analgesic: Oxycodone IR 5 mg 1 tablet by mouth every 6 hours when necessary for pain (20 mg/day of oxycodone) (started 12/01/2016) Highest recorded MME/day: 30 mg/day MME/day: 30 mg/day Pill Count: None expected due to no prior  prescriptions written by our practice. Ignatius Specking, RN  12/01/2016  8:32 AM  Sign at close encounter Safety precautions to be maintained throughout the outpatient stay will include: orient to surroundings, keep bed in low position, maintain call bell within reach at all times, provide assistance with transfer out of bed and ambulation.    Pharmacokinetics: Liberation and absorption (onset of action): Longer than expected (1hr)(w/ food) Distribution (time to peak effect): Longer than expected (2hr) Metabolism and excretion (duration of action): WNL (4-5 hrs) Pharmacodynamics: Desired effects: Analgesia: Ms. Sirmons reports >50% benefit. (80%) Functional ability: Patient reports that medication allows her to accomplish basic ADLs Clinically meaningful improvement in function (CMIF): Sustained CMIF goals met Perceived effectiveness: Described as relatively effective, allowing for increase in activities of daily living (ADL) Undesirable effects: Side-effects or Adverse reactions: None reported Monitoring: [place] PMP: Online review of the past 67-monthperiod previously conducted. Not applicable at this point since we have not taken over the patient's medication management yet. List of all Serum Drug Screening Test(s):  No results found for: AMPHSCRSER, BARBSCRSER, BENZOSCRSER, COCAINSCRSER, PCPSCRSER, THCSCRSER, OPIATESCRSER, OXYSCRSER, POverlandList of all UDS test(s) done:  Lab Results  Component Value Date   SUMMARY FINAL 11/10/2016   Last UDS on record: Summary  Date Value Ref Range Status  11/10/2016 FINAL  Final    Comment:    ==================================================================== TOXASSURE COMP DRUG ANALYSIS,UR ==================================================================== Test                             Result       Flag       Units Drug Present and Declared for Prescription Verification   Oxycodone                      802          EXPECTED   ng/mg  creat   Oxymorphone                    350          EXPECTED   ng/mg creat   Noroxycodone                   578          EXPECTED   ng/mg creat   Noroxymorphone                 93           EXPECTED   ng/mg creat    Sources of oxycodone are scheduled prescription medications.    Oxymorphone, noroxycodone, and noroxymorphone are expected    metabolites of oxycodone. Oxymorphone is also available as a    scheduled prescription medication.   Acetaminophen                  PRESENT      EXPECTED   Atenolol                       PRESENT      EXPECTED Drug  Present not Declared for Prescription Verification   Lorazepam                      953          UNEXPECTED ng/mg creat    Source of lorazepam is a scheduled prescription medication.   Trazodone                      PRESENT      UNEXPECTED   Quetiapine                     PRESENT      UNEXPECTED   Ibuprofen                      PRESENT      UNEXPECTED ==================================================================== Test                      Result    Flag   Units      Ref Range   Creatinine              171              mg/dL      >=20 ==================================================================== Declared Medications:  The flagging and interpretation on this report are based on the  following declared medications.  Unexpected results may arise from  inaccuracies in the declared medications.  **Note: The testing scope of this panel includes these medications:  Atenolol  Oxycodone (Oxycodone Acetaminophen)  **Note: The testing scope of this panel does not include small to  moderate amounts of these reported medications:  Acetaminophen (Oxycodone Acetaminophen)  **Note: The testing scope of this panel does not include following  reported medications:  Lisinopril ==================================================================== For clinical consultation, please call [phone]. ====================================================================    UDS interpretation: No unexpected findings.          Medication Assessment Form: Patient introduced to form today Treatment compliance: Treatment may start today if patient agrees with proposed plan. Evaluation of compliance is not applicable at this point Risk Assessment Profile: Aberrant behavior: See initial evaluations. None observed or detected today Comorbid factors increasing risk of overdose: See initial evaluation. No additional risks detected today Medical Psychology Evaluation: Please see scanned results in medical record.     Opioid Risk Tool - 12/01/16 0844      Family History of Substance Abuse   Alcohol Positive Female   Illegal Drugs Negative   Rx Drugs Negative     Personal History of Substance Abuse   Alcohol Negative   Illegal Drugs Negative   Rx Drugs Negative     Age   Age between 67-45 years  No     History of Preadolescent Sexual Abuse   History of Preadolescent Sexual Abuse Negative or Female     Psychological Disease   Psychological Disease Negative   Depression Negative     Total Score   Opioid Risk Tool Scoring 1   Opioid Risk Interpretation Low Risk     ORT Scoring interpretation table:  Score <3 = Low Risk for SUD  Score between 4-7 = Moderate Risk for SUD  Score >8 = High Risk for Opioid Abuse   Risk Mitigation Strategies:  Patient opioid safety counseling: Completed today. Counseling provided to patient as per "Patient Counseling Document". Document signed by patient, attesting to counseling and understanding Patient-Prescriber Agreement (PPA): Obtained today.  Controlled substance  notification to other providers: Written and sent today.  Pharmacologic Plan: Today we may be taking over the patient's pharmacological regimen. See below             Laboratory Chemistry  Inflammation Markers (CRP: Acute Phase) (ESR: Chronic Phase) Lab Results  Component  Value Date   CRP 1.2 11/10/2016   ESRSEDRATE 5 11/10/2016                 Renal Function Markers Lab Results  Component Value Date   BUN 18 11/10/2016   CREATININE 0.87 11/10/2016   GFRAA 83 11/10/2016   GFRNONAA 72 11/10/2016                 Hepatic Function Markers Lab Results  Component Value Date   AST 11 11/10/2016   ALT 13 (L) 12/31/2015   ALBUMIN 4.2 11/10/2016   ALKPHOS 81 11/10/2016                 Electrolytes Lab Results  Component Value Date   NA 141 11/10/2016   K 3.8 11/10/2016   CL 100 11/10/2016   CALCIUM 9.3 11/10/2016   MG 2.0 11/10/2016                 Neuropathy Markers Lab Results  Component Value Date   VITAMINB12 615 11/10/2016                 Bone Pathology Markers Lab Results  Component Value Date   ALKPHOS 81 11/10/2016   VD25OH 13.5 (L) 12/31/2015   25OHVITD1 13 (L) 11/10/2016   25OHVITD2 <1.0 11/10/2016   25OHVITD3 13 11/10/2016   CALCIUM 9.3 11/10/2016                 Coagulation Parameters Lab Results  Component Value Date   INR 1.0 12/30/2013   LABPROT 13.2 12/30/2013   APTT 28.9 12/30/2013   PLT 214 12/31/2015                 Cardiovascular Markers Lab Results  Component Value Date   HGB 13.5 12/31/2015   HCT 39.3 12/31/2015                 Note: Lab results reviewed.  Recent Diagnostic Imaging Review  Cervical Imaging: Cervical CT wo contrast:  Results for orders placed during the hospital encounter of 12/04/15  CT Cervical Spine Wo Contrast   Narrative CLINICAL DATA:  Left facial weakness and facial droop with slurred speech. Multiple falls in the last 2 weeks.  EXAM: CT HEAD WITHOUT CONTRAST  CT CERVICAL SPINE WITHOUT CONTRAST  TECHNIQUE: Multidetector CT imaging of the head and cervical spine was performed following the standard protocol without intravenous contrast. Multiplanar CT image reconstructions of the cervical spine were also generated.  COMPARISON:  11/02/2015  FINDINGS: CT HEAD  FINDINGS  Brain: Remote lacunar infarcts in the right pons, left lentiform nucleus and head of the left caudate nucleus, left anterior internal capsule, and right lentiform nucleus, not appreciably changed from prior.  Otherwise, The brainstem, cerebellum, cerebral peduncles, thalami, basal ganglia, basilar cisterns, and ventricular system appear within normal limits. Periventricular white matter and corona radiata hypodensities favor chronic ischemic microvascular white matter disease. No intracranial hemorrhage, mass lesion, or acute CVA.  Vascular: Mildly ectatic basilar artery. Minimal atherosclerotic calcification of the cavernous carotid arteries.  Skull: Unremarkable  Sinuses/Orbits: Unremarkable  Other: Incidental note is made of a tiny amount of gas in the superior ophthalmic vein,  probably benign/iatrogenic gas from IV placement  CT CERVICAL SPINE FINDINGS  Alignment: 2.5 mm degenerative anterolisthesis at C5-6, unchanged.  Skull base and vertebrae: Posterior spurring at C6-7, unchanged. Loss of disc height at C6-7. No fracture or acute subluxation identified.  Soft tissues and spinal canal: Unremarkable  Disc levels:  No impingement identified.  Upper chest: Unremarkable  Other: No supplemental non-categorized findings.  IMPRESSION: 1. No acute intracranial findings or acute cervical spine findings. 2. Remote lacunar infarcts involving the right pons, bilateral lentiform nuclei, and left caudate head. Not changed from prior. 3. Periventricular white matter and corona radiata hypodensities favor chronic ischemic microvascular white matter disease. 4. Spondylosis and degenerative disc disease most notable at C6-7. 2.5 mm degenerative retrolisthesis at C5-6, unchanged.   Electronically Signed   By: Van Clines M.D.   On: 12/04/2015 12:19    Cervical DG Bending/F/E views:  Results for orders placed during the hospital encounter of 11/10/16  DG  Cervical Spine With Flex & Extend   Narrative CLINICAL DATA:  History of osteoarthritis.  EXAM: CERVICAL SPINE COMPLETE WITH FLEXION AND EXTENSION VIEWS  COMPARISON:  Cervical spine CT - 12/04/2015  FINDINGS: C1 to the superior endplate of T1 is imaged on the provided neutral radiograph.  There is minimal (approximately 3 mm) of retrolisthesis of C3 upon C4. The amount of retrolisthesis is unchanged given the acquired degrees of flexion and extension. No additional areas of anterolisthesis or retrolisthesis are elicited given the acquired degrees of flexion and extension. The bilateral facets appear normally aligned. The dens is normally positioned between the lateral masses of C1.  Cervical vertebral body heights appear preserved. Prevertebral soft tissues appear normal  Moderate multilevel cervical spine DDD throughout the cervical spine.  The bilateral neural foramina appear patent given obliquity.  Regional soft tissues appear normal. Limited visualization of the lung apices is normal.  IMPRESSION: 1. Moderate multilevel cervical spine DDD. 2. No evidence of dynamic cervical spine instability given the acquired degrees of flexion and extension   Electronically Signed   By: Sandi Mariscal M.D.   On: 11/10/2016 17:21    Shoulder Imaging: Shoulder-R DG:  Results for orders placed in visit on 11/27/05  DG Shoulder Right   Narrative * PRIOR REPORT IMPORTED FROM AN EXTERNAL SYSTEM *   PRIOR REPORT IMPORTED FROM THE [facility] WORKFLOW SYSTEM   REASON FOR EXAM:    pain  COMMENTS:   PROCEDURE:     DXR - DXR SHOULDER RIGHT COMPLETE  - Nov 28 2005  8:55AM   RESULT:          Views of the RIGHT shoulder reveal the bones to be  adequately mineralized.  There is no evidence of fracture or dislocation.  The overlying soft tissues are normal in appearance.   IMPRESSION:          I see no acute bony abnormality of the RIGHT  shoulder.   Thank you for this opportunity to  contribute to the care of your patient.       Lumbosacral Imaging: Lumbar DG 2-3 views:  Results for orders placed during the hospital encounter of 12/04/15  DG Lumbar Spine 2-3 Views   Narrative CLINICAL DATA:  Multiple falls over the last month. Diffuse low back pain.  EXAM: LUMBAR SPINE - 2-3 VIEW  COMPARISON:  12/25/2007  FINDINGS: No significant malalignment. No evidence of fracture. Mild disc space narrowing L5-S1. Mild lower lumbar facet osteoarthritis. Some aortic atherosclerotic calcification.  IMPRESSION: No acute or  traumatic finding. Mild lower lumbar degenerative disc disease and degenerative facet disease.  Aortic atherosclerosis.   Electronically Signed   By: Nelson Chimes M.D.   On: 12/04/2015 12:02    Knee Imaging: Knee-R DG 4 views:  Results for orders placed during the hospital encounter of 12/04/15  DG Knee Complete 4 Views Right   Narrative CLINICAL DATA:  Multiple falls over the last month, most recently yesterday. Anterior pain and swelling.  EXAM: RIGHT KNEE - COMPLETE 4+ VIEW  COMPARISON:  None.  FINDINGS: No fracture. Small joint effusion. There is degenerative chondrocalcinosis of the menisci. No joint space narrowing or osteophyte formation.  IMPRESSION: No acute traumatic bone finding. Small joint effusion. Degenerative chondrocalcinosis of the menisci.   Electronically Signed   By: Nelson Chimes M.D.   On: 12/04/2015 12:03    Complexity Note: Imaging results reviewed. Results shared with Ms. Sommers, using Layman's terms.                         Meds   Current Outpatient Prescriptions:  .  atenolol (TENORMIN) 100 MG tablet, Take by mouth daily. , Disp: , Rfl:  .  ibuprofen (ADVIL,MOTRIN) 200 MG tablet, Take 200 mg by mouth every 6 (six) hours as needed., Disp: , Rfl:  .  lisinopril (PRINIVIL,ZESTRIL) 10 MG tablet, Take 10 mg by mouth. , Disp: , Rfl:  .  Calcium Carb-Cholecalciferol (CALCIUM PLUS D3 ABSORBABLE) [phone]  MG-UNIT CAPS, Take 1 capsule by mouth daily with breakfast., Disp: 90 capsule, Rfl: 0 .  Cholecalciferol 5000 units capsule, Take 1 capsule (5,000 Units total) by mouth daily., Disp: 90 capsule, Rfl: 0 .  [START ON 12/02/2016] ergocalciferol (VITAMIN D2) 50000 units capsule, Take 1 capsule (50,000 Units total) by mouth 2 (two) times a week. X 6 weeks., Disp: 12 capsule, Rfl: 0 .  oxyCODONE (OXY IR/ROXICODONE) 5 MG immediate release tablet, Take 1 tablet (5 mg total) by mouth every 6 (six) hours as needed for severe pain., Disp: 120 tablet, Rfl: 0  ROS  Constitutional: Denies any fever or chills Gastrointestinal: No reported hemesis, hematochezia, vomiting, or acute GI distress Musculoskeletal: Denies any acute onset joint swelling, redness, loss of ROM, or weakness Neurological: No reported episodes of acute onset apraxia, aphasia, dysarthria, agnosia, amnesia, paralysis, loss of coordination, or loss of consciousness  Allergies  Ms. Stankovic is allergic to penicillins.  [place]  Drug: Ms. Annas  reports that she does not use drugs. Alcohol:  reports that she drinks about 0.6 oz of alcohol per week . Tobacco:  reports that she has never smoked. She has never used smokeless tobacco. Medical:  has a past medical history of Abdominal pain (12/22/2012); Abnormal EKG (12/11/2011); Altered mental status (12/11/2011); Anxiety; Benzodiazepine dependence ([place]) (12/14/2011); Charcot-Marie disease; Chronic anxiety (12/14/2011); Dehydration (12/11/2011); Diastolic dysfunction (40/98/1191); Drug withdrawal ([place]) (12/12/2011); Hypertension; Hypokalemia (12/11/2011); and Psychosis ([place]) (12/12/2011). Surgical: Ms. Capurro  has a past surgical history that includes Abdominal hysterectomy. Family: family history includes Charcot-Marie-Tooth disease in her mother and son; Heart disease in her father.  Constitutional Exam  General appearance: Well nourished, well developed, and well hydrated. In no apparent acute  distress Vitals:   12/01/16 0831 12/01/16 0834  BP:  (!) 213/104  Pulse: (!) 50   Resp: 16   Temp: 98.1 F (36.7 C)   SpO2: 97%   Weight: 142 lb (64.4 kg)   Height: _0  (1.626 m)    BMI Assessment: Estimated body mass  index is 24.37 kg/m as calculated from the following:   Height as of this encounter: _0  (1.626 m).   Weight as of this encounter: 142 lb (64.4 kg).  BMI interpretation table: BMI level Category Range association with higher incidence of chronic pain  <18 kg/m2 Underweight   18.5-24.9 kg/m2 Ideal body weight   25-29.9 kg/m2 Overweight Increased incidence by 20%  30-34.9 kg/m2 Obese (Class I) Increased incidence by 68%  35-39.9 kg/m2 Severe obesity (Class II) Increased incidence by 136%  >40 kg/m2 Extreme obesity (Class III) Increased incidence by 254%   BMI Readings from Last 4 Encounters:  12/01/16 24.37 kg/m  11/10/16 25.40 kg/m  12/31/15 25.44 kg/m  12/04/15 23.30 kg/m   Wt Readings from Last 4 Encounters:  12/01/16 142 lb (64.4 kg)  11/10/16 148 lb (67.1 kg)  12/31/15 148 lb 3.2 oz (67.2 kg)  12/04/15 140 lb (63.5 kg)  Psych/Mental status: Alert, oriented x 3 (person, place, & time)       Eyes: PERLA Respiratory: No evidence of acute respiratory distress  Cervical Spine Area Exam  Skin & Axial Inspection: No masses, redness, edema, swelling, or associated skin lesions Alignment: Symmetrical Functional ROM: Decreased ROM      Stability: No instability detected Muscle Tone/Strength: Functionally intact. No obvious neuro-muscular anomalies detected. Sensory (Neurological): Movement-associated pain Palpation: Complains of area being tender to palpation              Upper Extremity (UE) Exam    Side: Right upper extremity  Side: Left upper extremity  Skin & Extremity Inspection: Skin color, temperature, and hair growth are WNL. No peripheral edema or cyanosis. No masses, redness, swelling, asymmetry, or associated skin lesions. No contractures.   Skin & Extremity Inspection: Skin color, temperature, and hair growth are WNL. No peripheral edema or cyanosis. No masses, redness, swelling, asymmetry, or associated skin lesions. No contractures.  Functional ROM: Unrestricted ROM          Functional ROM: Unrestricted ROM          Muscle Tone/Strength: Functionally intact. No obvious neuro-muscular anomalies detected.  Muscle Tone/Strength: Functionally intact. No obvious neuro-muscular anomalies detected.  Sensory (Neurological): Unimpaired          Sensory (Neurological): Unimpaired          Palpation: No palpable anomalies              Palpation: No palpable anomalies              Specialized Test(s): Deferred         Specialized Test(s): Deferred          Thoracic Spine Area Exam  Skin & Axial Inspection: No masses, redness, or swelling Alignment: Symmetrical Functional ROM: Unrestricted ROM Stability: No instability detected Muscle Tone/Strength: Functionally intact. No obvious neuro-muscular anomalies detected. Sensory (Neurological): Unimpaired Muscle strength & Tone: No palpable anomalies  Lumbar Spine Area Exam  Skin & Axial Inspection: No masses, redness, or swelling Alignment: Symmetrical Functional ROM: Unrestricted ROM      Stability: No instability detected Muscle Tone/Strength: Functionally intact. No obvious neuro-muscular anomalies detected. Sensory (Neurological): Unimpaired Palpation: No palpable anomalies       Provocative Tests: Lumbar Hyperextension and rotation test: evaluation deferred today       Lumbar Lateral bending test: evaluation deferred today       Patrick's Maneuver: evaluation deferred today  Gait & Posture Assessment  Ambulation: Unassisted Gait: Cautious Posture: WNL   Lower Extremity Exam    Side: Right lower extremity  Side: Left lower extremity  Skin & Extremity Inspection: Skin color, temperature, and hair growth are WNL. No peripheral edema or cyanosis. No masses,  redness, swelling, asymmetry, or associated skin lesions. No contractures.  Skin & Extremity Inspection: Skin color, temperature, and hair growth are WNL. No peripheral edema or cyanosis. No masses, redness, swelling, asymmetry, or associated skin lesions. No contractures.  Functional ROM: Unrestricted ROM          Functional ROM: Unrestricted ROM          Muscle Tone/Strength: Functionally intact. No obvious neuro-muscular anomalies detected.  Muscle Tone/Strength: Functionally intact. No obvious neuro-muscular anomalies detected.  Sensory (Neurological): Unimpaired  Sensory (Neurological): Unimpaired  Palpation: No palpable anomalies  Palpation: No palpable anomalies   Assessment & Plan  Primary Diagnosis & Pertinent Problem List: The primary encounter diagnosis was Chronic pain syndrome. 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  Visit Diagnosis: 1. Chronic pain syndrome   2. CMT (Charcot-Marie-Tooth disease)   3. Chronic lower extremity pain (Primary Area of Pain)  (Bilateral) (L>R)   4. DDD (degenerative disc disease), lumbar   5. Chronic lumbar radiculopathy (L5/S1) (Left)   6. Chronic knee pain (Right)   7. Chronic upper extremity pain (Secondary Area of Pain) (Bilateral) (L>R)   8. Cervical Grade 1 Retrolisthesis of C5 over C6   9. Chronic shoulder pain (Right)   10. Chronic upper extremity pain (Left)   11. Chronic upper extremity polyneuropathy (severe) (CMT)   12. 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HCC)   13. 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 Abnormal nerve conduction studies   15. Chronic hand pain (Bilateral) (L>R)   16. Chronic neck pain (Tertiary Area of Pain) (Posterior) (Bilateral) (L>R)   17. DDD (degenerative disc disease), cervical   18. Cervical spondylosis   19. Long term prescription benzodiazepine use   20. Opiate use   21. HTN (hypertension), benign   22. NSAID long-term use   23. Generalized muscle weakness   24. 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   Abnormal Nerve Conduction Studies   Impression: Abnormal study.There is electrodiagnostic evidence of a severe generalized mixed polyneuropathy in the upper extremities, most compatible with patient's known history of CMT.   Cmt (Charcot-Marie-Tooth Disease)  Long Term Prescription Opiate Use  Opiate  Use  Long Term Prescription Benzodiazepine Use  Nsaid Long-Term Use  Vitamin D Deficiency   Time Note: Greater than 50% of the 40 minute(s) of face-to-face time spent with Ms. Pavlicek, was spent in counseling/coordination of care  regarding: the appropriate use of the pain scale, Ms. Sheu primary cause of pain, the results of her recent test(s), the significance of each one oth the test(s) anomalies and it's corresponding characteristic pain pattern(s), the treatment plan, the risks and possible complications of proposed treatment, medication side effects, the opioid analgesic risks and possible complications, the goals of pain management (increased in functionality), the medication agreement and the need to collect and read the AVS material.  Plan of Care  Pharmacotherapy (Medications Ordered): Meds ordered this encounter  Medications  . oxyCODONE (OXY IR/ROXICODONE) 5 MG immediate release tablet    Sig: Take 1 tablet (5 mg total) by mouth every 6 (six) hours as needed for severe pain.    Dispense:  120 tablet    Refill:  0    Do not place this medication, or any other prescription from our practice, on "Automatic Refill". Patient may have prescription filled one day early if pharmacy is closed on scheduled refill date. Do not fill until: 12/01/16 To last until: 12/31/16  . Calcium Carb-Cholecalciferol (CALCIUM PLUS D3 ABSORBABLE) [phone] MG-UNIT CAPS    Sig: Take 1 capsule by mouth daily with breakfast.    Dispense:  90 capsule    Refill:  0    Do not place medication on "Automatic Refill". Fill one day early if pharmacy is closed on scheduled refill date.  . ergocalciferol (VITAMIN D2) 50000 units capsule    Sig: Take 1 capsule (50,000 Units total) by mouth 2 (two) times a week. X 6 weeks.    Dispense:  12 capsule    Refill:  0    Do not add this medication to the electronic "Automatic Refill" notification system. Patient may have prescription filled one day early if pharmacy is closed on scheduled refill date.  . Cholecalciferol 5000 units capsule    Sig: Take 1 capsule (5,000 Units total) by mouth daily.    Dispense:  90 capsule    Refill:  0    Do not place medication on "Automatic Refill". Fill one day  early if pharmacy is closed on scheduled refill date.    Procedure Orders     Cervical Epidural Injection Lab Orders  No laboratory test(s) ordered today   Imaging Orders  No imaging studies ordered today   Referral Orders  No referral(s) requested today    Pharmacological management options:  Opioid Analgesics: We'll take over management today. See above orders Membrane stabilizer: We have discussed the possibility of optimizing this mode of therapy, if tolerated Muscle relaxant: We have discussed the possibility of a trial NSAID: We have discussed the possibility of a trial Other analgesic(s): To be determined at a later time   Interventional management options: Planned, scheduled, and/or pending:    Diagnostic left sided cervical epidural steroid injection    Considering:   Diagnostic right-sided L5-S1 lumbar epidural steroid injection  Diagnostic bilateral lumbar facet block  Possible bilateral lumbar facet RFA  Diagnostic left sided cervical epidural steroid injection  Diagnostic bilateral cervical facet block  Possible bilateral cervical facet RFA  Diagnostic right shoulder injection  Diagnostic right suprascapular nerve block  Possible right sided suprascapular nerve RFA    PRN Procedures:   None at this time   Provider-requested follow-up: Return  for Procedure (w/ sedation): (L) CESI.  Future Appointments Date Time Provider [place]  12/14/2016 9:45 AM Milinda Pointer, MD [facility] None  12/29/2016 10:15 AM Milinda Pointer, MD [facility] None    Primary Care Physician: Ricardo Jericho, NP Location: [facility] Note by: Gaspar Cola, MD Date: 12/01/2016; Time: 10:16 AM

## 2016-12-01 ENCOUNTER — Encounter: Payer: Self-pay | Admitting: Pain Medicine

## 2016-12-01 ENCOUNTER — Ambulatory Visit: Payer: Medicare Other | Attending: Pain Medicine | Admitting: Pain Medicine

## 2016-12-01 VITALS — BP 213/104 | HR 50 | Temp 98.1°F | Resp 16 | Ht 64.0 in | Wt 142.0 lb

## 2016-12-01 DIAGNOSIS — G600 Hereditary motor and sensory neuropathy: Secondary | ICD-10-CM | POA: Diagnosis not present

## 2016-12-01 DIAGNOSIS — M4310 Spondylolisthesis, site unspecified: Secondary | ICD-10-CM | POA: Diagnosis not present

## 2016-12-01 DIAGNOSIS — M5116 Intervertebral disc disorders with radiculopathy, lumbar region: Secondary | ICD-10-CM | POA: Diagnosis not present

## 2016-12-01 DIAGNOSIS — M5416 Radiculopathy, lumbar region: Secondary | ICD-10-CM

## 2016-12-01 DIAGNOSIS — Z791 Long term (current) use of non-steroidal anti-inflammatories (NSAID): Secondary | ICD-10-CM | POA: Diagnosis not present

## 2016-12-01 DIAGNOSIS — M25541 Pain in joints of right hand: Secondary | ICD-10-CM | POA: Insufficient documentation

## 2016-12-01 DIAGNOSIS — I700 Atherosclerosis of aorta: Secondary | ICD-10-CM | POA: Diagnosis not present

## 2016-12-01 DIAGNOSIS — M25561 Pain in right knee: Secondary | ICD-10-CM | POA: Diagnosis not present

## 2016-12-01 DIAGNOSIS — M542 Cervicalgia: Secondary | ICD-10-CM

## 2016-12-01 DIAGNOSIS — E559 Vitamin D deficiency, unspecified: Secondary | ICD-10-CM

## 2016-12-01 DIAGNOSIS — G629 Polyneuropathy, unspecified: Secondary | ICD-10-CM | POA: Diagnosis not present

## 2016-12-01 DIAGNOSIS — M4312 Spondylolisthesis, cervical region: Secondary | ICD-10-CM | POA: Diagnosis not present

## 2016-12-01 DIAGNOSIS — M79605 Pain in left leg: Secondary | ICD-10-CM

## 2016-12-01 DIAGNOSIS — M79643 Pain in unspecified hand: Secondary | ICD-10-CM

## 2016-12-01 DIAGNOSIS — M25542 Pain in joints of left hand: Secondary | ICD-10-CM | POA: Diagnosis not present

## 2016-12-01 DIAGNOSIS — G894 Chronic pain syndrome: Secondary | ICD-10-CM

## 2016-12-01 DIAGNOSIS — M79601 Pain in right arm: Secondary | ICD-10-CM

## 2016-12-01 DIAGNOSIS — F4323 Adjustment disorder with mixed anxiety and depressed mood: Secondary | ICD-10-CM | POA: Diagnosis not present

## 2016-12-01 DIAGNOSIS — G608 Other hereditary and idiopathic neuropathies: Secondary | ICD-10-CM

## 2016-12-01 DIAGNOSIS — M5136 Other intervertebral disc degeneration, lumbar region: Secondary | ICD-10-CM

## 2016-12-01 DIAGNOSIS — R94130 Abnormal response to nerve stimulation, unspecified: Secondary | ICD-10-CM

## 2016-12-01 DIAGNOSIS — Z79891 Long term (current) use of opiate analgesic: Secondary | ICD-10-CM | POA: Insufficient documentation

## 2016-12-01 DIAGNOSIS — M47812 Spondylosis without myelopathy or radiculopathy, cervical region: Secondary | ICD-10-CM | POA: Diagnosis not present

## 2016-12-01 DIAGNOSIS — Z79899 Other long term (current) drug therapy: Secondary | ICD-10-CM

## 2016-12-01 DIAGNOSIS — M5030 Other cervical disc degeneration, unspecified cervical region: Secondary | ICD-10-CM | POA: Diagnosis not present

## 2016-12-01 DIAGNOSIS — M25511 Pain in right shoulder: Secondary | ICD-10-CM | POA: Diagnosis not present

## 2016-12-01 DIAGNOSIS — M79604 Pain in right leg: Secondary | ICD-10-CM

## 2016-12-01 DIAGNOSIS — G950 Syringomyelia and syringobulbia: Secondary | ICD-10-CM

## 2016-12-01 DIAGNOSIS — M79602 Pain in left arm: Secondary | ICD-10-CM

## 2016-12-01 DIAGNOSIS — I10 Essential (primary) hypertension: Secondary | ICD-10-CM

## 2016-12-01 DIAGNOSIS — E876 Hypokalemia: Secondary | ICD-10-CM | POA: Insufficient documentation

## 2016-12-01 DIAGNOSIS — M5117 Intervertebral disc disorders with radiculopathy, lumbosacral region: Secondary | ICD-10-CM | POA: Insufficient documentation

## 2016-12-01 DIAGNOSIS — M51369 Other intervertebral disc degeneration, lumbar region without mention of lumbar back pain or lower extremity pain: Secondary | ICD-10-CM

## 2016-12-01 DIAGNOSIS — G8929 Other chronic pain: Secondary | ICD-10-CM

## 2016-12-01 DIAGNOSIS — M4722 Other spondylosis with radiculopathy, cervical region: Secondary | ICD-10-CM

## 2016-12-01 DIAGNOSIS — F1190 Opioid use, unspecified, uncomplicated: Secondary | ICD-10-CM

## 2016-12-01 DIAGNOSIS — M6281 Muscle weakness (generalized): Secondary | ICD-10-CM | POA: Insufficient documentation

## 2016-12-01 MED ORDER — ERGOCALCIFEROL 1.25 MG (50000 UT) PO CAPS
50000.0000 [IU] | ORAL_CAPSULE | ORAL | 0 refills | Status: DC
Start: 2016-12-02 — End: 2016-12-29

## 2016-12-01 MED ORDER — OXYCODONE HCL 5 MG PO TABS: 5.0000 mg | ORAL_TABLET | Freq: Four times a day (QID) | ORAL | 0 refills | Status: DC | PRN

## 2016-12-01 MED ORDER — CHOLECALCIFEROL 125 MCG (5000 UT) PO CAPS: 5000.0000 [IU] | ORAL_CAPSULE | Freq: Every day | ORAL | 0 refills | Status: AC

## 2016-12-01 MED ORDER — CALCIUM PLUS D3 ABSORBABLE 600-2500 MG-UNIT PO CAPS: 1.0000 | ORAL_CAPSULE | Freq: Every day | ORAL | 0 refills | Status: DC

## 2016-12-01 NOTE — Progress Notes (Signed)
Safety precautions to be maintained throughout the outpatient stay will include: orient to surroundings, keep bed in low position, maintain call bell within reach at all times, provide assistance with transfer out of bed and ambulation.  

## 2016-12-01 NOTE — Patient Instructions (Addendum)
____________________________________________________________________________________________  Medication Rules  Applies to: All patients receiving prescriptions (written or electronic).  Pharmacy of record: Pharmacy where electronic prescriptions will be sent. If written prescriptions are taken to a different pharmacy, please inform the nursing staff. The pharmacy listed in the electronic medical record should be the one where you would like electronic prescriptions to be sent.  Prescription refills: Only during scheduled appointments. Applies to both, written and electronic prescriptions.  NOTE: The following applies primarily to controlled substances (Opioid* Pain Medications).   Patient's responsibilities: 1. Pain Pills: Bring all pain pills to every appointment (except for procedure appointments). 2. Pill Bottles: Bring pills in original pharmacy bottle. Always bring newest bottle. Bring bottle, even if empty. 3. Medication refills: You are responsible for knowing and keeping track of what medications you need refilled. The day before your appointment, write a list of all prescriptions that need to be refilled. Bring that list to your appointment and give it to the admitting nurse. Prescriptions will be written only during appointments. If you forget a medication, it will not be "Called in", "Faxed", or "electronically sent". You will need to get another appointment to get these prescribed. 4. Prescription Accuracy: You are responsible for carefully inspecting your prescriptions before leaving our office. Have the discharge nurse carefully go over each prescription with you, before taking them home. Make sure that your name is accurately spelled, that your address is correct. Check the name and dose of your medication to make sure it is accurate. Check the number of pills, and the written instructions to make sure they are clear and accurate. Make sure that you are given enough medication to  last until your next medication refill appointment. 5. Taking Medication: Take medication as prescribed. Never take more pills than instructed. Never take medication more frequently than prescribed. Taking less pills or less frequently is permitted and encouraged, when it comes to controlled substances (written prescriptions).  6. Inform other Doctors: Always inform, all of your healthcare providers, of all the medications you take. 7. Pain Medication from other Providers: You are not allowed to accept any additional pain medication from any other Doctor or Healthcare provider. There are two exceptions to this rule. (see below) In the event that you require additional pain medication, you are responsible for notifying us, as stated below. 8. Medication Agreement: You are responsible for carefully reading and following our Medication Agreement. This must be signed before receiving any prescriptions from our practice. Safely store a copy of your signed Agreement. Violations to the Agreement will result in no further prescriptions. (Additional copies of our Medication Agreement are available upon request.) 9. Laws, Rules, & Regulations: All patients are expected to follow all Federal and State Laws, Statutes, Rules, & Regulations. Ignorance of the Laws does not constitute a valid excuse. The use of any illegal substances is prohibited. 10. Adopted CDC guidelines & recommendations: Target dosing levels will be at or below 60 MME/day. Use of benzodiazepines** is not recommended.  Exceptions: There are only two exceptions to the rule of not receiving pain medications from other Healthcare Providers. 1. Exception #1 (Emergencies): In the event of an emergency (i.e.: accident requiring emergency care), you are allowed to receive additional pain medication. However, you are responsible for: As soon as you are able, call our office [phone], at any time of the day or night, and leave a message stating your  name, the date and nature of the emergency, and the name and dose of the medication   prescribed. In the event that your call is answered by a member of our staff, make sure to document and save the date, time, and the name of the person that took your information.  2. Exception #2 (Planned Surgery): In the event that you are scheduled by another doctor or dentist to have any type of surgery or procedure, you are allowed (for a period no longer than 30 days), to receive additional pain medication, for the acute post-op pain. However, in this case, you are responsible for picking up a copy of our "Post-op Pain Management for Surgeons" handout, and giving it to your surgeon or dentist. This document is available at our office, and does not require an appointment to obtain it. Simply go to our office during business hours (Monday-Thursday from 8:00 AM to 4:00 PM) (Friday 8:00 AM to 12:00 Noon) or if you have a scheduled appointment with [place], prior to your surgery, and ask for it by name. In addition, you will need to provide [place] with your name, name of your surgeon, type of surgery, and date of procedure or surgery.  *Opioid medications include: morphine, codeine, oxycodone, oxymorphone, hydrocodone, hydromorphone, meperidine, tramadol, tapentadol, buprenorphine, fentanyl, methadone. **Benzodiazepine medications include: diazepam (Valium), alprazolam (Xanax), clonazepam (Klonopine), lorazepam (Ativan), clorazepate (Tranxene), chlordiazepoxide (Librium), estazolam (Prosom), oxazepam (Serax), temazepam (Restoril), triazolam (Halcion)  ____________________________________________________________________________________________ ____________________________________________________________________________________________  Preparing for Procedure with Sedation Instructions: . Oral Intake: Do not eat or drink anything for at least 8 hours prior to your procedure. . Transportation: Public transportation is not allowed.  Bring an adult driver. The driver must be physically present in our waiting room before any procedure can be started. [place] Physical Assistance: Bring an adult physically capable of assisting you, in the event you need help. This adult should keep you company at home for at least 6 hours after the procedure. . Blood Pressure Medicine: Take your blood pressure medicine with a sip of water the morning of the procedure. . Blood thinners:  . Diabetics on insulin: Notify the staff so that you can be scheduled 1st case in the morning. If your diabetes requires high dose insulin, take only  of your normal insulin dose the morning of the procedure and notify the staff that you have done so. . Preventing infections: Shower with an antibacterial soap the morning of your procedure. . Build-up your immune system: Take 1000 mg of Vitamin C with every meal (3 times a day) the day prior to your procedure. Marland Kitchen Antibiotics: Inform the staff if you have a condition or reason that requires you to take antibiotics before dental procedures. . Pregnancy: If you are pregnant, call and cancel the procedure. . Sickness: If you have a cold, fever, or any active infections, call and cancel the procedure. . Arrival: You must be in the facility at least 30 minutes prior to your scheduled procedure. . Children: Do not bring children with you. . Dress appropriately: Bring dark clothing that you would not mind if they get stained. . Valuables: Do not bring any jewelry or valuables. Procedure appointments are reserved for interventional treatments only. Marland Kitchen No Prescription Refills. . No medication changes will be discussed during procedure appointments. . No disability issues will be discussed. ____________________________________________________________________________________________  Pain Management Discharge Instructions  General Discharge Instructions :  If you need to reach your doctor call: Monday-Friday 8:00 am - 4:00 pm at  [phone] or toll free [phone].  After clinic hours [phone] to have operator reach doctor.  Bring all of your medication  bottles to all your appointments in the pain clinic.  To cancel or reschedule your appointment with Pain Management please remember to call 24 hours in advance to avoid a fee.  Refer to the educational materials which you have been given on: General Risks, I had my Procedure. Discharge Instructions, Post Sedation.  Post Procedure Instructions:  The drugs you were given will stay in your system until tomorrow, so for the next 24 hours you should not drive, make any legal decisions or drink any alcoholic beverages.  You may eat anything you prefer, but it is better to start with liquids then soups and crackers, and gradually work up to solid foods.  Please notify your doctor immediately if you have any unusual bleeding, trouble breathing or pain that is not related to your normal pain.  Depending on the type of procedure that was done, some parts of your body may feel week and/or numb.  This usually clears up by tonight or the next day.  Walk with the use of an assistive device or accompanied by an adult for the 24 hours.  You may use ice on the affected area for the first 24 hours.  Put ice in a Ziploc bag and cover with a towel and place against area 15 minutes on 15 minutes off.  You may switch to heat after 24 hours. Epidural Steroid Injection An epidural steroid injection is a shot of steroid medicine and numbing medicine that is given into the space between the spinal cord and the bones in your back (epidural space). The shot helps relieve pain caused by an irritated or swollen nerve root. The amount of pain relief you get from the injection depends on what is causing the nerve to be swollen and irritated, and how long your pain lasts. You are more likely to benefit from this injection if your pain is strong and comes on suddenly rather than if you have  had pain for a long time. Tell a health care provider about:  Any allergies you have.  All medicines you are taking, including vitamins, herbs, eye drops, creams, and over-the-counter medicines.  Any problems you or family members have had with anesthetic medicines.  Any blood disorders you have.  Any surgeries you have had.  Any medical conditions you have.  Whether you are pregnant or may be pregnant. What are the risks? Generally, this is a safe procedure. However, problems may occur, including:  Headache.  Bleeding.  Infection.  Allergic reaction to medicines.  Damage to your nerves.  What happens before the procedure? Staying hydrated Follow instructions from your health care provider about hydration, which may include:  Up to 2 hours before the procedure - you may continue to drink clear liquids, such as water, clear fruit juice, black coffee, and plain tea.  Eating and drinking restrictions Follow instructions from your health care provider about eating and drinking, which may include:  8 hours before the procedure - stop eating heavy meals or foods such as meat, fried foods, or fatty foods.  6 hours before the procedure - stop eating light meals or foods, such as toast or cereal.  6 hours before the procedure - stop drinking milk or drinks that contain milk.  2 hours before the procedure - stop drinking clear liquids.  Medicine  You may be given medicines to lower anxiety.  Ask your health care provider about: ? Changing or stopping your regular medicines. This is especially important if you are taking diabetes medicines  or blood thinners. ? Taking medicines such as aspirin and ibuprofen. These medicines can thin your blood. Do not take these medicines before your procedure if your health care provider instructs you not to. General instructions  Plan to have someone take you home from the hospital or clinic. What happens during the procedure?  You may  receive a medicine to help you relax (sedative).  You will be asked to lie on your abdomen.  The injection site will be cleaned.  A numbing medicine (local anesthetic) will be used to numb the injection site.  A needle will be inserted through your skin into the epidural space. You may feel some discomfort when this happens. An X-ray machine will be used to make sure the needle is put as close as possible to the affected nerve.  A steroid medicine and a local anesthetic will be injected into the epidural space.  The needle will be removed.  A bandage (dressing) will be put over the injection site. What happens after the procedure?  Your blood pressure, heart rate, breathing rate, and blood oxygen level will be monitored until the medicines you were given have worn off.  Your arm or leg may feel weak or numb for a few hours.  The injection site may feel sore.  Do not drive for 24 hours if you received a sedative. This information is not intended to replace advice given to you by your health care provider. Make sure you discuss any questions you have with your health care provider. Document Released: 04/27/2007 Document Revised: 07/02/2015 Document Reviewed: 05/06/2015 [employer] Interactive Patient Education  2017 [employer]

## 2016-12-14 ENCOUNTER — Encounter: Payer: Self-pay | Admitting: Pain Medicine

## 2016-12-14 ENCOUNTER — Ambulatory Visit (HOSPITAL_BASED_OUTPATIENT_CLINIC_OR_DEPARTMENT_OTHER): Payer: Medicare Other | Admitting: Pain Medicine

## 2016-12-14 ENCOUNTER — Ambulatory Visit
Admission: RE | Admit: 2016-12-14 | Discharge: 2016-12-14 | Disposition: A | Discharge status: Home / Self Care | Payer: Medicare Other | Source: Ambulatory Visit | Attending: Pain Medicine | Admitting: Pain Medicine

## 2016-12-14 ENCOUNTER — Other Ambulatory Visit: Payer: Self-pay

## 2016-12-14 VITALS — BP 146/83 | HR 47 | Temp 98.0°F | Resp 14 | Ht 64.0 in | Wt 142.0 lb

## 2016-12-14 DIAGNOSIS — M4722 Other spondylosis with radiculopathy, cervical region: Secondary | ICD-10-CM | POA: Diagnosis not present

## 2016-12-14 DIAGNOSIS — G8929 Other chronic pain: Secondary | ICD-10-CM

## 2016-12-14 DIAGNOSIS — M79602 Pain in left arm: Secondary | ICD-10-CM | POA: Insufficient documentation

## 2016-12-14 DIAGNOSIS — M4310 Spondylolisthesis, site unspecified: Secondary | ICD-10-CM

## 2016-12-14 DIAGNOSIS — G600 Hereditary motor and sensory neuropathy: Secondary | ICD-10-CM | POA: Insufficient documentation

## 2016-12-14 DIAGNOSIS — M47812 Spondylosis without myelopathy or radiculopathy, cervical region: Secondary | ICD-10-CM | POA: Diagnosis not present

## 2016-12-14 DIAGNOSIS — M542 Cervicalgia: Secondary | ICD-10-CM

## 2016-12-14 DIAGNOSIS — R001 Bradycardia, unspecified: Secondary | ICD-10-CM | POA: Insufficient documentation

## 2016-12-14 DIAGNOSIS — Z880 Allergy status to penicillin: Secondary | ICD-10-CM | POA: Insufficient documentation

## 2016-12-14 MED ORDER — GLYCOPYRROLATE 0.2 MG/ML IJ SOLN
INTRAMUSCULAR | Status: AC
  Filled 2016-12-14: qty 1

## 2016-12-14 MED ORDER — MIDAZOLAM HCL 5 MG/5ML IJ SOLN
1.0000 mg | INTRAMUSCULAR | Status: DC | PRN
  Administered 2016-12-14: 1 mg via INTRAVENOUS
  Filled 2016-12-14: qty 5

## 2016-12-14 MED ORDER — ROPIVACAINE HCL 2 MG/ML IJ SOLN
1.0000 mL | Freq: Once | INTRAMUSCULAR | Status: AC
  Administered 2016-12-14: 1 mL via EPIDURAL
  Filled 2016-12-14: qty 10

## 2016-12-14 MED ORDER — LIDOCAINE HCL 2 % IJ SOLN
10.0000 mL | Freq: Once | INTRAMUSCULAR | Status: AC
  Administered 2016-12-14: 400 mg
  Filled 2016-12-14: qty 100

## 2016-12-14 MED ORDER — GLYCOPYRROLATE 0.2 MG/ML IJ SOLN
0.1000 mg | Freq: Once | INTRAMUSCULAR | Status: AC
  Administered 2016-12-14: 0.2 mg via INTRAVENOUS

## 2016-12-14 MED ORDER — DEXAMETHASONE SODIUM PHOSPHATE 10 MG/ML IJ SOLN
10.0000 mg | Freq: Once | INTRAMUSCULAR | Status: AC
  Administered 2016-12-14: 10 mg
  Filled 2016-12-14: qty 1

## 2016-12-14 MED ORDER — IOPAMIDOL (ISOVUE-M 200) INJECTION 41%
10.0000 mL | Freq: Once | INTRAMUSCULAR | Status: AC
  Administered 2016-12-14: 10 mL via EPIDURAL
  Filled 2016-12-14: qty 10

## 2016-12-14 MED ORDER — LACTATED RINGERS IV SOLN
1000.0000 mL | Freq: Once | INTRAVENOUS | Status: AC
  Administered 2016-12-14: 1000 mL via INTRAVENOUS

## 2016-12-14 MED ORDER — SODIUM CHLORIDE 0.9% FLUSH
1.0000 mL | Freq: Once | INTRAVENOUS | Status: AC
  Administered 2016-12-14: 1 mL

## 2016-12-14 MED ORDER — FENTANYL CITRATE (PF) 100 MCG/2ML IJ SOLN
25.0000 ug | INTRAMUSCULAR | Status: DC | PRN
  Administered 2016-12-14: 50 ug via INTRAVENOUS
  Filled 2016-12-14: qty 2

## 2016-12-14 NOTE — Patient Instructions (Signed)

## 2016-12-14 NOTE — Progress Notes (Signed)
Patient's Name: Maria Wheeler  MRN: [id number]  Referring Provider: Titus Mould*  DOB: May 07, 1955  PCP: Titus Mould, NP  DOS: 12/14/2016  Note by: Oswaldo Done, MD  Service setting: Ambulatory outpatient  Specialty: Interventional Pain Management  Patient type: Established  Location: [facility] (AMB) Pain Management Facility  Visit type: Interventional Procedure   Primary Reason for Visit: Interventional Pain Management Treatment. CC: Neck Pain (left)  Procedure:  Anesthesia, Analgesia, Anxiolysis:  Type: Diagnostic, Inter-Laminar, Epidural Steroid Injection Region: Posterior Cervico-thoracic Region Level: C7-T1 Laterality: Left-Sided Paramedial  Type: Local Anesthesia with Moderate (Conscious) Sedation Local Anesthetic: Lidocaine 1% Route: Intravenous (IV) IV Access: Secured Sedation: Meaningful verbal contact was maintained at all times during the procedure  Indication(s): Analgesia and Anxiety   Indications: 1. Chronic upper extremity pain (Left)   2. Chronic neck pain (Tertiary Area of Pain) (Posterior) (Bilateral) (L>R)   3. Cervical spondylosis   4. Cervical Grade 1 Retrolisthesis of C5 over C6   5. CMT (Charcot-Marie-Tooth disease)   6. Chronic pain of left upper extremity   7. Chronic neck pain   8. Osteoarthritis of spine with radiculopathy, cervical region   9. Retrolisthesis   10. Bradycardia by electrocardiogram    Pain Score: Pre-procedure: 5 /10 Post-procedure: 0-No pain/10  Pre-op Assessment:  Maria Wheeler is a 61 y.o. (year old), female patient, seen today for interventional treatment. She  has a past surgical history that includes Abdominal hysterectomy. Maria Wheeler has a current medication list which includes the following prescription(s): atenolol, calcium plus d3 absorbable, cholecalciferol, ergocalciferol, ibuprofen, lisinopril, and oxycodone, and the following Facility-Administered Medications: fentanyl and midazolam. Her primarily  concern today is the Neck Pain (left)  Initial Vital Signs: Blood pressure 139/73, pulse (!) 47, temperature 98 F (36.7 C), temperature source Oral, resp. rate 16, height 5\' 4"  (1.626 m), weight 142 lb (64.4 kg), SpO2 97 %. BMI: Estimated body mass index is 24.37 kg/m as calculated from the following:   Height as of this encounter: 5\' 4"  (1.626 m).   Weight as of this encounter: 142 lb (64.4 kg).  Risk Assessment: Allergies: Reviewed. She is allergic to penicillins.  Allergy Precautions: None required Coagulopathies: Reviewed. None identified.  Blood-thinner therapy: None at this time Active Infection(s): Reviewed. None identified. Maria Wheeler is afebrile  Site Confirmation: Maria Wheeler was asked to confirm the procedure and laterality before marking the site Procedure checklist: Completed Consent: Before the procedure and under the influence of no sedative(s), amnesic(s), or anxiolytics, the patient was informed of the treatment options, risks and possible complications. To fulfill our ethical and legal obligations, as recommended by the American Medical Association's Code of Ethics, I have informed the patient of my clinical impression; the nature and purpose of the treatment or procedure; the risks, benefits, and possible complications of the intervention; the alternatives, including doing nothing; the risk(s) and benefit(s) of the alternative treatment(s) or procedure(s); and the risk(s) and benefit(s) of doing nothing. The patient was provided information about the general risks and possible complications associated with the procedure. These may include, but are not limited to: failure to achieve desired goals, infection, bleeding, organ or nerve damage, allergic reactions, paralysis, and death. In addition, the patient was informed of those risks and complications associated to Spine-related procedures, such as failure to decrease pain; infection (i.e.: Meningitis, epidural or intraspinal  abscess); bleeding (i.e.: epidural hematoma, subarachnoid hemorrhage, or any other type of intraspinal or peri-dural bleeding); organ or nerve damage (i.e.: Any type of  peripheral nerve, nerve root, or spinal cord injury) with subsequent damage to sensory, motor, and/or autonomic systems, resulting in permanent pain, numbness, and/or weakness of one or several areas of the body; allergic reactions; (i.e.: anaphylactic reaction); and/or death. Furthermore, the patient was informed of those risks and complications associated with the medications. These include, but are not limited to: allergic reactions (i.e.: anaphylactic or anaphylactoid reaction(s)); adrenal axis suppression; blood sugar elevation that in diabetics may result in ketoacidosis or comma; water retention that in patients with history of congestive heart failure may result in shortness of breath, pulmonary edema, and decompensation with resultant heart failure; weight gain; swelling or edema; medication-induced neural toxicity; particulate matter embolism and blood vessel occlusion with resultant organ, and/or nervous system infarction; and/or aseptic necrosis of one or more joints. Finally, the patient was informed that Medicine is not an exact science; therefore, there is also the possibility of unforeseen or unpredictable risks and/or possible complications that may result in a catastrophic outcome. The patient indicated having understood very clearly. We have given the patient no guarantees and we have made no promises. Enough time was given to the patient to ask questions, all of which were answered to the patient's satisfaction. Maria Wheeler has indicated that she wanted to continue with the procedure. Attestation: I, the ordering provider, attest that I have discussed with the patient the benefits, risks, side-effects, alternatives, likelihood of achieving goals, and potential problems during recovery for the procedure that I have provided  informed consent. Date: 12/14/2016; Time: 10:01 AM  Pre-Procedure Preparation:  Monitoring: As per clinic protocol. Respiration, ETCO2, SpO2, BP, heart rate and rhythm monitor placed and checked for adequate function Safety Precautions: Patient was assessed for positional comfort and pressure points before starting the procedure. Time-out: I initiated and conducted the "Time-out" before starting the procedure, as per protocol. The patient was asked to participate by confirming the accuracy of the "Time Out" information. Verification of the correct person, site, and procedure were performed and confirmed by me, the nursing staff, and the patient. "Time-out" conducted as per Joint Commission's Universal Protocol (UP.01.01.01). "Time-out" Date & Time: 12/14/2016; 1039 hrs.  Description of Procedure Process:   Position: Prone with head of the table was raised to facilitate breathing. Target Area: For Epidural Steroid injections the target is the interlaminar space, initially targeting the lower border of the superior vertebral body lamina. Approach: Paramedial approach. Area Prepped: Entire PosteriorCervical Region Prepping solution: ChloraPrep (2% chlorhexidine gluconate and 70% isopropyl alcohol) Safety Precautions: Aspiration looking for blood return was conducted prior to all injections. At no point did we inject any substances, as a needle was being advanced. No attempts were made at seeking any paresthesias. Safe injection practices and needle disposal techniques used. Medications properly checked for expiration dates. SDV (single dose vial) medications used. Description of the Procedure: Protocol guidelines were followed. The procedure needle was introduced through the skin, ipsilateral to the reported pain, and advanced to the target area. Bone was contacted and the needle walked caudad, until the lamina was cleared. The epidural space was identified using "loss-of-resistance technique" with 2-3  ml of PF-NaCl (0.9% NSS), in a 5cc LOR glass syringe. Vitals:   12/14/16 1045 12/14/16 1055 12/14/16 1105 12/14/16 1115  BP: 102/72 120/67 123/76 (!) 146/83  Pulse:      Resp: 14 10 14 14   Temp:  (!) 97.3 F (36.3 C)  98 F (36.7 C)  TempSrc:  Temporal  Temporal  SpO2: 97% 98% 96% 96%  Weight:      Height:        Start Time: 1039 hrs. End Time: 1044 hrs. Materials:  Needle(s) Type: Epidural needle Gauge: 17G Length: 3.5-in Medication(s): We administered lactated ringers, midazolam, fentaNYL, iopamidol, dexamethasone, ropivacaine (PF) 2 mg/mL (0.2%), sodium chloride flush, lidocaine, and glycopyrrolate. Please see chart orders for dosing details.  Imaging Guidance (Spinal):  Type of Imaging Technique: Fluoroscopy Guidance (Spinal) Indication(s): Assistance in needle guidance and placement for procedures requiring needle placement in or near specific anatomical locations not easily accessible without such assistance. Exposure Time: Please see nurses notes. Contrast: Before injecting any contrast, we confirmed that the patient did not have an allergy to iodine, shellfish, or radiological contrast. Once satisfactory needle placement was completed at the desired level, radiological contrast was injected. Contrast injected under live fluoroscopy. No contrast complications. See chart for type and volume of contrast used. Fluoroscopic Guidance: I was personally present during the use of fluoroscopy. "Tunnel Vision Technique" used to obtain the best possible view of the target area. Parallax error corrected before commencing the procedure. "Direction-depth-direction" technique used to introduce the needle under continuous pulsed fluoroscopy. Once target was reached, antero-posterior, oblique, and lateral fluoroscopic projection used confirm needle placement in all planes. Images permanently stored in EMR. Interpretation: I personally interpreted the imaging intraoperatively. Adequate needle  placement confirmed in multiple planes. Appropriate spread of contrast into desired area was observed. No evidence of afferent or efferent intravascular uptake. No intrathecal or subarachnoid spread observed. Permanent images saved into the patient's record.  Antibiotic Prophylaxis:  Indication(s): None identified Antibiotic given: None  Post-operative Assessment:  EBL: None Complications: No immediate post-treatment complications observed by team, or reported by patient. Note: The patient tolerated the entire procedure well. A repeat set of vitals were taken after the procedure and the patient was kept under observation following institutional policy, for this type of procedure. Post-procedural neurological assessment was performed, showing return to baseline, prior to discharge. The patient was provided with post-procedure discharge instructions, including a section on how to identify potential problems. Should any problems arise concerning this procedure, the patient was given instructions to immediately contact us, at any time, without hesitation. In any case, we plan to contact the patient by telephone for a follow-up status report regarding this interventional procedure. Comments:  No additional relevant information.  Plan of Care   Imaging Orders     DG C-Arm 1-60 Min-No Report  Procedure Orders     Cervical Epidural Injection  Medications ordered for procedure: Meds ordered this encounter  Medications  . lactated ringers infusion 1,000 mL  . midazolam (VERSED) 5 MG/5ML injection 1-2 mg    Make sure Flumazenil is available in the pyxis when using this medication. If oversedation occurs, administer 0.2 mg IV over 15 sec. If after 45 sec no response, administer 0.2 mg again over 1 min; may repeat at 1 min intervals; not to exceed 4 doses (1 mg)  . fentaNYL (SUBLIMAZE) injection 25-50 mcg    Make sure Narcan is available in the pyxis when using this medication. In the event of  respiratory depression (RR< 8/min): Titrate NARCAN (naloxone) in increments of 0.1 to 0.2 mg IV at 2-3 minute intervals, until desired degree of reversal.  . iopamidol (ISOVUE-M) 41 % intrathecal injection 10 mL  . dexamethasone (DECADRON) injection 10 mg  . ropivacaine (PF) 2 mg/mL (0.2%) (NAROPIN) injection 1 mL  . sodium chloride flush (NS) 0.9 % injection 1 mL  . lidocaine (XYLOCAINE) 2 % (  with pres) injection 200 mg  . glycopyrrolate (ROBINUL) injection 0.1 mg   Medications administered: We administered lactated ringers, midazolam, fentaNYL, iopamidol, dexamethasone, ropivacaine (PF) 2 mg/mL (0.2%), sodium chloride flush, lidocaine, and glycopyrrolate.  See the medical record for exact dosing, route, and time of administration.  This SmartLink is deprecated. Use AVSMEDLIST instead to display the medication list for a patient. Disposition: Discharge home  Discharge Date & Time: 12/14/2016; 1126 hrs.   Physician-requested Follow-up: Return for post-procedure eval by Dr. Laban EmperorNaveira in 2 wks. Future Appointments  Date Time Provider Department Center  12/29/2016 10:15 AM Delano MetzNaveira, Gaetana Kawahara, [facility] None   Primary Care Physician: Titus MouldWhite, Elizabeth Burney, NP [facility] Note by: Oswaldo DoneFrancisco A Asti Mackley, MD Date: 12/14/2016; Time: 1:21 PM  Disclaimer:  Medicine is not an Visual merchandiserexact science. The only guarantee in medicine is that nothing is guaranteed. It is important to note that the decision to proceed with this intervention was based on the information collected from the patient. The Data and conclusions were drawn from the patient's questionnaire, the interview, and the physical examination. Because the information was provided in large part by the patient, it cannot be guaranteed that it has not been purposely or unconsciously manipulated. Every effort has been made to obtain as much relevant data as possible for this evaluation. It is important to note  that the conclusions that lead to this procedure are derived in large part from the available data. Always take into account that the treatment will also be dependent on availability of resources and existing treatment guidelines, considered by other Pain Management Practitioners as being common knowledge and practice, at the time of the intervention. For Medico-Legal purposes, it is also important to point out that variation in procedural techniques and pharmacological choices are the acceptable norm. The indications, contraindications, technique, and results of the above procedure should only be interpreted and judged by a Board-Certified Interventional Pain Specialist with extensive familiarity and expertise in the same exact procedure and technique.

## 2016-12-14 NOTE — Progress Notes (Signed)
Safety precautions to be maintained throughout the outpatient stay will include: orient to surroundings, keep bed in low position, maintain call bell within reach at all times, provide assistance with transfer out of bed and ambulation.  

## 2016-12-15 ENCOUNTER — Telehealth: Payer: Self-pay | Admitting: *Deleted

## 2016-12-15 NOTE — Telephone Encounter (Signed)
No problems post procedure. 

## 2016-12-28 DIAGNOSIS — M47816 Spondylosis without myelopathy or radiculopathy, lumbar region: Secondary | ICD-10-CM | POA: Insufficient documentation

## 2016-12-28 NOTE — Progress Notes (Signed)
Patient's Name: Charleen Madera  MRN: [id number]  Referring Provider: Ricardo Jericho*  DOB: 1955/03/29  PCP: Ricardo Jericho, NP  DOS: 12/29/2016  Note by: Gaspar Cola, MD  Service setting: Ambulatory outpatient  Specialty: Interventional Pain Management  Location: [facility] (AMB) Pain Management Facility    Patient type: Established   Primary Reason(s) for Visit: Encounter for prescription drug management & post-procedure evaluation of chronic illness with mild to moderate exacerbation(Level of risk: moderate) CC: Neck Pain (mid)  HPI  Ms. Reddick is a 61 y.o. year old, female patient, who comes today for a post-procedure evaluation and medication management. She has HTN (hypertension), benign; Auditory hallucinations; History of drug dependence ([place]); Benzodiazepine dependence ([place]); Chronic anxiety; Diastolic dysfunction; Multiple falls; UTI (urinary tract infection); Hypokalemia; Depression with anxiety; Benzodiazepine abuse ([place]); Chronic musculoskeletal pain; CMT (Charcot-Marie-Tooth disease); H/O: stroke; SDH (subdural hematoma) ([place]); Disorder of skeletal system; Pharmacologic therapy; Problems influencing health status; Chronic neck pain (Tertiary Area of Pain) (Posterior) (Bilateral) (L>R); DDD (degenerative disc disease), cervical; Cervical spondylosis; Chronic shoulder pain (Right); Chronic upper extremity pain (Left); Chronic pain syndrome; Chronic knee pain (B) (L>R); Chronic lumbar radiculopathy (L5/S1) (Left); Chronic lower extremity pain (Primary Area of Pain) (Bilateral) (L>R); Cervical Grade 1 Retrolisthesis of C5 over C6; Abnormal nerve conduction studies; Chronic upper extremity polyneuropathy (severe) (CMT); 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[place]); Adjustment disorder with mixed anxiety and depressed mood; Chronic upper extremity pain (Secondary Area of Pain) (Bilateral) (L>R); DDD (degenerative disc disease), lumbar; Lumbar facet arthropathy (Bilateral); 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 Her primarily concern today is the Neck Pain (mid)  Pain Assessment: Location: Mid Neck Radiating: not radiating today, hands not swelling like before Onset: More than a month ago Duration: Chronic pain Quality: Throbbing, Aching, Discomfort Severity: 3 /10 (self-reported pain score)  Note: Reported level is compatible with observation.                         When using our objective Pain Scale, levels between 6 and 10/10 are said to belong in an emergency room, as it progressively worsens from a 6/10, described as severely limiting, requiring emergency care not usually available at an outpatient pain management facility. At a 6/10 level, communication becomes difficult and requires great effort. Assistance to reach the emergency department may be required. Facial flushing and profuse sweating along with potentially dangerous increases in heart rate and blood pressure will be evident. Effect on ADL: looki down, hard to turn neck, slean Timing: Constant Modifying factors: procedures, heat  Ms. Demarco was last seen on 12/14/2016 for a procedure. During today's appointment we reviewed Ms. Ferrara's post-procedure results, as well as her outpatient medication regimen.  Further details on both, my assessment(s), as well as the proposed treatment plan, please see below.  Controlled Substance Pharmacotherapy Assessment REMS (Risk Evaluation and Mitigation Strategy)  Analgesic: Oxycodone IR 5 mg 1 tablet by mouth every 6 hours when necessary (20 mg/day of oxycodone) MME/day: 30 mg/day.  Ignatius Specking, RN  12/29/2016 10:20 AM  Sign at close  encounter Nursing Pain Medication Assessment:  Safety precautions to be maintained throughout the outpatient stay will include: orient to surroundings, keep bed in low position, maintain call bell within reach  at all times, provide assistance with transfer out of bed and ambulation.  Medication Inspection Compliance: Pill count conducted under aseptic conditions, in front of the patient. Neither the pills nor the bottle was removed from the patient's sight at any time. Once count was completed pills were immediately returned to the patient in their original bottle.  Medication: See above Pill/Patch Count: 2 of 120 pills remain Pill/Patch Appearance: Markings consistent with prescribed medication Bottle Appearance: Standard pharmacy container. Clearly labeled. Filled Date: 48 / 31 / 2018 Last Medication intake:  Today   Pharmacokinetics: Liberation and absorption (onset of action): WNL Distribution (time to peak effect): WNL Metabolism and excretion (duration of action): WNL         Pharmacodynamics: Desired effects: Analgesia: Ms. Marks reports >50% benefit. Functional ability: Patient reports that medication allows her to accomplish basic ADLs Clinically meaningful improvement in function (CMIF): Sustained CMIF goals met Perceived effectiveness: Described as relatively effective, allowing for increase in activities of daily living (ADL) Undesirable effects: Side-effects or Adverse reactions: None reported Monitoring: [place] PMP: Online review of the past 53-monthperiod conducted. Compliant with practice rules and regulations Last UDS on record: Summary  Date Value Ref Range Status  11/10/2016 FINAL  Final    Comment:    ==================================================================== [facility] COMP DRUG ANALYSIS,UR ==================================================================== Test                             Result       Flag       Units Drug Present and Declared for  Prescription Verification   Oxycodone                      802          EXPECTED   ng/mg creat   Oxymorphone                    350          EXPECTED   ng/mg creat   Noroxycodone                   578          EXPECTED   ng/mg creat   Noroxymorphone                 93           EXPECTED   ng/mg creat    Sources of oxycodone are scheduled prescription medications.    Oxymorphone, noroxycodone, and noroxymorphone are expected    metabolites of oxycodone. Oxymorphone is also available as a    scheduled prescription medication.   Acetaminophen                  PRESENT      EXPECTED   Atenolol                       PRESENT      EXPECTED Drug Present not Declared for Prescription Verification   Lorazepam                      953          UNEXPECTED ng/mg creat    Source of lorazepam is a scheduled prescription medication.   Trazodone                      PRESENT  UNEXPECTED   Quetiapine                     PRESENT      UNEXPECTED   Ibuprofen                      PRESENT      UNEXPECTED ==================================================================== Test                      Result    Flag   Units      Ref Range   Creatinine              171              mg/dL      >=20 ==================================================================== Declared Medications:  The flagging and interpretation on this report are based on the  following declared medications.  Unexpected results may arise from  inaccuracies in the declared medications.  **Note: The testing scope of this panel includes these medications:  Atenolol  Oxycodone (Oxycodone Acetaminophen)  **Note: The testing scope of this panel does not include small to  moderate amounts of these reported medications:  Acetaminophen (Oxycodone Acetaminophen)  **Note: The testing scope of this panel does not include following  reported medications:  Lisinopril ==================================================================== For  clinical consultation, please call [phone]. ====================================================================    UDS interpretation: Unexpected findings: Patient reminded of the CDC guidelines recommending to stay away from the sedatives & benzodiazepines due to the risk of respiratory depression and death. Medication Assessment Form: Reviewed. Patient indicates being compliant with therapy Treatment compliance: Compliant Risk Assessment Profile: Aberrant behavior: See prior evaluations. None observed or detected today Comorbid factors increasing risk of overdose: See prior notes. No additional risks detected today Risk of substance use disorder (SUD): Low Opioid Risk Tool - 12/29/16 1025      Family History of Substance Abuse   Alcohol  Positive Female    Illegal Drugs  Negative    Rx Drugs  Negative      Personal History of Substance Abuse   Alcohol  Negative    Illegal Drugs  Negative    Rx Drugs  Negative      History of Preadolescent Sexual Abuse   History of Preadolescent Sexual Abuse  Negative or Female      Psychological Disease   Psychological Disease  Negative    Depression  Negative      Total Score   Opioid Risk Tool Scoring  1    Opioid Risk Interpretation  Low Risk      ORT Scoring interpretation table:  Score <3 = Low Risk for SUD  Score between 4-7 = Moderate Risk for SUD  Score >8 = High Risk for Opioid Abuse   Risk Mitigation Strategies:  Patient Counseling: Covered Patient-Prescriber Agreement (PPA): Present and active  Notification to other healthcare providers: Done  Pharmacologic Plan: No change in therapy, at this time  Post-Procedure Assessment  12/14/2016 Procedure: Diagnostic left sided cervical epidural steroid injection under fluoroscopic guidance and IV sedation Pre-procedure pain score:  5/10 Post-procedure pain score: 0/10 (100% relief) Influential Factors: BMI: 24.03 kg/m Intra-procedural challenges: None observed.          Assessment challenges: None detected.              Reported side-effects: None.        Post-procedural adverse reactions or complications: None reported  Sedation: Sedation provided. When no sedatives are used, the analgesic levels obtained are directly associated to the effectiveness of the local anesthetics. However, when sedation is provided, the level of analgesia obtained during the initial 1 hour following the intervention, is believed to be the result of a combination of factors. These factors may include, but are not limited to: 1. The effectiveness of the local anesthetics used. 2. The effects of the analgesic(s) and/or anxiolytic(s) used. 3. The degree of discomfort experienced by the patient at the time of the procedure. 4. The patients ability and reliability in recalling and recording the events. 5. The presence and influence of possible secondary gains and/or psychosocial factors. Reported result: Relief experienced during the 1st hour after the procedure: 100 % (Ultra-Short Term Relief)            Interpretative annotation: Clinically appropriate result. Analgesia during this period is likely to be Local Anesthetic and/or IV Sedative (Analgesic/Anxiolytic) related.          Effects of local anesthetic: The analgesic effects attained during this period are directly associated to the localized infiltration of local anesthetics and therefore cary significant diagnostic value as to the etiological location, or anatomical origin, of the pain. Expected duration of relief is directly dependent on the pharmacodynamics of the local anesthetic used. Long-acting (4-6 hours) anesthetics used.  Reported result: Relief during the next 4 to 6 hour after the procedure: 100 % (Short-Term Relief)            Interpretative annotation: Clinically appropriate result. Analgesia during this period is likely to be Local Anesthetic-related.          Long-term benefit: Defined as the period of time  past the expected duration of local anesthetics (1 hour for short-acting and 4-6 hours for long-acting). With the possible exception of prolonged sympathetic blockade from the local anesthetics, benefits during this period are typically attributed to, or associated with, other factors such as analgesic sensory neuropraxia, antiinflammatory effects, or beneficial biochemical changes provided by agents other than the local anesthetics.  Reported result: Extended relief following procedure: 75 % (Long-Term Relief)            Interpretative annotation: Clinically appropriate result. Good relief. No permanent benefit expected. Inflammation plays a part in the etiology to the pain.          Current benefits: Defined as reported results that persistent at this point in time.   Analgesia: >75 %            Function: Somewhat improved ROM: Somewhat improved Interpretative annotation: Recurrence of symptoms. No permanent benefit expected. Effective diagnostic intervention.          Interpretation: Results would suggest a successful diagnostic intervention.                  Plan:  Set up procedure as a PRN palliative treatment option for this patient.        Laboratory Chemistry  Inflammation Markers (CRP: Acute Phase) (ESR: Chronic Phase) Lab Results  Component Value Date   CRP 1.2 11/10/2016   ESRSEDRATE 5 11/10/2016                 Rheumatology Markers No results found for: RF, ANA, [facility], LYMEIGGIGMAB, [facility]              Renal Function Markers Lab Results  Component Value Date   BUN 18 11/10/2016   CREATININE 0.87 11/10/2016   GFRAA 83 11/10/2016  GFRNONAA 72 11/10/2016                 Hepatic Function Markers Lab Results  Component Value Date   AST 11 11/10/2016   ALT 13 (L) 12/31/2015   ALBUMIN 4.2 11/10/2016   ALKPHOS 81 11/10/2016                 Electrolytes Lab Results  Component Value Date   NA 141 11/10/2016   K 3.8 11/10/2016   CL 100 11/10/2016    CALCIUM 9.3 11/10/2016   MG 2.0 11/10/2016                 Neuropathy Markers Lab Results  Component Value Date   VITAMINB12 615 11/10/2016                 Bone Pathology Markers Lab Results  Component Value Date   VD25OH 13.5 (L) 12/31/2015   25OHVITD1 13 (L) 11/10/2016   25OHVITD2 <1.0 11/10/2016   25OHVITD3 13 11/10/2016                 Coagulation Parameters Lab Results  Component Value Date   INR 1.0 12/30/2013   LABPROT 13.2 12/30/2013   APTT 28.9 12/30/2013   PLT 214 12/31/2015                 Cardiovascular Markers Lab Results  Component Value Date   CKTOTAL 36 (L) 12/31/2015   TROPONINI <0.03 12/31/2015   HGB 13.5 12/31/2015   HCT 39.3 12/31/2015                 CA Markers No results found for: CEA, CA125, LABCA2               Note: Lab results reviewed.  Recent Diagnostic Imaging Results  DG C-Arm 1-60 Min-No Report Fluoroscopy was utilized by the requesting physician.  No radiographic  interpretation.   Complexity Note: Imaging results reviewed. Results shared with Ms. Burel, using Layman's terms.                         Meds   Current Outpatient Medications:  .  atenolol (TENORMIN) 100 MG tablet, Take by mouth daily. , Disp: , Rfl:  .  Calcium Carb-Cholecalciferol (CALCIUM PLUS D3 ABSORBABLE) [phone] MG-UNIT CAPS, Take 1 capsule by mouth daily with breakfast., Disp: 90 capsule, Rfl: 0 .  Cholecalciferol 5000 units capsule, Take 1 capsule (5,000 Units total) by mouth daily., Disp: 90 capsule, Rfl: 0 .  ibuprofen (ADVIL,MOTRIN) 200 MG tablet, Take 200 mg by mouth every 6 (six) hours as needed., Disp: , Rfl:  .  lisinopril (PRINIVIL,ZESTRIL) 10 MG tablet, Take 10 mg by mouth. , Disp: , Rfl:  .  [START ON 12/31/2016] oxyCODONE (OXY IR/ROXICODONE) 5 MG immediate release tablet, Take 1 tablet (5 mg total) by mouth every 6 (six) hours as needed for severe pain., Disp: 120 tablet, Rfl: 0  ROS  Constitutional: Denies any fever or  chills Gastrointestinal: No reported hemesis, hematochezia, vomiting, or acute GI distress Musculoskeletal: Denies any acute onset joint swelling, redness, loss of ROM, or weakness Neurological: No reported episodes of acute onset apraxia, aphasia, dysarthria, agnosia, amnesia, paralysis, loss of coordination, or loss of consciousness  Allergies  Ms. Simko is allergic to penicillins.  [place]  Drug: Ms. Bluett  reports that she does not use drugs. Alcohol:  reports that she drinks about 0.6 oz of alcohol per week. Tobacco:  reports that  has never smoked. she has never used smokeless tobacco. Medical:  has a past medical history of Abdominal pain (12/22/2012), Abnormal EKG (12/11/2011), Altered mental status (12/11/2011), Anxiety, Benzodiazepine dependence ([place]) (12/14/2011), Charcot-Marie disease, Chronic anxiety (12/14/2011), Dehydration (33/03/9516), Diastolic dysfunction (84/16/6063), Drug withdrawal ([place]) (12/12/2011), Hypertension, Hypokalemia (12/11/2011), and Psychosis ([place]) (12/12/2011). Surgical: Ms. Koral  has a past surgical history that includes Abdominal hysterectomy. Family: family history includes Charcot-Marie-Tooth disease in her mother and son; Heart disease in her father.  Constitutional Exam  General appearance: Well nourished, well developed, and well hydrated. In no apparent acute distress Vitals:   12/29/16 1018  Pulse: (!) 55  Resp: 16  Temp: 97.6 F (36.4 C)  SpO2: 100%  Weight: 140 lb (63.5 kg)  Height: '5\' 4"'  (1.626 m)   BMI Assessment: Estimated body mass index is 24.03 kg/m as calculated from the following:   Height as of this encounter: '5\' 4"'  (1.626 m).   Weight as of this encounter: 140 lb (63.5 kg).  BMI interpretation table: BMI level Category Range association with higher incidence of chronic pain  <18 kg/m2 Underweight   18.5-24.9 kg/m2 Ideal body weight   25-29.9 kg/m2 Overweight Increased incidence by 20%  30-34.9 kg/m2 Obese (Class I) Increased  incidence by 68%  35-39.9 kg/m2 Severe obesity (Class II) Increased incidence by 136%  >40 kg/m2 Extreme obesity (Class III) Increased incidence by 254%   BMI Readings from Last 4 Encounters:  12/29/16 24.03 kg/m  12/14/16 24.37 kg/m  12/01/16 24.37 kg/m  11/10/16 25.40 kg/m   Wt Readings from Last 4 Encounters:  12/29/16 140 lb (63.5 kg)  12/14/16 142 lb (64.4 kg)  12/01/16 142 lb (64.4 kg)  11/10/16 148 lb (67.1 kg)  Psych/Mental status: Alert, oriented x 3 (person, place, & time)       Eyes: PERLA Respiratory: No evidence of acute respiratory distress  Cervical Spine Area Exam  Skin & Axial Inspection: No masses, redness, edema, swelling, or associated skin lesions Alignment: Symmetrical Functional ROM: Improved after treatment      Stability: No instability detected Muscle Tone/Strength: Functionally intact. No obvious neuro-muscular anomalies detected. Sensory (Neurological): Unimpaired Palpation: No palpable anomalies              Upper Extremity (UE) Exam    Side: Right upper extremity  Side: Left upper extremity  Skin & Extremity Inspection: Skin color, temperature, and hair growth are WNL. No peripheral edema or cyanosis. No masses, redness, swelling, asymmetry, or associated skin lesions. No contractures.  Skin & Extremity Inspection: Skin color, temperature, and hair growth are WNL. No peripheral edema or cyanosis. No masses, redness, swelling, asymmetry, or associated skin lesions. No contractures.  Functional ROM: Unrestricted ROM          Functional ROM: Unrestricted ROM          Muscle Tone/Strength: Functionally intact. No obvious neuro-muscular anomalies detected.  Muscle Tone/Strength: Functionally intact. No obvious neuro-muscular anomalies detected.  Sensory (Neurological): Unimpaired          Sensory (Neurological): Unimpaired          Palpation: No palpable anomalies              Palpation: No palpable anomalies              Specialized Test(s): Deferred          Specialized Test(s): Deferred          Thoracic Spine Area Exam  Skin & Axial Inspection:  No masses, redness, or swelling Alignment: Symmetrical Functional ROM: Unrestricted ROM Stability: No instability detected Muscle Tone/Strength: Functionally intact. No obvious neuro-muscular anomalies detected. Sensory (Neurological): Unimpaired Muscle strength & Tone: No palpable anomalies  Lumbar Spine Area Exam  Skin & Axial Inspection: No masses, redness, or swelling Alignment: Symmetrical Functional ROM: Unrestricted ROM      Stability: No instability detected Muscle Tone/Strength: Functionally intact. No obvious neuro-muscular anomalies detected. Sensory (Neurological): Unimpaired Palpation: No palpable anomalies       Provocative Tests: Lumbar Hyperextension and rotation test: evaluation deferred today       Lumbar Lateral bending test: evaluation deferred today       Patrick's Maneuver: evaluation deferred today                    Gait & Posture Assessment  Ambulation: Unassisted Gait: Relatively normal for age and body habitus Posture: WNL   Lower Extremity Exam    Side: Right lower extremity  Side: Left lower extremity  Skin & Extremity Inspection: Skin color, temperature, and hair growth are WNL. No peripheral edema or cyanosis. No masses, redness, swelling, asymmetry, or associated skin lesions. No contractures.  Skin & Extremity Inspection: Skin color, temperature, and hair growth are WNL. No peripheral edema or cyanosis. No masses, redness, swelling, asymmetry, or associated skin lesions. No contractures.  Functional ROM: Unrestricted ROM          Functional ROM: Unrestricted ROM          Muscle Tone/Strength: Functionally intact. No obvious neuro-muscular anomalies detected.  Muscle Tone/Strength: Functionally intact. No obvious neuro-muscular anomalies detected.  Sensory (Neurological): Unimpaired  Sensory (Neurological): Unimpaired  Palpation: No palpable anomalies   Palpation: No palpable anomalies   Assessment  Primary Diagnosis & Pertinent Problem List: The primary encounter diagnosis was Chronic pain syndrome. 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  Status Diagnosis  Controlled Improved Improved 1. Chronic pain syndrome   2. Chronic upper extremity pain (Secondary Area of Pain) (Bilateral) (L>R)   3. Chronic neck pain (Tertiary Area of Pain) (Posterior) (Bilateral) (L>R)   4. Cervical Grade 1 Retrolisthesis of C5 over C6   5. Cervical spondylosis   6. Chronic lower extremity pain (Primary Area of Pain) (Bilateral) (L>R)   7. Lumbar facet osteoarthritis (Bilateral)   8. CMT (Charcot-Marie-Tooth disease)   9. 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 Plan of Care  Pharmacotherapy (Medications Ordered): Meds ordered this encounter  Medications  . oxyCODONE (OXY IR/ROXICODONE) 5 MG immediate release tablet    Sig: Take 1 tablet (5 mg total) by mouth every 6 (six) hours as needed for severe pain.    Dispense:  120 tablet    Refill:  0    Do not place this medication, or any other prescription from our practice, on "Automatic Refill". Patient may have prescription filled one day early if pharmacy is closed on scheduled refill date. Do not fill until: 12/31/16 To last until: 01/30/17  This SmartLink is deprecated. Use AVSMEDLIST  instead to display the medication list for a patient. Medications administered today: [employer] [employer]" had no medications administered during this visit.   Procedure Orders     Cervical Epidural Injection Lab Orders  No laboratory test(s) ordered today   Imaging Orders  No imaging studies ordered today   Referral Orders  No referral(s) requested today    Interventional management options: Planned, scheduled, and/or pending:   None at this time. The patient will be coming in for medication management by [place]. Should she continue to do well on the medications and follow all the rules and regulations, we will consider expanding the period between refills from 30 days to 60, and eventually 90.    Considering:   Diagnostic right-sided L5-S1 lumbar epidural steroid injection  Diagnostic bilateral lumbar facet block  Possible bilateral lumbar facet RFA  Diagnostic left sided cervical epidural steroid injection  Diagnostic bilateral cervical facet block  Possible bilateral cervical facet RFA  Diagnostic right shoulder injection  Diagnostic right suprascapular nerve block  Possible right sided suprascapular nerve RFA    Palliative PRN treatment(s):   Diagnostic left sided cervical epidural steroid injection    Provider-requested follow-up: Return in about 1 month (around 01/28/2017) for Med-Mgmt (w/ Dionisio David, NP), PRN Procedure.  Future Appointments  Date Time Provider [place]  01/19/2017  2:30 PM Vevelyn Francois, NP [facility] None   Primary Care Physician: Ricardo Jericho, NP Location: [facility] Note by: Gaspar Cola, MD Date: 12/29/2016; Time: 3:39 PM

## 2016-12-29 ENCOUNTER — Other Ambulatory Visit: Payer: Self-pay

## 2016-12-29 ENCOUNTER — Encounter: Payer: Self-pay | Admitting: Pain Medicine

## 2016-12-29 ENCOUNTER — Ambulatory Visit: Payer: Medicare Other | Attending: Pain Medicine | Admitting: Pain Medicine

## 2016-12-29 VITALS — HR 55 | Temp 97.6°F | Resp 16 | Ht 64.0 in | Wt 140.0 lb

## 2016-12-29 DIAGNOSIS — M79604 Pain in right leg: Secondary | ICD-10-CM

## 2016-12-29 DIAGNOSIS — Z79891 Long term (current) use of opiate analgesic: Secondary | ICD-10-CM | POA: Diagnosis not present

## 2016-12-29 DIAGNOSIS — M5116 Intervertebral disc disorders with radiculopathy, lumbar region: Secondary | ICD-10-CM | POA: Diagnosis not present

## 2016-12-29 DIAGNOSIS — M79601 Pain in right arm: Secondary | ICD-10-CM

## 2016-12-29 DIAGNOSIS — G8929 Other chronic pain: Secondary | ICD-10-CM | POA: Diagnosis not present

## 2016-12-29 DIAGNOSIS — G894 Chronic pain syndrome: Secondary | ICD-10-CM

## 2016-12-29 DIAGNOSIS — Z8489 Family history of other specified conditions: Secondary | ICD-10-CM | POA: Diagnosis not present

## 2016-12-29 DIAGNOSIS — Z79899 Other long term (current) drug therapy: Secondary | ICD-10-CM | POA: Insufficient documentation

## 2016-12-29 DIAGNOSIS — R001 Bradycardia, unspecified: Secondary | ICD-10-CM | POA: Insufficient documentation

## 2016-12-29 DIAGNOSIS — Z791 Long term (current) use of non-steroidal anti-inflammatories (NSAID): Secondary | ICD-10-CM | POA: Diagnosis not present

## 2016-12-29 DIAGNOSIS — G950 Syringomyelia and syringobulbia: Secondary | ICD-10-CM | POA: Diagnosis not present

## 2016-12-29 DIAGNOSIS — M7918 Myalgia, other site: Secondary | ICD-10-CM | POA: Diagnosis not present

## 2016-12-29 DIAGNOSIS — M4722 Other spondylosis with radiculopathy, cervical region: Secondary | ICD-10-CM

## 2016-12-29 DIAGNOSIS — G608 Other hereditary and idiopathic neuropathies: Secondary | ICD-10-CM

## 2016-12-29 DIAGNOSIS — Z880 Allergy status to penicillin: Secondary | ICD-10-CM | POA: Diagnosis not present

## 2016-12-29 DIAGNOSIS — M542 Cervicalgia: Secondary | ICD-10-CM | POA: Diagnosis not present

## 2016-12-29 DIAGNOSIS — I10 Essential (primary) hypertension: Secondary | ICD-10-CM | POA: Diagnosis not present

## 2016-12-29 DIAGNOSIS — E876 Hypokalemia: Secondary | ICD-10-CM | POA: Insufficient documentation

## 2016-12-29 DIAGNOSIS — M47816 Spondylosis without myelopathy or radiculopathy, lumbar region: Secondary | ICD-10-CM | POA: Diagnosis not present

## 2016-12-29 DIAGNOSIS — Z90710 Acquired absence of both cervix and uterus: Secondary | ICD-10-CM | POA: Diagnosis not present

## 2016-12-29 DIAGNOSIS — E860 Dehydration: Secondary | ICD-10-CM | POA: Diagnosis not present

## 2016-12-29 DIAGNOSIS — Z8249 Family history of ischemic heart disease and other diseases of the circulatory system: Secondary | ICD-10-CM | POA: Diagnosis not present

## 2016-12-29 DIAGNOSIS — M79605 Pain in left leg: Secondary | ICD-10-CM

## 2016-12-29 DIAGNOSIS — G629 Polyneuropathy, unspecified: Secondary | ICD-10-CM | POA: Diagnosis not present

## 2016-12-29 DIAGNOSIS — M4310 Spondylolisthesis, site unspecified: Secondary | ICD-10-CM

## 2016-12-29 DIAGNOSIS — E559 Vitamin D deficiency, unspecified: Secondary | ICD-10-CM | POA: Diagnosis not present

## 2016-12-29 DIAGNOSIS — G600 Hereditary motor and sensory neuropathy: Secondary | ICD-10-CM

## 2016-12-29 DIAGNOSIS — M79602 Pain in left arm: Secondary | ICD-10-CM | POA: Diagnosis not present

## 2016-12-29 MED ORDER — OXYCODONE HCL 5 MG PO TABS: 5.0000 mg | ORAL_TABLET | Freq: Four times a day (QID) | ORAL | 0 refills | Status: DC | PRN

## 2016-12-29 NOTE — Progress Notes (Signed)
Nursing Pain Medication Assessment:  Safety precautions to be maintained throughout the outpatient stay will include: orient to surroundings, keep bed in low position, maintain call bell within reach at all times, provide assistance with transfer out of bed and ambulation.  Medication Inspection Compliance: Pill count conducted under aseptic conditions, in front of the patient. Neither the pills nor the bottle was removed from the patient's sight at any time. Once count was completed pills were immediately returned to the patient in their original bottle.  Medication: See above Pill/Patch Count: 2 of 120 pills remain Pill/Patch Appearance: Markings consistent with prescribed medication Bottle Appearance: Standard pharmacy container. Clearly labeled. Filled Date: 8010 / 31 / 2018 Last Medication intake:  Today

## 2016-12-29 NOTE — Patient Instructions (Signed)

## 2017-01-03 ENCOUNTER — Ambulatory Visit: Payer: Medicare Other | Admitting: Pain Medicine

## 2017-01-19 ENCOUNTER — Encounter: Payer: Self-pay | Admitting: Nurse Practitioner

## 2017-01-19 ENCOUNTER — Other Ambulatory Visit: Payer: Self-pay

## 2017-01-19 ENCOUNTER — Ambulatory Visit: Payer: Medicare Other | Attending: Nurse Practitioner | Admitting: Nurse Practitioner

## 2017-01-19 VITALS — BP 208/111 | HR 58 | Temp 98.4°F | Resp 16 | Ht 64.0 in | Wt 140.0 lb

## 2017-01-19 DIAGNOSIS — F4323 Adjustment disorder with mixed anxiety and depressed mood: Secondary | ICD-10-CM | POA: Diagnosis not present

## 2017-01-19 DIAGNOSIS — G950 Syringomyelia and syringobulbia: Secondary | ICD-10-CM | POA: Insufficient documentation

## 2017-01-19 DIAGNOSIS — M4312 Spondylolisthesis, cervical region: Secondary | ICD-10-CM | POA: Insufficient documentation

## 2017-01-19 DIAGNOSIS — E876 Hypokalemia: Secondary | ICD-10-CM | POA: Insufficient documentation

## 2017-01-19 DIAGNOSIS — M7918 Myalgia, other site: Secondary | ICD-10-CM | POA: Insufficient documentation

## 2017-01-19 DIAGNOSIS — R296 Repeated falls: Secondary | ICD-10-CM | POA: Insufficient documentation

## 2017-01-19 DIAGNOSIS — I10 Essential (primary) hypertension: Secondary | ICD-10-CM | POA: Insufficient documentation

## 2017-01-19 DIAGNOSIS — F1920 Other psychoactive substance dependence, uncomplicated: Secondary | ICD-10-CM | POA: Insufficient documentation

## 2017-01-19 DIAGNOSIS — G629 Polyneuropathy, unspecified: Secondary | ICD-10-CM | POA: Insufficient documentation

## 2017-01-19 DIAGNOSIS — M25561 Pain in right knee: Secondary | ICD-10-CM | POA: Insufficient documentation

## 2017-01-19 DIAGNOSIS — F419 Anxiety disorder, unspecified: Secondary | ICD-10-CM | POA: Insufficient documentation

## 2017-01-19 DIAGNOSIS — G894 Chronic pain syndrome: Secondary | ICD-10-CM | POA: Insufficient documentation

## 2017-01-19 DIAGNOSIS — M5116 Intervertebral disc disorders with radiculopathy, lumbar region: Secondary | ICD-10-CM | POA: Insufficient documentation

## 2017-01-19 DIAGNOSIS — F329 Major depressive disorder, single episode, unspecified: Secondary | ICD-10-CM | POA: Diagnosis not present

## 2017-01-19 DIAGNOSIS — M5030 Other cervical disc degeneration, unspecified cervical region: Secondary | ICD-10-CM | POA: Diagnosis not present

## 2017-01-19 DIAGNOSIS — Z79891 Long term (current) use of opiate analgesic: Secondary | ICD-10-CM | POA: Insufficient documentation

## 2017-01-19 DIAGNOSIS — M5416 Radiculopathy, lumbar region: Secondary | ICD-10-CM

## 2017-01-19 DIAGNOSIS — M542 Cervicalgia: Secondary | ICD-10-CM | POA: Insufficient documentation

## 2017-01-19 DIAGNOSIS — Z87440 Personal history of urinary (tract) infections: Secondary | ICD-10-CM | POA: Diagnosis not present

## 2017-01-19 DIAGNOSIS — Z79899 Other long term (current) drug therapy: Secondary | ICD-10-CM | POA: Diagnosis not present

## 2017-01-19 DIAGNOSIS — E559 Vitamin D deficiency, unspecified: Secondary | ICD-10-CM | POA: Insufficient documentation

## 2017-01-19 DIAGNOSIS — G600 Hereditary motor and sensory neuropathy: Secondary | ICD-10-CM | POA: Diagnosis not present

## 2017-01-19 DIAGNOSIS — Z8673 Personal history of transient ischemic attack (TIA), and cerebral infarction without residual deficits: Secondary | ICD-10-CM | POA: Insufficient documentation

## 2017-01-19 DIAGNOSIS — G8929 Other chronic pain: Secondary | ICD-10-CM

## 2017-01-19 DIAGNOSIS — M25562 Pain in left knee: Secondary | ICD-10-CM | POA: Diagnosis not present

## 2017-01-19 DIAGNOSIS — R440 Auditory hallucinations: Secondary | ICD-10-CM | POA: Diagnosis not present

## 2017-01-19 DIAGNOSIS — Z791 Long term (current) use of non-steroidal anti-inflammatories (NSAID): Secondary | ICD-10-CM | POA: Diagnosis not present

## 2017-01-19 MED ORDER — OXYCODONE HCL 5 MG PO TABS: 5.0000 mg | ORAL_TABLET | Freq: Four times a day (QID) | ORAL | 0 refills | Status: DC | PRN

## 2017-01-19 NOTE — Patient Instructions (Addendum)

## 2017-01-19 NOTE — Progress Notes (Signed)
Nursing Pain Medication Assessment:  Safety precautions to be maintained throughout the outpatient stay will include: orient to surroundings, keep bed in low position, maintain call bell within reach at all times, provide assistance with transfer out of bed and ambulation.  Medication Inspection Compliance: Pill count conducted under aseptic conditions, in front of the patient. Neither the pills nor the bottle was removed from the patient's sight at any time. Once count was completed pills were immediately returned to the patient in their original bottle.  Medication: Oxycodone IR Pill/Patch Count: 35 of 120 pills remain Pill/Patch Appearance: Markings consistent with prescribed medication Bottle Appearance: Standard pharmacy container. Clearly labeled. Filled Date: 4411 / 30 / 2018 Last Medication intake:  Today

## 2017-01-19 NOTE — Progress Notes (Signed)
Patient's Name: Maria Wheeler  MRN: [id number]  Referring Provider: Ricardo Jericho*  DOB: Mar 28, 1955  PCP: Ricardo Jericho, NP  DOS: 01/19/2017  Note by: Vevelyn Francois NP  Service setting: Ambulatory outpatient  Specialty: Interventional Pain Management  Location: [facility] (AMB) Pain Management Facility    Patient type: Established    Primary Reason(s) for Visit: Encounter for prescription drug management. (Level of risk: moderate)  CC: Generalized Body Aches (Char[place] Marie Tooth disease)  HPI  [name] is a 61 y.o. year old, female patient, who comes today for a medication management evaluation. She has HTN (hypertension), benign; Auditory hallucinations; History of drug dependence ([place]); Benzodiazepine dependence ([place]); Chronic anxiety; Diastolic dysfunction; Multiple falls; UTI (urinary tract infection); Hypokalemia; Depression with anxiety; Benzodiazepine abuse ([place]); Chronic musculoskeletal pain; CMT (Charcot-Marie-Tooth disease); H/O: stroke; SDH (subdural hematoma) ([place]); Disorder of skeletal system; Pharmacologic therapy; Problems influencing health status; Chronic neck pain (Tertiary Area of Pain) (Posterior) (Bilateral) (L>R); DDD (degenerative disc disease), cervical; Cervical spondylosis; Chronic shoulder pain (Right); Chronic upper extremity pain (Left); Chronic pain syndrome; Chronic knee pain (B) (L>R); Chronic lumbar radiculopathy (L5/S1) (Left); Chronic lower extremity pain (Primary Area of Pain) (Bilateral) (L>R); Cervical Grade 1 Retrolisthesis of C5 over C6; Abnormal nerve conduction studies; Chronic upper extremity polyneuropathy (severe) (CMT); 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[place]); Adjustment disorder with mixed anxiety and depressed mood; Chronic upper extremity pain (Secondary Area of Pain) (Bilateral) (L>R); DDD (degenerative disc disease), lumbar; Lumbar facet arthropathy (Bilateral); 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 Her primarily concern today is the Generalized Body Aches (Charco Marie Tooth disease)  Pain Assessment: Location: Other (Comment) Other (Comment)(generalized pain caused by [place] Tooth disease) Radiating: NA Onset: More than a month ago Duration: Chronic pain Quality: Throbbing, Aching(weakness in hands and fingers) Severity: 5 /10 (self-reported pain score)  Note: Reported level is compatible with observation. Clinically the patient looks like a 3/10 A 3/10 is viewed as "Moderate" and described as significantly interfering with activities of daily living (ADL). It becomes difficult to feed, bathe, get dressed, get on and off the toilet or to perform personal hygiene functions. Difficult to get in and out of bed or a chair without assistance. Very distracting. With effort, it can be ignored when deeply involved in activities. Information on the proper use of the pain scale provided to the patient today.  Effect on ADL: difficulty with opening pill bottles or jars.  limited ROM in left side unable to walk up onto a curb or step down.  balance is affected. Timing: Constant Modifying factors: medications take the edge off  [name] was last scheduled for an appointment on Visit date not found for medication management. During today's appointment we reviewed Ms. Colantonio's chronic pain status, as well as her outpatient medication regimen. She is concern about the dizziness. She has been falling. She is concern about her balance.   The patient  reports that she does not use drugs. Her body mass index is 24.03 kg/m.  Further details on both, my assessment(s), as well as the proposed treatment plan,  please see below.  Controlled Substance Pharmacotherapy Assessment REMS (Risk Evaluation and Mitigation Strategy)  Analgesic: Oxycodone IR 5 mg 1 tablet by mouth every 6 hours when necessary (20 mg/day  of oxycodone) MME/day: 30 mg/day.  Janett Billow, RN  01/19/2017  2:21 PM  Sign at close encounter Nursing Pain Medication Assessment:  Safety precautions to be maintained throughout the outpatient stay will include: orient to surroundings, keep bed in low position, maintain call bell within reach at all times, provide assistance with transfer out of bed and ambulation.  Medication Inspection Compliance: Pill count conducted under aseptic conditions, in front of the patient. Neither the pills nor the bottle was removed from the patient's sight at any time. Once count was completed pills were immediately returned to the patient in their original bottle.  Medication: Oxycodone IR Pill/Patch Count: 35 of 120 pills remain Pill/Patch Appearance: Markings consistent with prescribed medication Bottle Appearance: Standard pharmacy container. Clearly labeled. Filled Date: 9 / 30 / 2018 Last Medication intake:  Today   Pharmacokinetics: Liberation and absorption (onset of action): WNL Distribution (time to peak effect): WNL Metabolism and excretion (duration of action): WNL         Pharmacodynamics: Desired effects: Analgesia: Ms. Prestwood reports >50% benefit. Functional ability: Patient reports that medication allows her to accomplish basic ADLs Clinically meaningful improvement in function (CMIF): Sustained CMIF goals met Perceived effectiveness: Described as relatively effective, allowing for increase in activities of daily living (ADL) Undesirable effects: Side-effects or Adverse reactions: None reported Monitoring: [place] PMP: Online review of the past 35-monthperiod conducted. Compliant with practice rules and regulations Last UDS on record: Summary  Date Value Ref Range Status   11/10/2016 FINAL  Final    Comment:    ==================================================================== TOXASSURE COMP DRUG ANALYSIS,UR ==================================================================== Test                             Result       Flag       Units Drug Present and Declared for Prescription Verification   Oxycodone                      802          EXPECTED   ng/mg creat   Oxymorphone                    350          EXPECTED   ng/mg creat   Noroxycodone                   578          EXPECTED   ng/mg creat   Noroxymorphone                 93           EXPECTED   ng/mg creat    Sources of oxycodone are scheduled prescription medications.    Oxymorphone, noroxycodone, and noroxymorphone are expected    metabolites of oxycodone. Oxymorphone is also available as a    scheduled prescription medication.   Acetaminophen                  PRESENT      EXPECTED   Atenolol                       PRESENT      EXPECTED Drug Present not Declared for Prescription Verification   Lorazepam  953          UNEXPECTED ng/mg creat    Source of lorazepam is a scheduled prescription medication.   Trazodone                      PRESENT      UNEXPECTED   Quetiapine                     PRESENT      UNEXPECTED   Ibuprofen                      PRESENT      UNEXPECTED ==================================================================== Test                      Result    Flag   Units      Ref Range   Creatinine              171              mg/dL      >=20 ==================================================================== Declared Medications:  The flagging and interpretation on this report are based on the  following declared medications.  Unexpected results may arise from  inaccuracies in the declared medications.  **Note: The testing scope of this panel includes these medications:  Atenolol  Oxycodone (Oxycodone Acetaminophen)  **Note: The testing scope of this  panel does not include small to  moderate amounts of these reported medications:  Acetaminophen (Oxycodone Acetaminophen)  **Note: The testing scope of this panel does not include following  reported medications:  Lisinopril ==================================================================== For clinical consultation, please call [phone]. ====================================================================    UDS interpretation: Compliant          Medication Assessment Form: Reviewed. Patient indicates being compliant with therapy Treatment compliance: Compliant Risk Assessment Profile: Aberrant behavior: See prior evaluations. None observed or detected today Comorbid factors increasing risk of overdose: See prior notes. No additional risks detected today Risk of substance use disorder (SUD): Low Opioid Risk Tool - 12/29/16 1025      Family History of Substance Abuse   Alcohol  Positive Female    Illegal Drugs  Negative    Rx Drugs  Negative      Personal History of Substance Abuse   Alcohol  Negative    Illegal Drugs  Negative    Rx Drugs  Negative      History of Preadolescent Sexual Abuse   History of Preadolescent Sexual Abuse  Negative or Female      Psychological Disease   Psychological Disease  Negative    Depression  Negative      Total Score   Opioid Risk Tool Scoring  1    Opioid Risk Interpretation  Low Risk      ORT Scoring interpretation table:  Score <3 = Low Risk for SUD  Score between 4-7 = Moderate Risk for SUD  Score >8 = High Risk for Opioid Abuse   Risk Mitigation Strategies:  Patient Counseling: Covered Patient-Prescriber Agreement (PPA): Present and active  Notification to other healthcare providers: Done  Pharmacologic Plan: No change in therapy, at this time  Laboratory Chemistry  Inflammation Markers (CRP: Acute Phase) (ESR: Chronic Phase) Lab Results  Component Value Date   CRP 1.2 11/10/2016   ESRSEDRATE 5 11/10/2016                  Rheumatology Markers No results found for: RF,  ANA, LABURICZorita Pang, [facility]              Renal Function Markers Lab Results  Component Value Date   BUN 18 11/10/2016   CREATININE 0.87 11/10/2016   GFRAA 83 11/10/2016   GFRNONAA 72 11/10/2016                 Hepatic Function Markers Lab Results  Component Value Date   AST 11 11/10/2016   ALT 13 (L) 12/31/2015   ALBUMIN 4.2 11/10/2016   ALKPHOS 81 11/10/2016   LIPASE 33 07/16/2014                 Electrolytes Lab Results  Component Value Date   NA 141 11/10/2016   K 3.8 11/10/2016   CL 100 11/10/2016   CALCIUM 9.3 11/10/2016   MG 2.0 11/10/2016                 Neuropathy Markers Lab Results  Component Value Date   VITAMINB12 615 11/10/2016                 Bone Pathology Markers Lab Results  Component Value Date   VD25OH 13.5 (L) 12/31/2015   25OHVITD1 13 (L) 11/10/2016   25OHVITD2 <1.0 11/10/2016   25OHVITD3 13 11/10/2016                 Coagulation Parameters Lab Results  Component Value Date   INR 1.0 12/30/2013   LABPROT 13.2 12/30/2013   APTT 28.9 12/30/2013   PLT 214 12/31/2015                 Cardiovascular Markers Lab Results  Component Value Date   CKTOTAL 36 (L) 12/31/2015   TROPONINI <0.03 12/31/2015   HGB 13.5 12/31/2015   HCT 39.3 12/31/2015                 CA Markers No results found for: CEA, CA125, LABCA2               Note: Lab results reviewed.  Recent Diagnostic Imaging Results  DG C-Arm 1-60 Min-No Report Fluoroscopy was utilized by the requesting physician.  No radiographic  interpretation.   Complexity Note: Imaging results reviewed. Results shared with Ms. Gotwalt, using Layman's terms.                         Meds   Current Outpatient Medications:  .  atenolol (TENORMIN) 100 MG tablet, Take by mouth daily. , Disp: , Rfl:  .  Calcium Carb-Cholecalciferol (CALCIUM PLUS D3 ABSORBABLE) [id number] MG-UNIT CAPS, Take 1 capsule by mouth daily  with breakfast., Disp: 90 capsule, Rfl: 0 .  Cholecalciferol 5000 units capsule, Take 1 capsule (5,000 Units total) by mouth daily., Disp: 90 capsule, Rfl: 0 .  ibuprofen (ADVIL,MOTRIN) 200 MG tablet, Take 200 mg by mouth every 6 (six) hours as needed., Disp: , Rfl:  .  lisinopril (PRINIVIL,ZESTRIL) 10 MG tablet, Take 10 mg by mouth. , Disp: , Rfl:  .  [START ON 01/30/2017] oxyCODONE (OXY IR/ROXICODONE) 5 MG immediate release tablet, Take 1 tablet (5 mg total) by mouth every 6 (six) hours as needed for severe pain., Disp: 120 tablet, Rfl: 0  ROS  Constitutional: Denies any fever or chills Gastrointestinal: No reported hemesis, hematochezia, vomiting, or acute GI distress Musculoskeletal: Denies any acute onset joint swelling, redness, loss of ROM, or weakness Neurological: No reported episodes of acute onset apraxia, aphasia, dysarthria,  agnosia, amnesia, paralysis, loss of coordination, or loss of consciousness  Allergies  Ms. Deakins is allergic to penicillins.  Anna  Drug: Ms. Mondesir  reports that she does not use drugs. Alcohol:  reports that she drinks about 0.6 oz of alcohol per week. Tobacco:  reports that  has never smoked. she has never used smokeless tobacco. Medical:  has a past medical history of Abdominal pain (12/22/2012), Abnormal EKG (12/11/2011), Altered mental status (12/11/2011), Anxiety, Benzodiazepine dependence ([place]) (12/14/2011), Charcot-Marie disease, Chronic anxiety (12/14/2011), Dehydration (03/08/971), Diastolic dysfunction (53/29/9242), Drug withdrawal ([place]) (12/12/2011), Hypertension, Hypokalemia (12/11/2011), and Psychosis ([place]) (12/12/2011). Surgical: Ms. Sylva  has a past surgical history that includes Abdominal hysterectomy. Family: family history includes Charcot-Marie-Tooth disease in her mother and son; Heart disease in her father.  Constitutional Exam  General appearance: alert and cooperative Vitals:   01/19/17 1402  BP: (!) 208/111  Pulse: (!) 58   Resp: 16  Temp: 98.4 F (36.9 C)  TempSrc: Oral  SpO2: 100%  Weight: 140 lb (63.5 kg)  Height: '5\' 4"'  (1.626 m)   BMI Assessment: Estimated body mass index is 24.03 kg/m as calculated from the following:   Height as of this encounter: '5\' 4"'  (1.626 m).   Weight as of this encounter: 140 lb (63.5 kg). Psych/Mental status: Alert, oriented x 3 (person, place, & time)      left  facial swelling  Eyes: PERLA Respiratory: No evidence of acute respiratory distress  Cervical Spine Area Exam  Skin & Axial Inspection: No masses, redness, edema, swelling, or associated skin lesions Alignment: Symmetrical Functional ROM: Unrestricted ROM      Stability: No instability detected Muscle Tone/Strength: Functionally intact. No obvious neuro-muscular anomalies detected. Sensory (Neurological): Unimpaired Palpation: No palpable anomalies              Upper Extremity (UE) Exam    Side: Right upper extremity  Side: Left upper extremity  Skin & Extremity Inspection: Skin color, temperature, and hair growth are WNL. No peripheral edema or cyanosis. No masses, redness, swelling, asymmetry, or associated skin lesions. No contractures.  Skin & Extremity Inspection: Skin color, temperature, and hair growth are WNL. No peripheral edema or cyanosis. No masses, redness, swelling, asymmetry, or associated skin lesions. No contractures.  Functional ROM: Unrestricted ROM          Functional ROM: Unrestricted ROM          Muscle Tone/Strength: Functionally intact. No obvious neuro-muscular anomalies detected.  Muscle Tone/Strength: Functionally intact. No obvious neuro-muscular anomalies detected.  Sensory (Neurological): Unimpaired          Sensory (Neurological): Unimpaired          Palpation: No palpable anomalies              Palpation: No palpable anomalies              Specialized Test(s): Deferred         Specialized Test(s): Deferred          Thoracic Spine Area Exam  Skin & Axial Inspection: No masses,  redness, or swelling Alignment: Symmetrical Functional ROM: Unrestricted ROM Stability: No instability detected Muscle Tone/Strength: Functionally intact. No obvious neuro-muscular anomalies detected. Sensory (Neurological): Unimpaired Muscle strength & Tone: No palpable anomalies  Lumbar Spine Area Exam  Skin & Axial Inspection: No masses, redness, or swelling Alignment: Symmetrical Functional ROM: Unrestricted ROM      Stability: No instability detected Muscle Tone/Strength: Functionally intact. No obvious neuro-muscular anomalies detected. Sensory (Neurological):  Unimpaired Palpation: No palpable anomalies       Provocative Tests: Lumbar Hyperextension and rotation test: evaluation deferred today       Lumbar Lateral bending test: evaluation deferred today       Patrick's Maneuver: evaluation deferred today                    Gait & Posture Assessment  Ambulation: Unassisted Gait: Relatively normal for age and body habitus Posture: WNL   Lower Extremity Exam    Side: Right lower extremity  Side: Left lower extremity  Skin & Extremity Inspection: Skin color, temperature, and hair growth are WNL. No peripheral edema or cyanosis. No masses, redness, swelling, asymmetry, or associated skin lesions. No contractures.  Skin & Extremity Inspection: Skin color, temperature, and hair growth are WNL. No peripheral edema or cyanosis. No masses, redness, swelling, asymmetry, or associated skin lesions. No contractures.  Functional ROM: Unrestricted ROM          Functional ROM: Unrestricted ROM          Muscle Tone/Strength: Functionally intact. No obvious neuro-muscular anomalies detected.  Muscle Tone/Strength: Functionally intact. No obvious neuro-muscular anomalies detected.  Sensory (Neurological): Unimpaired  Sensory (Neurological): Unimpaired  Palpation: No palpable anomalies  Palpation: No palpable anomalies   Assessment  Primary Diagnosis & Pertinent Problem List: The primary  encounter diagnosis was Chronic neck pain (Tertiary Area of Pain) (Posterior) (Bilateral) (L>R). Diagnoses of Chronic lumbar radiculopathy (L5/S1) (Left), Chronic knee pain (B) (L>R), CMT (Charcot-Marie-Tooth disease), and Chronic pain syndrome were also pertinent to this visit.  Status Diagnosis  Controlled Controlled Controlled 1. Chronic neck pain (Tertiary Area of Pain) (Posterior) (Bilateral) (L>R)   2. Chronic lumbar radiculopathy (L5/S1) (Left)   3. Chronic knee pain (B) (L>R)   4. CMT (Charcot-Marie-Tooth disease)   5. Chronic pain syndrome     Problems updated and reviewed during this visit: No problems updated. Plan of Care  Pharmacotherapy (Medications Ordered): Meds ordered this encounter  Medications  . oxyCODONE (OXY IR/ROXICODONE) 5 MG immediate release tablet    Sig: Take 1 tablet (5 mg total) by mouth every 6 (six) hours as needed for severe pain.    Dispense:  120 tablet    Refill:  0    Do not place this medication, or any other prescription from our practice, on "Automatic Refill". Patient may have prescription filled one day early if pharmacy is closed on scheduled refill date. Do not fill until: 01/30/2017 To last until: 03/01/2017    Order Specific Question:   Supervising Provider    Answer:   Milinda Pointer [phone]  This SmartLink is deprecated. Use AVSMEDLIST instead to display the medication list for a patient. Medications administered today: Chinenye L. [employer]" had no medications administered during this visit. Lab-work, procedure(s), and/or referral(s): No orders of the defined types were placed in this encounter.  Imaging and/or referral(s): None  Interventional management options: Planned, scheduled, and/or pending:   None at this time.   Considering:   Diagnostic right-sided L5-S1 lumbar epidural steroid injection Diagnostic bilateral lumbar facet block Possiblebilateral lumbar facet RFA Diagnostic left sided cervical epidural  steroid injection  Diagnostic bilateral cervical facet block Possible bilateral cervical facet RFA Diagnostic right shoulder injection Diagnostic right suprascapular nerve block Possible right sided suprascapular nerve RFA   Palliative PRN treatment(s):   Diagnostic left sided cervical epidural steroid injection    Provider-requested follow-up: Return in about 4 weeks (around 02/16/2017) for MedMgmt with Me (Crystal  Edison Pace).  Future Appointments  Date Time Provider [place]  02/10/2017 11:15 AM Vevelyn Francois, NP [facility] None   Primary Care Physician: Ricardo Jericho, NP Location: [facility] Management Facility Note by: Vevelyn Francois NP Date: 01/19/2017; Time: 3:33 PM  Pain Score Disclaimer: We use the NRS-11 scale. This is a self-reported, subjective measurement of pain severity with only modest accuracy. It is used primarily to identify changes within a particular patient. It must be understood that outpatient pain scales are significantly less accurate that those used for research, where they can be applied under ideal controlled circumstances with minimal exposure to variables. In reality, the score is likely to be a combination of pain intensity and pain affect, where pain affect describes the degree of emotional arousal or changes in action readiness caused by the sensory experience of pain. Factors such as social and work situation, setting, emotional state, anxiety levels, expectation, and prior pain experience may influence pain perception and show large inter-individual differences that may also be affected by time variables.  Patient instructions provided during this appointment: Patient Instructions   ____________________________________________________________________________________________  Medication Rules  Applies to: All patients receiving prescriptions (written or electronic).  Pharmacy of record: Pharmacy where electronic  prescriptions will be sent. If written prescriptions are taken to a different pharmacy, please inform the nursing staff. The pharmacy listed in the electronic medical record should be the one where you would like electronic prescriptions to be sent.  Prescription refills: Only during scheduled appointments. Applies to both, written and electronic prescriptions.  NOTE: The following applies primarily to controlled substances (Opioid* Pain Medications).   Patient's responsibilities: 1. Pain Pills: Bring all pain pills to every appointment (except for procedure appointments). 2. Pill Bottles: Bring pills in original pharmacy bottle. Always bring newest bottle. Bring bottle, even if empty. 3. Medication refills: You are responsible for knowing and keeping track of what medications you need refilled. The day before your appointment, write a list of all prescriptions that need to be refilled. Bring that list to your appointment and give it to the admitting nurse. Prescriptions will be written only during appointments. If you forget a medication, it will not be "Called in", "Faxed", or "electronically sent". You will need to get another appointment to get these prescribed. 4. Prescription Accuracy: You are responsible for carefully inspecting your prescriptions before leaving our office. Have the discharge nurse carefully go over each prescription with you, before taking them home. Make sure that your name is accurately spelled, that your address is correct. Check the name and dose of your medication to make sure it is accurate. Check the number of pills, and the written instructions to make sure they are clear and accurate. Make sure that you are given enough medication to last until your next medication refill appointment. 5. Taking Medication: Take medication as prescribed. Never take more pills than instructed. Never take medication more frequently than prescribed. Taking less pills or less frequently is  permitted and encouraged, when it comes to controlled substances (written prescriptions).  6. Inform other Doctors: Always inform, all of your healthcare providers, of all the medications you take. 7. Pain Medication from other Providers: You are not allowed to accept any additional pain medication from any other Doctor or Healthcare provider. There are two exceptions to this rule. (see below) In the event that you require additional pain medication, you are responsible for notifying us, as stated below. 8. Medication Agreement: You are responsible for carefully reading  and following our Medication Agreement. This must be signed before receiving any prescriptions from our practice. Safely store a copy of your signed Agreement. Violations to the Agreement will result in no further prescriptions. (Additional copies of our Medication Agreement are available upon request.) 9. Laws, Rules, & Regulations: All patients are expected to follow all Federal and [employer], [employer], Rules, [facility]. Ignorance of the Laws does not constitute a valid excuse. The use of any illegal substances is prohibited. 10. Adopted CDC guidelines & recommendations: Target dosing levels will be at or below 60 MME/day. Use of benzodiazepines** is not recommended.  Exceptions: There are only two exceptions to the rule of not receiving pain medications from other Healthcare Providers. 1. Exception #1 (Emergencies): In the event of an emergency (i.e.: accident requiring emergency care), you are allowed to receive additional pain medication. However, you are responsible for: As soon as you are able, call our office [phone], at any time of the day or night, and leave a message stating your name, the date and nature of the emergency, and the name and dose of the medication prescribed. In the event that your call is answered by a member of our staff, make sure to document and save the date, time, and the name of the person that  took your information.  2. Exception #2 (Planned Surgery): In the event that you are scheduled by another doctor or dentist to have any type of surgery or procedure, you are allowed (for a period no longer than 30 days), to receive additional pain medication, for the acute post-op pain. However, in this case, you are responsible for picking up a copy of our "Post-op Pain Management for Surgeons" handout, and giving it to your surgeon or dentist. This document is available at our office, and does not require an appointment to obtain it. Simply go to our office during business hours (Monday-Thursday from 8:00 AM to 4:00 PM) (Friday 8:00 AM to 12:00 Noon) or if you have a scheduled appointment with [place], prior to your surgery, and ask for it by name. In addition, you will need to provide [place] with your name, name of your surgeon, type of surgery, and date of procedure or surgery.  *Opioid medications include: morphine, codeine, oxycodone, oxymorphone, hydrocodone, hydromorphone, meperidine, tramadol, tapentadol, buprenorphine, fentanyl, methadone. **Benzodiazepine medications include: diazepam (Valium), alprazolam (Xanax), clonazepam (Klonopine), lorazepam (Ativan), clorazepate (Tranxene), chlordiazepoxide (Librium), estazolam (Prosom), oxazepam (Serax), temazepam (Restoril), triazolam (Halcion)  ____________________________________________________________________________________________

## 2017-02-10 ENCOUNTER — Encounter: Payer: Medicare Other | Admitting: Nurse Practitioner

## 2017-02-17 ENCOUNTER — Ambulatory Visit: Payer: Medicare Other | Admitting: Nurse Practitioner

## 2017-02-23 ENCOUNTER — Other Ambulatory Visit: Payer: Self-pay

## 2017-02-23 ENCOUNTER — Ambulatory Visit: Payer: Medicare Other | Attending: Nurse Practitioner | Admitting: Nurse Practitioner

## 2017-02-23 ENCOUNTER — Encounter: Payer: Self-pay | Admitting: Nurse Practitioner

## 2017-02-23 VITALS — BP 220/113 | HR 50 | Temp 98.2°F | Resp 18 | Ht 65.0 in | Wt 140.0 lb

## 2017-02-23 DIAGNOSIS — Z8489 Family history of other specified conditions: Secondary | ICD-10-CM | POA: Diagnosis not present

## 2017-02-23 DIAGNOSIS — M5416 Radiculopathy, lumbar region: Secondary | ICD-10-CM | POA: Diagnosis not present

## 2017-02-23 DIAGNOSIS — G600 Hereditary motor and sensory neuropathy: Secondary | ICD-10-CM | POA: Diagnosis not present

## 2017-02-23 DIAGNOSIS — E876 Hypokalemia: Secondary | ICD-10-CM | POA: Insufficient documentation

## 2017-02-23 DIAGNOSIS — G894 Chronic pain syndrome: Secondary | ICD-10-CM | POA: Diagnosis not present

## 2017-02-23 DIAGNOSIS — G950 Syringomyelia and syringobulbia: Secondary | ICD-10-CM | POA: Diagnosis not present

## 2017-02-23 DIAGNOSIS — M542 Cervicalgia: Secondary | ICD-10-CM | POA: Diagnosis not present

## 2017-02-23 DIAGNOSIS — G629 Polyneuropathy, unspecified: Secondary | ICD-10-CM | POA: Diagnosis not present

## 2017-02-23 DIAGNOSIS — R001 Bradycardia, unspecified: Secondary | ICD-10-CM | POA: Insufficient documentation

## 2017-02-23 DIAGNOSIS — M7918 Myalgia, other site: Secondary | ICD-10-CM | POA: Diagnosis not present

## 2017-02-23 DIAGNOSIS — Z880 Allergy status to penicillin: Secondary | ICD-10-CM | POA: Insufficient documentation

## 2017-02-23 DIAGNOSIS — G8929 Other chronic pain: Secondary | ICD-10-CM

## 2017-02-23 DIAGNOSIS — Z79899 Other long term (current) drug therapy: Secondary | ICD-10-CM | POA: Diagnosis not present

## 2017-02-23 DIAGNOSIS — E860 Dehydration: Secondary | ICD-10-CM | POA: Diagnosis not present

## 2017-02-23 DIAGNOSIS — M79601 Pain in right arm: Secondary | ICD-10-CM

## 2017-02-23 DIAGNOSIS — M79604 Pain in right leg: Secondary | ICD-10-CM | POA: Diagnosis not present

## 2017-02-23 DIAGNOSIS — M5116 Intervertebral disc disorders with radiculopathy, lumbar region: Secondary | ICD-10-CM | POA: Diagnosis not present

## 2017-02-23 DIAGNOSIS — Z90710 Acquired absence of both cervix and uterus: Secondary | ICD-10-CM | POA: Diagnosis not present

## 2017-02-23 DIAGNOSIS — G608 Other hereditary and idiopathic neuropathies: Secondary | ICD-10-CM | POA: Diagnosis not present

## 2017-02-23 DIAGNOSIS — E559 Vitamin D deficiency, unspecified: Secondary | ICD-10-CM | POA: Insufficient documentation

## 2017-02-23 DIAGNOSIS — M79605 Pain in left leg: Secondary | ICD-10-CM | POA: Diagnosis not present

## 2017-02-23 DIAGNOSIS — Z791 Long term (current) use of non-steroidal anti-inflammatories (NSAID): Secondary | ICD-10-CM | POA: Insufficient documentation

## 2017-02-23 DIAGNOSIS — I10 Essential (primary) hypertension: Secondary | ICD-10-CM | POA: Diagnosis not present

## 2017-02-23 DIAGNOSIS — M79602 Pain in left arm: Secondary | ICD-10-CM | POA: Diagnosis not present

## 2017-02-23 DIAGNOSIS — Z8249 Family history of ischemic heart disease and other diseases of the circulatory system: Secondary | ICD-10-CM | POA: Insufficient documentation

## 2017-02-23 DIAGNOSIS — Z9889 Other specified postprocedural states: Secondary | ICD-10-CM | POA: Diagnosis not present

## 2017-02-23 MED ORDER — OXYCODONE HCL 5 MG PO TABS: 5.0000 mg | ORAL_TABLET | Freq: Four times a day (QID) | ORAL | 0 refills | Status: DC | PRN

## 2017-02-23 MED ORDER — GABAPENTIN 100 MG PO CAPS
100.0000 mg | ORAL_CAPSULE | Freq: Every day | ORAL | 0 refills | Status: DC
Start: 2017-02-23 — End: 2017-03-23

## 2017-02-23 MED ORDER — GABAPENTIN 100 MG PO CAPS: 100.0000 mg | ORAL_CAPSULE | Freq: Every day | ORAL | 0 refills | Status: DC

## 2017-02-23 NOTE — Progress Notes (Signed)
Patient's Name: Maria Wheeler  MRN: [id number]  Referring Provider: Ricardo Jericho*  DOB: 17-Jul-1955  PCP: Ricardo Jericho, NP  DOS: 02/23/2017  Note by: Vevelyn Francois NP  Service setting: Ambulatory outpatient  Specialty: Interventional Pain Management  Location: [facility] (AMB) Pain Management Facility    Patient type: Established    Primary Reason(s) for Visit: Encounter for prescription drug management. (Level of risk: moderate)  CC: Pain ("all over")  HPI  Ms. Kaman is a 62 y.o. year old, female patient, who comes today for a medication management evaluation. She has HTN (hypertension), benign; Auditory hallucinations; History of drug dependence ([place]); Benzodiazepine dependence ([place]); Chronic anxiety; Diastolic dysfunction; Multiple falls; UTI (urinary tract infection); Hypokalemia; Depression with anxiety; Benzodiazepine abuse ([place]); Chronic musculoskeletal pain; CMT (Charcot-Marie-Tooth disease); H/O: stroke; SDH (subdural hematoma) ([place]); Disorder of skeletal system; Pharmacologic therapy; Problems influencing health status; Chronic neck pain (Tertiary Area of Pain) (Posterior) (Bilateral) (L>R); DDD (degenerative disc disease), cervical; Cervical spondylosis; Chronic shoulder pain (Right); Chronic upper extremity pain (Left); Chronic pain syndrome; Chronic knee pain (B) (L>R); Chronic lumbar radiculopathy (L5/S1) (Left); Chronic lower extremity pain (Primary Area of Pain) (Bilateral) (L>R); Cervical Grade 1 Retrolisthesis of C5 over C6; Abnormal nerve conduction studies; Chronic upper extremity polyneuropathy (severe) (CMT); 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[place]); Adjustment disorder with mixed anxiety and depressed mood; Chronic upper extremity pain (Secondary Area of Pain) (Bilateral) (L>R); DDD (degenerative disc disease), lumbar; Lumbar facet arthropathy (Bilateral); 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 Her primarily concern today is the Pain ("all over")  Pain Assessment: Location:   (all over) Onset: More than a month ago Duration: Chronic pain Quality: Aching, Constant Severity: 6 /10 (self-reported pain score)  Note: Reported level is compatible with observation. Clinically the patient looks like a 3/10 A 3/10 is viewed as "Moderate" and described as significantly interfering with activities of daily living (ADL). It becomes difficult to feed, bathe, get dressed, get on and off the toilet or to perform personal hygiene functions. Difficult to get in and out of bed or a chair without assistance. Very distracting. With effort, it can be ignored when deeply involved in activities. Information on the proper use of the pain scale provided to the patient today. When using our objective Pain Scale, levels between 6 and 10/10 are said to belong in an emergency room, as it progressively worsens from a 6/10, described as severely limiting, requiring emergency care not usually available at an outpatient pain management facility. At a 6/10 level, communication becomes difficult and requires great effort. Assistance to reach the emergency department may be required. Facial flushing and profuse sweating along with potentially dangerous increases in heart rate and blood pressure will be evident. Modifying factors: medications, heat  Ms. Maxton was last scheduled for an appointment on 01/19/2017 for medication management. During today's appointment we reviewed Ms. Coral's chronic pain status, as well as her outpatient medication regimen. She is concern about dizziness and her balance. She admits that her mother had Charcot-Marie-Tooth disease and she  wants to be more proactive.   The patient  reports that she does not use drugs. Her body mass index is 23.3 kg/m.  Further details on both, my assessment(s), as well as the proposed treatment plan, please see below.  Controlled Substance Pharmacotherapy Assessment REMS (Risk Evaluation and Mitigation Strategy)  Analgesic:Oxycodone IR 5 mg 1 tablet by mouth every 6 hours when necessary (24m/dayof oxycodone) MME/day:329mday.    ShHart RochesterRN  02/23/2017 12:58 PM  Sign at close encounter Nursing Pain Medication Assessment:  Safety precautions to be maintained throughout the outpatient stay will include: orient to surroundings, keep bed in low position, maintain call bell within reach at all times, provide assistance with transfer out of bed and ambulation.  Medication Inspection Compliance: Pill count conducted under aseptic conditions, in front of the patient. Neither the pills nor the bottle was removed from the patient's sight at any time. Once count was completed pills were immediately returned to the patient in their original bottle.  Medication: Oxycodone IR Pill/Patch Count: 15 of 120 pills remain Pill/Patch Appearance: Markings consistent with prescribed medication Bottle Appearance: Standard pharmacy container. Clearly labeled. Filled Date: 1271 29 / 2018 Last Medication intake:  Today   Pharmacokinetics: Liberation and absorption (onset of action): WNL Distribution (time to peak effect): WNL Metabolism and excretion (duration of action): WNL         Pharmacodynamics: Desired effects: Analgesia: Ms. MuNollereports >50% benefit. Functional ability: Patient reports that medication allows her to accomplish basic ADLs Clinically meaningful improvement in function (CMIF): Sustained CMIF goals met Perceived effectiveness: Described as relatively effective, allowing for increase in activities of daily living (ADL) Undesirable effects: Side-effects or Adverse  reactions: None reported Monitoring: [place] PMP: Online review of the past 1212-monthriod conducted. Compliant with practice rules and regulations Last UDS on record: Summary  Date Value Ref Range Status  11/10/2016 FINAL  Final    Comment:    ==================================================================== TOXASSURE COMP DRUG ANALYSIS,UR ==================================================================== Test                             Result       Flag       Units Drug Present and Declared for Prescription Verification   Oxycodone                      802          EXPECTED   ng/mg creat   Oxymorphone                    350          EXPECTED   ng/mg creat   Noroxycodone                   578          EXPECTED   ng/mg creat   Noroxymorphone                 93           EXPECTED   ng/mg creat    Sources of oxycodone are scheduled prescription medications.    Oxymorphone, noroxycodone, and noroxymorphone are expected    metabolites of oxycodone. Oxymorphone is also available as a    scheduled prescription medication.   Acetaminophen                  PRESENT      EXPECTED   Atenolol                       PRESENT      EXPECTED Drug Present not Declared for Prescription Verification  Lorazepam                      953          UNEXPECTED ng/mg creat    Source of lorazepam is a scheduled prescription medication.   Trazodone                      PRESENT      UNEXPECTED   Quetiapine                     PRESENT      UNEXPECTED   Ibuprofen                      PRESENT      UNEXPECTED ==================================================================== Test                      Result    Flag   Units      Ref Range   Creatinine              171              mg/dL      >=20 ==================================================================== Declared Medications:  The flagging and interpretation on this report are based on the  following declared medications.  Unexpected results may arise  from  inaccuracies in the declared medications.  **Note: The testing scope of this panel includes these medications:  Atenolol  Oxycodone (Oxycodone Acetaminophen)  **Note: The testing scope of this panel does not include small to  moderate amounts of these reported medications:  Acetaminophen (Oxycodone Acetaminophen)  **Note: The testing scope of this panel does not include following  reported medications:  Lisinopril ==================================================================== For clinical consultation, please call [phone]. ====================================================================    UDS interpretation: Compliant          Medication Assessment Form: Reviewed. Patient indicates being compliant with therapy Treatment compliance: Compliant Risk Assessment Profile: Aberrant behavior: See prior evaluations. None observed or detected today Comorbid factors increasing risk of overdose: See prior notes. No additional risks detected today Risk of substance use disorder (SUD): Low Opioid Risk Tool - 12/29/16 1025      Family History of Substance Abuse   Alcohol  Positive Female    Illegal Drugs  Negative    Rx Drugs  Negative      Personal History of Substance Abuse   Alcohol  Negative    Illegal Drugs  Negative    Rx Drugs  Negative      History of Preadolescent Sexual Abuse   History of Preadolescent Sexual Abuse  Negative or Female      Psychological Disease   Psychological Disease  Negative    Depression  Negative      Total Score   Opioid Risk Tool Scoring  1    Opioid Risk Interpretation  Low Risk      ORT Scoring interpretation table:  Score <3 = Low Risk for SUD  Score between 4-7 = Moderate Risk for SUD  Score >8 = High Risk for Opioid Abuse   Risk Mitigation Strategies:  Patient Counseling: Covered Patient-Prescriber Agreement (PPA): Present and active  Notification to other healthcare providers: Done  Pharmacologic Plan: No change  in therapy, at this time.             Laboratory Chemistry  Inflammation Markers (CRP: Acute Phase) (ESR: Chronic Phase) Lab Results  Component Value Date  CRP 1.2 11/10/2016   ESRSEDRATE 5 11/10/2016                 Rheumatology Markers No results found for: RF, ANA, [facility], LYMEIGGIGMAB, [facility]              Renal Function Markers Lab Results  Component Value Date   BUN 18 11/10/2016   CREATININE 0.87 11/10/2016   GFRAA 83 11/10/2016   GFRNONAA 72 11/10/2016                 Hepatic Function Markers Lab Results  Component Value Date   AST 11 11/10/2016   ALT 13 (L) 12/31/2015   ALBUMIN 4.2 11/10/2016   ALKPHOS 81 11/10/2016   LIPASE 33 07/16/2014                 Electrolytes Lab Results  Component Value Date   NA 141 11/10/2016   K 3.8 11/10/2016   CL 100 11/10/2016   CALCIUM 9.3 11/10/2016   MG 2.0 11/10/2016                 Neuropathy Markers Lab Results  Component Value Date   VITAMINB12 615 11/10/2016                 Bone Pathology Markers Lab Results  Component Value Date   VD25OH 13.5 (L) 12/31/2015   25OHVITD1 13 (L) 11/10/2016   25OHVITD2 <1.0 11/10/2016   25OHVITD3 13 11/10/2016                 Coagulation Parameters Lab Results  Component Value Date   INR 1.0 12/30/2013   LABPROT 13.2 12/30/2013   APTT 28.9 12/30/2013   PLT 214 12/31/2015                 Cardiovascular Markers Lab Results  Component Value Date   CKTOTAL 36 (L) 12/31/2015   TROPONINI <0.03 12/31/2015   HGB 13.5 12/31/2015   HCT 39.3 12/31/2015                 CA Markers No results found for: CEA, CA125, LABCA2               Note: Lab results reviewed.  Recent Diagnostic Imaging Results  DG C-Arm 1-60 Min-No Report Fluoroscopy was utilized by the requesting physician.  No radiographic  interpretation.   Complexity Note: Imaging results reviewed. Results shared with Ms. Pringle, using Layman's terms.                         Meds   Current  Outpatient Medications:  .  atenolol (TENORMIN) 100 MG tablet, Take by mouth daily. , Disp: , Rfl:  .  Calcium Carb-Cholecalciferol (CALCIUM PLUS D3 ABSORBABLE) [phone] MG-UNIT CAPS, Take 1 capsule by mouth daily with breakfast., Disp: 90 capsule, Rfl: 0 .  Cholecalciferol 5000 units capsule, Take 1 capsule (5,000 Units total) by mouth daily., Disp: 90 capsule, Rfl: 0 .  ibuprofen (ADVIL,MOTRIN) 200 MG tablet, Take 200 mg by mouth every 6 (six) hours as needed., Disp: , Rfl:  .  lisinopril (PRINIVIL,ZESTRIL) 10 MG tablet, Take 10 mg by mouth. , Disp: , Rfl:  .  [START ON 03/01/2017] oxyCODONE (OXY IR/ROXICODONE) 5 MG immediate release tablet, Take 1 tablet (5 mg total) by mouth every 6 (six) hours as needed for severe pain., Disp: 120 tablet, Rfl: 0 .  gabapentin (NEURONTIN) 100 MG capsule, Take 1-3 capsules (100-300  mg total) by mouth at bedtime. Follow written titration schedule., Disp: 90 capsule, Rfl: 0  ROS  Constitutional: Denies any fever or chills Gastrointestinal: No reported hemesis, hematochezia, vomiting, or acute GI distress Musculoskeletal: Denies any acute onset joint swelling, redness, loss of ROM, or weakness Neurological: No reported episodes of acute onset apraxia, aphasia, dysarthria, agnosia, amnesia, paralysis, loss of coordination, or loss of consciousness  Allergies  Ms. Oviatt is allergic to penicillins.  [place]  Drug: Ms. Defino  reports that she does not use drugs. Alcohol:  reports that she drinks about 0.6 oz of alcohol per week. Tobacco:  reports that  has never smoked. she has never used smokeless tobacco. Medical:  has a past medical history of Abdominal pain (12/22/2012), Abnormal EKG (12/11/2011), Altered mental status (12/11/2011), Anxiety, Benzodiazepine dependence ([place]) (12/14/2011), Charcot-Marie disease, Chronic anxiety (12/14/2011), Dehydration (12/06/7260), Diastolic dysfunction (03/55/9741), Drug withdrawal ([place]) (12/12/2011), Hypertension, Hypokalemia  (12/11/2011), and Psychosis ([place]) (12/12/2011). Surgical: Ms. Kiker  has a past surgical history that includes Abdominal hysterectomy. Family: family history includes Charcot-Marie-Tooth disease in her mother and son; Heart disease in her father.  Constitutional Exam  General appearance: Well nourished, well developed, and well hydrated. In no apparent acute distress Vitals:   02/23/17 1253  BP: (!) 220/113  Pulse: (!) 50  Resp: 18  Temp: 98.2 F (36.8 C)  TempSrc: Oral  SpO2: 100%  Weight: 140 lb (63.5 kg)  Height: '5\' 5"'  (1.651 m)   BMI Assessment: Estimated body mass index is 23.3 kg/m as calculated from the following:   Height as of this encounter: '5\' 5"'  (1.651 m).   Weight as of this encounter: 140 lb (63.5 kg). Psych/Mental status: Alert, oriented x 3 (person, place, & time)       Eyes: PERLA  Swelling to left side of face Respiratory: No evidence of acute respiratory distress  Cervical Spine Area Exam  Skin & Axial Inspection: No masses, redness, edema, swelling, or associated skin lesions Alignment: Symmetrical Functional ROM: Unrestricted ROM      Stability: No instability detected Muscle Tone/Strength: Functionally intact. No obvious neuro-muscular anomalies detected. Sensory (Neurological): Unimpaired Palpation: No palpable anomalies              Upper Extremity (UE) Exam    Side: Right upper extremity  Side: Left upper extremity  Skin & Extremity Inspection: Skin color, temperature, and hair growth are WNL. No peripheral edema or cyanosis. No masses, redness, swelling, asymmetry, or associated skin lesions. No contractures.  Skin & Extremity Inspection: Skin color, temperature, and hair growth are WNL. No peripheral edema or cyanosis. No masses, redness, swelling, asymmetry, or associated skin lesions. No contractures.  Functional ROM: Unrestricted ROM          Functional ROM: Unrestricted ROM          Muscle Tone/Strength: Functionally intact. No obvious  neuro-muscular anomalies detected.  Muscle Tone/Strength: Functionally intact. No obvious neuro-muscular anomalies detected.  Sensory (Neurological): Unimpaired          Sensory (Neurological): Unimpaired          Palpation: No palpable anomalies              Palpation: No palpable anomalies              Specialized Test(s): Deferred         Specialized Test(s): Deferred          Thoracic Spine Area Exam  Skin & Axial Inspection: No masses, redness, or swelling Alignment:  Symmetrical Functional ROM: Unrestricted ROM Stability: No instability detected Muscle Tone/Strength: Functionally intact. No obvious neuro-muscular anomalies detected. Sensory (Neurological): Unimpaired Muscle strength & Tone: No palpable anomalies  Lumbar Spine Area Exam  Skin & Axial Inspection: No masses, redness, or swelling Alignment: Symmetrical Functional ROM: Unrestricted ROM      Stability: No instability detected Muscle Tone/Strength: Functionally intact. No obvious neuro-muscular anomalies detected. Sensory (Neurological): Unimpaired Palpation: No palpable anomalies       Provocative Tests: Lumbar Hyperextension and rotation test: evaluation deferred today       Lumbar Lateral bending test: evaluation deferred today       Patrick's Maneuver: evaluation deferred today                    Gait & Posture Assessment  Ambulation: Unassisted Gait: Relatively normal for age and body habitus Posture: WNL   Lower Extremity Exam    Side: Right lower extremity  Side: Left lower extremity  Skin & Extremity Inspection: Skin color, temperature, and hair growth are WNL. No peripheral edema or cyanosis. No masses, redness, swelling, asymmetry, or associated skin lesions. No contractures.  Skin & Extremity Inspection: Skin color, temperature, and hair growth are WNL. No peripheral edema or cyanosis. No masses, redness, swelling, asymmetry, or associated skin lesions. No contractures.  Functional ROM: Unrestricted ROM           Functional ROM: Unrestricted ROM          Muscle Tone/Strength: Functionally intact. No obvious neuro-muscular anomalies detected.  Muscle Tone/Strength: Functionally intact. No obvious neuro-muscular anomalies detected.  Sensory (Neurological): Unimpaired  Sensory (Neurological): Unimpaired  Palpation: No palpable anomalies  Palpation: No palpable anomalies   Assessment  Primary Diagnosis & Pertinent Problem List: The primary encounter diagnosis was CMT (Charcot-Marie-Tooth disease). 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  Status Diagnosis  Controlled Controlled Controlled 1. CMT (Charcot-Marie-Tooth disease)   2. Chronic pain syndrome   3. Chronic upper extremity pain (Secondary Area of Pain) (Bilateral) (L>R)   4. Chronic neck pain (Tertiary Area of Pain) (Posterior) (Bilateral) (L>R)   5. Chronic lumbar radiculopathy (L5/S1) (Left)   6. Chronic lower extremity pain (Primary Area of Pain) (Bilateral) (L>R)   7. 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 Plan of Care  Pharmacotherapy (Medications Ordered): Meds ordered this encounter  Medications  . oxyCODONE (OXY IR/ROXICODONE) 5 MG immediate release tablet    Sig: Take 1 tablet (5 mg total) by mouth every 6 (six) hours as needed for severe pain.    Dispense:  120 tablet    Refill:  0    Do not place this medication, or any other prescription from our practice, on "Automatic Refill". Patient may have prescription filled one day early if pharmacy is  closed on scheduled refill date. Do not fill until: 03/01/2017 To last until: 03/31/2017    Order Specific Question:   Supervising Provider    Answer:   Milinda Pointer [phone]  . gabapentin (NEURONTIN) 100 MG capsule    Sig: Take 1-3 capsules (100-300 mg total) by mouth at bedtime. Follow written titration schedule.    Dispense:  90 capsule    Refill:  0    Do not place medication on "Automatic Refill". Fill one day early if pharmacy is closed on scheduled refill date.    Order Specific Question:   Supervising Provider    Answer:   Milinda Pointer [phone]   New Prescriptions   GABAPENTIN (NEURONTIN) 100 MG CAPSULE    Take 1-3 capsules (100-300 mg total) by mouth at bedtime. Follow written titration schedule.   Medications administered today: Sahar L. [employer]" had no medications administered during this visit. Lab-work, procedure(s), and/or referral(s): Orders Placed This Encounter  Procedures  . Ambulatory referral to Physical Therapy   Imaging and/or referral(s): AMB REFERRAL TO PHYSICAL THERAPY  Interventional management options: Planned, scheduled, and/or pending: None at this time.   Considering: Diagnostic right-sided L5-S1 lumbar epidural steroid injection Diagnostic bilateral lumbar facet block Possiblebilateral lumbar facet RFA Diagnostic left sided cervical epidural steroid injection  Diagnostic bilateral cervical facet block Possible bilateral cervical facet RFA Diagnostic right shoulder injection Diagnostic right suprascapular nerve block Possible right sided suprascapular nerve RFA   Palliative PRN treatment(s): Diagnostic left sided cervical epidural steroid injection    Provider-requested follow-up: Return in about 4 weeks (around 03/23/2017) for MedMgmt with Me Dionisio David).  Future Appointments  Date Time Provider [place]  03/23/2017 10:45 AM Vevelyn Francois, NP [facility] None   Primary Care Physician:  Ricardo Jericho, NP Location: [facility] Outpatient Pain Management Facility Note by: Vevelyn Francois NP Date: 02/23/2017; Time: 1:02 PM  Pain Score Disclaimer: We use the NRS-11 scale. This is a self-reported, subjective measurement of pain severity with only modest accuracy. It is used primarily to identify changes within a particular patient. It must be understood that outpatient pain scales are significantly less accurate that those used for research, where they can be applied under ideal controlled circumstances with minimal exposure to variables. In reality, the score is likely to be a combination of pain intensity and pain affect, where pain affect describes the degree of emotional arousal or changes in action readiness caused by the sensory experience of pain. Factors such as social and work situation, setting, emotional state, anxiety levels, expectation, and prior pain experience may influence pain perception and show large inter-individual differences that may also be affected by time variables.  Patient instructions provided during this appointment: Patient Instructions   ____________________________________________________________________________________________  Medication Rules  Applies to: All patients receiving prescriptions (written or electronic).  Pharmacy of record: Pharmacy where electronic prescriptions will be sent. If written prescriptions are taken to a different pharmacy, please inform the nursing staff. The pharmacy listed in the electronic medical record should be the one where you would like electronic prescriptions to be sent.  Prescription refills: Only during scheduled appointments. Applies to both, written and electronic prescriptions.  NOTE: The following applies primarily to controlled substances (Opioid* Pain Medications).   Patient's responsibilities: 1. Pain Pills: Bring all pain pills to every appointment (except for procedure appointments). 2. Pill  Bottles: Bring pills in original pharmacy bottle. Always bring newest bottle. Bring bottle, even if empty. 3. Medication refills: You are responsible for knowing and keeping track of what medications you need refilled. The day before your appointment, write a list of all prescriptions that need to be refilled. Bring that list to your appointment and give it to the admitting nurse. Prescriptions will be written only during appointments. If you forget a medication, it will not be "Called in", "Faxed", or "electronically sent". You will need  to get another appointment to get these prescribed. 4. Prescription Accuracy: You are responsible for carefully inspecting your prescriptions before leaving our office. Have the discharge nurse carefully go over each prescription with you, before taking them home. Make sure that your name is accurately spelled, that your address is correct. Check the name and dose of your medication to make sure it is accurate. Check the number of pills, and the written instructions to make sure they are clear and accurate. Make sure that you are given enough medication to last until your next medication refill appointment. 5. Taking Medication: Take medication as prescribed. Never take more pills than instructed. Never take medication more frequently than prescribed. Taking less pills or less frequently is permitted and encouraged, when it comes to controlled substances (written prescriptions).  6. Inform other Doctors: Always inform, all of your healthcare providers, of all the medications you take. 7. Pain Medication from other Providers: You are not allowed to accept any additional pain medication from any other Doctor or Healthcare provider. There are two exceptions to this rule. (see below) In the event that you require additional pain medication, you are responsible for notifying us, as stated below. 8. Medication Agreement: You are responsible for carefully reading and following our  Medication Agreement. This must be signed before receiving any prescriptions from our practice. Safely store a copy of your signed Agreement. Violations to the Agreement will result in no further prescriptions. (Additional copies of our Medication Agreement are available upon request.) 9. Laws, Rules, & Regulations: All patients are expected to follow all Federal and [employer], [employer], Rules, [employer]. Ignorance of the Laws does not constitute a valid excuse. The use of any illegal substances is prohibited. 10. Adopted CDC guidelines & recommendations: Target dosing levels will be at or below 60 MME/day. Use of benzodiazepines** is not recommended.  Exceptions: There are only two exceptions to the rule of not receiving pain medications from other Healthcare Providers. 1. Exception #1 (Emergencies): In the event of an emergency (i.e.: accident requiring emergency care), you are allowed to receive additional pain medication. However, you are responsible for: As soon as you are able, call our office [phone], at any time of the day or night, and leave a message stating your name, the date and nature of the emergency, and the name and dose of the medication prescribed. In the event that your call is answered by a member of our staff, make sure to document and save the date, time, and the name of the person that took your information.  2. Exception #2 (Planned Surgery): In the event that you are scheduled by another doctor or dentist to have any type of surgery or procedure, you are allowed (for a period no longer than 30 days), to receive additional pain medication, for the acute post-op pain. However, in this case, you are responsible for picking up a copy of our "Post-op Pain Management for Surgeons" handout, and giving it to your surgeon or dentist. This document is available at our office, and does not require an appointment to obtain it. Simply go to our office during business hours  (Monday-Thursday from 8:00 AM to 4:00 PM) (Friday 8:00 AM to 12:00 Noon) or if you have a scheduled appointment with [place], prior to your surgery, and ask for it by name. In addition, you will need to provide [place] with your name, name of your surgeon, type of surgery, and date of procedure or surgery.  *Opioid medications include:  morphine, codeine, oxycodone, oxymorphone, hydrocodone, hydromorphone, meperidine, tramadol, tapentadol, buprenorphine, fentanyl, methadone. **Benzodiazepine medications include: diazepam (Valium), alprazolam (Xanax), clonazepam (Klonopine), lorazepam (Ativan), clorazepate (Tranxene), chlordiazepoxide (Librium), estazolam (Prosom), oxazepam (Serax), temazepam (Restoril), triazolam (Halcion)  A prescription for Gabapentin was sent to your pharmacy. You were given one prescription for Oxycodone today.  ____________________________________________________________________________________________

## 2017-02-23 NOTE — Patient Instructions (Addendum)
____________________________________________________________________________________________  Medication Rules  Applies to: All patients receiving prescriptions (written or electronic).  Pharmacy of record: Pharmacy where electronic prescriptions will be sent. If written prescriptions are taken to a different pharmacy, please inform the nursing staff. The pharmacy listed in the electronic medical record should be the one where you would like electronic prescriptions to be sent.  Prescription refills: Only during scheduled appointments. Applies to both, written and electronic prescriptions.  NOTE: The following applies primarily to controlled substances (Opioid* Pain Medications).   Patient's responsibilities: 1. Pain Pills: Bring all pain pills to every appointment (except for procedure appointments). 2. Pill Bottles: Bring pills in original pharmacy bottle. Always bring newest bottle. Bring bottle, even if empty. 3. Medication refills: You are responsible for knowing and keeping track of what medications you need refilled. The day before your appointment, write a list of all prescriptions that need to be refilled. Bring that list to your appointment and give it to the admitting nurse. Prescriptions will be written only during appointments. If you forget a medication, it will not be "Called in", "Faxed", or "electronically sent". You will need to get another appointment to get these prescribed. 4. Prescription Accuracy: You are responsible for carefully inspecting your prescriptions before leaving our office. Have the discharge nurse carefully go over each prescription with you, before taking them home. Make sure that your name is accurately spelled, that your address is correct. Check the name and dose of your medication to make sure it is accurate. Check the number of pills, and the written instructions to make sure they are clear and accurate. Make sure that you are given enough medication to  last until your next medication refill appointment. 5. Taking Medication: Take medication as prescribed. Never take more pills than instructed. Never take medication more frequently than prescribed. Taking less pills or less frequently is permitted and encouraged, when it comes to controlled substances (written prescriptions).  6. Inform other Doctors: Always inform, all of your healthcare providers, of all the medications you take. 7. Pain Medication from other Providers: You are not allowed to accept any additional pain medication from any other Doctor or Healthcare provider. There are two exceptions to this rule. (see below) In the event that you require additional pain medication, you are responsible for notifying us, as stated below. 8. Medication Agreement: You are responsible for carefully reading and following our Medication Agreement. This must be signed before receiving any prescriptions from our practice. Safely store a copy of your signed Agreement. Violations to the Agreement will result in no further prescriptions. (Additional copies of our Medication Agreement are available upon request.) 9. Laws, Rules, & Regulations: All patients are expected to follow all Federal and State Laws, Statutes, Rules, & Regulations. Ignorance of the Laws does not constitute a valid excuse. The use of any illegal substances is prohibited. 10. Adopted CDC guidelines & recommendations: Target dosing levels will be at or below 60 MME/day. Use of benzodiazepines** is not recommended.  Exceptions: There are only two exceptions to the rule of not receiving pain medications from other Healthcare Providers. 1. Exception #1 (Emergencies): In the event of an emergency (i.e.: accident requiring emergency care), you are allowed to receive additional pain medication. However, you are responsible for: As soon as you are able, call our office [phone], at any time of the day or night, and leave a message stating your  name, the date and nature of the emergency, and the name and dose of the medication   prescribed. In the event that your call is answered by a member of our staff, make sure to document and save the date, time, and the name of the person that took your information.  2. Exception #2 (Planned Surgery): In the event that you are scheduled by another doctor or dentist to have any type of surgery or procedure, you are allowed (for a period no longer than 30 days), to receive additional pain medication, for the acute post-op pain. However, in this case, you are responsible for picking up a copy of our "Post-op Pain Management for Surgeons" handout, and giving it to your surgeon or dentist. This document is available at our office, and does not require an appointment to obtain it. Simply go to our office during business hours (Monday-Thursday from 8:00 AM to 4:00 PM) (Friday 8:00 AM to 12:00 Noon) or if you have a scheduled appointment with us, prior to your surgery, and ask for it by name. In addition, you will need to provide us with your name, name of your surgeon, type of surgery, and date of procedure or surgery.  *Opioid medications include: morphine, codeine, oxycodone, oxymorphone, hydrocodone, hydromorphone, meperidine, tramadol, tapentadol, buprenorphine, fentanyl, methadone. **Benzodiazepine medications include: diazepam (Valium), alprazolam (Xanax), clonazepam (Klonopine), lorazepam (Ativan), clorazepate (Tranxene), chlordiazepoxide (Librium), estazolam (Prosom), oxazepam (Serax), temazepam (Restoril), triazolam (Halcion)  A prescription for Gabapentin was sent to your pharmacy. You were given one prescription for Oxycodone today.  ____________________________________________________________________________________________

## 2017-02-23 NOTE — Progress Notes (Signed)
Nursing Pain Medication Assessment:  Safety precautions to be maintained throughout the outpatient stay will include: orient to surroundings, keep bed in low position, maintain call bell within reach at all times, provide assistance with transfer out of bed and ambulation.  Medication Inspection Compliance: Pill count conducted under aseptic conditions, in front of the patient. Neither the pills nor the bottle was removed from the patient's sight at any time. Once count was completed pills were immediately returned to the patient in their original bottle.  Medication: Oxycodone IR Pill/Patch Count: 15 of 120 pills remain Pill/Patch Appearance: Markings consistent with prescribed medication Bottle Appearance: Standard pharmacy container. Clearly labeled. Filled Date: 6912 / 29 / 2018 Last Medication intake:  Today

## 2017-03-01 ENCOUNTER — Telehealth: Payer: Self-pay | Admitting: *Deleted

## 2017-03-01 NOTE — Telephone Encounter (Signed)
noted 

## 2017-03-23 ENCOUNTER — Encounter: Payer: Self-pay | Admitting: Nurse Practitioner

## 2017-03-23 ENCOUNTER — Ambulatory Visit: Payer: Medicare Other | Attending: Nurse Practitioner | Admitting: Nurse Practitioner

## 2017-03-23 ENCOUNTER — Other Ambulatory Visit: Payer: Self-pay

## 2017-03-23 ENCOUNTER — Encounter: Payer: Medicare Other | Admitting: Nurse Practitioner

## 2017-03-23 VITALS — BP 194/107 | HR 50 | Temp 98.6°F | Resp 16 | Ht 64.0 in | Wt 140.0 lb

## 2017-03-23 DIAGNOSIS — G950 Syringomyelia and syringobulbia: Secondary | ICD-10-CM | POA: Insufficient documentation

## 2017-03-23 DIAGNOSIS — G629 Polyneuropathy, unspecified: Secondary | ICD-10-CM | POA: Diagnosis not present

## 2017-03-23 DIAGNOSIS — G894 Chronic pain syndrome: Secondary | ICD-10-CM | POA: Diagnosis not present

## 2017-03-23 DIAGNOSIS — Z79899 Other long term (current) drug therapy: Secondary | ICD-10-CM | POA: Insufficient documentation

## 2017-03-23 DIAGNOSIS — E860 Dehydration: Secondary | ICD-10-CM | POA: Diagnosis not present

## 2017-03-23 DIAGNOSIS — Z791 Long term (current) use of non-steroidal anti-inflammatories (NSAID): Secondary | ICD-10-CM | POA: Insufficient documentation

## 2017-03-23 DIAGNOSIS — M5416 Radiculopathy, lumbar region: Secondary | ICD-10-CM

## 2017-03-23 DIAGNOSIS — Z7982 Long term (current) use of aspirin: Secondary | ICD-10-CM | POA: Diagnosis not present

## 2017-03-23 DIAGNOSIS — Z79891 Long term (current) use of opiate analgesic: Secondary | ICD-10-CM | POA: Diagnosis not present

## 2017-03-23 DIAGNOSIS — G600 Hereditary motor and sensory neuropathy: Secondary | ICD-10-CM | POA: Diagnosis not present

## 2017-03-23 DIAGNOSIS — M5116 Intervertebral disc disorders with radiculopathy, lumbar region: Secondary | ICD-10-CM | POA: Insufficient documentation

## 2017-03-23 DIAGNOSIS — E559 Vitamin D deficiency, unspecified: Secondary | ICD-10-CM | POA: Diagnosis not present

## 2017-03-23 DIAGNOSIS — F329 Major depressive disorder, single episode, unspecified: Secondary | ICD-10-CM | POA: Diagnosis not present

## 2017-03-23 DIAGNOSIS — Z880 Allergy status to penicillin: Secondary | ICD-10-CM | POA: Insufficient documentation

## 2017-03-23 DIAGNOSIS — F419 Anxiety disorder, unspecified: Secondary | ICD-10-CM | POA: Insufficient documentation

## 2017-03-23 DIAGNOSIS — Z5181 Encounter for therapeutic drug level monitoring: Secondary | ICD-10-CM | POA: Insufficient documentation

## 2017-03-23 DIAGNOSIS — I10 Essential (primary) hypertension: Secondary | ICD-10-CM | POA: Diagnosis not present

## 2017-03-23 DIAGNOSIS — E876 Hypokalemia: Secondary | ICD-10-CM | POA: Diagnosis not present

## 2017-03-23 DIAGNOSIS — F4323 Adjustment disorder with mixed anxiety and depressed mood: Secondary | ICD-10-CM | POA: Insufficient documentation

## 2017-03-23 MED ORDER — OXYCODONE HCL 5 MG PO TABS: 5.0000 mg | ORAL_TABLET | Freq: Four times a day (QID) | ORAL | 0 refills | Status: DC | PRN

## 2017-03-23 MED ORDER — GABAPENTIN 100 MG PO CAPS: 100.0000 mg | ORAL_CAPSULE | Freq: Four times a day (QID) | ORAL | 0 refills | Status: DC

## 2017-03-23 NOTE — Patient Instructions (Signed)

## 2017-03-23 NOTE — Progress Notes (Signed)
Patient's Name: Maria Wheeler  MRN: [id number]  Referring Provider: Ricardo Jericho*  DOB: 03/19/1955  PCP: Ricardo Jericho, NP  DOS: 03/23/2017  Note by: Vevelyn Francois NP  Service setting: Ambulatory outpatient  Specialty: Interventional Pain Management  Location: [facility] (AMB) Pain Management Facility    Patient type: Established    Primary Reason(s) for Visit: Encounter for prescription drug management. (Level of risk: moderate)  CC: Peripheral Neuropathy (r/t disease process)  HPI  Maria Wheeler is a 62 y.o. year old, female patient, who comes today for a medication management evaluation. She has HTN (hypertension), benign; Auditory hallucinations; History of drug dependence ([place]); Benzodiazepine dependence ([place]); Chronic anxiety; Diastolic dysfunction; Multiple falls; UTI (urinary tract infection); Hypokalemia; Depression with anxiety; Benzodiazepine abuse ([place]); Chronic musculoskeletal pain; CMT (Charcot-Marie-Tooth disease); H/O: stroke; SDH (subdural hematoma) ([place]); Disorder of skeletal system; Pharmacologic therapy; Problems influencing health status; Chronic neck pain (Tertiary Area of Pain) (Posterior) (Bilateral) (L>R); DDD (degenerative disc disease), cervical; Cervical spondylosis; Chronic shoulder pain (Right); Chronic upper extremity pain (Left); Chronic pain syndrome; Chronic knee pain (B) (L>R); Chronic lumbar radiculopathy (L5/S1) (Left); Chronic lower extremity pain (Primary Area of Pain) (Bilateral) (L>R); Cervical Grade 1 Retrolisthesis of C5 over C6; Abnormal nerve conduction studies; Chronic upper extremity polyneuropathy (severe) (CMT); 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[place]); Adjustment disorder with mixed anxiety and depressed mood; Chronic upper extremity pain (Secondary Area of Pain) (Bilateral) (L>R); DDD (degenerative disc disease), lumbar; Lumbar facet arthropathy (Bilateral); 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 Her primarily concern today is the Peripheral Neuropathy (r/t disease process)  Pain Assessment: Location:   Generalized Onset: More than a month ago Duration: Neuropathic pain Quality: Aching, Constant, Throbbing Severity: 5 /10 (self-reported pain score)  Note: Reported level is compatible with observation.                          Timing: Constant Modifying factors:    Maria Wheeler was last scheduled for an appointment on 02/23/2017 for medication management. During today's appointment we reviewed Maria Wheeler's chronic pain status, as well as her outpatient medication regimen. She states that she is not able to go to PT secondary not being able to drive. She is in need of PT secodnary to her conditoin but would like in home theraoy. She states that this was completed by [facility].   The patient  reports that she does not use drugs. Her body mass index is 24.03 kg/m.  Further details on both, my assessment(s), as well as the proposed treatment plan, please see below.  Controlled Substance Pharmacotherapy Assessment REMS (Risk Evaluation and Mitigation Strategy)  Analgesic:Oxycodone IR 5 mg 1 tablet by mouth every 6 hours when necessary (30m/dayof oxycodone) MME/day:36mday.   WeRise Patience2/20/2019 10:41 AM  Sign at close encounter Nursing Pain Medication Assessment:  Safety precautions to be maintained throughout the outpatient stay will include: orient to surroundings, keep bed in low position, maintain call bell within reach at all times, provide assistance with transfer out of bed and ambulation.  Medication Inspection Compliance: Pill count conducted under aseptic conditions, in front of the  patient. Neither the pills nor the bottle was removed from the patient's sight at any time. Once count was completed  pills were immediately returned to the patient in their original bottle.  Medication: Oxycodone IR Pill/Patch Count: 24 of 120 pills remain Pill/Patch Appearance: Markings consistent with prescribed medication Bottle Appearance: Standard pharmacy container. Clearly labeled. Filled Date: 1 / 29 / 2019 Last Medication intake:  Today   Pharmacokinetics: Liberation and absorption (onset of action): WNL Distribution (time to peak effect): WNL Metabolism and excretion (duration of action): WNL         Pharmacodynamics: Desired effects: Analgesia: Ms. Manolis reports >50% benefit. Functional ability: Patient reports that medication allows her to accomplish basic ADLs Clinically meaningful improvement in function (CMIF): Sustained CMIF goals met Perceived effectiveness: Described as relatively effective, allowing for increase in activities of daily living (ADL) Undesirable effects: Side-effects or Adverse reactions: None reported Monitoring: [place] PMP: Online review of the past 21-monthperiod conducted. Compliant with practice rules and regulations Last UDS on record: Summary  Date Value Ref Range Status  11/10/2016 FINAL  Final    Comment:    ==================================================================== TOXASSURE COMP DRUG ANALYSIS,UR ==================================================================== Test                             Result       Flag       Units Drug Present and Declared for Prescription Verification   Oxycodone                      802          EXPECTED   ng/mg creat   Oxymorphone                    350          EXPECTED   ng/mg creat   Noroxycodone                   578          EXPECTED   ng/mg creat   Noroxymorphone                 93           EXPECTED   ng/mg creat    Sources of oxycodone are scheduled prescription medications.     Oxymorphone, noroxycodone, and noroxymorphone are expected    metabolites of oxycodone. Oxymorphone is also available as a    scheduled prescription medication.   Acetaminophen                  PRESENT      EXPECTED   Atenolol                       PRESENT      EXPECTED Drug Present not Declared for Prescription Verification   Lorazepam                      953          UNEXPECTED ng/mg creat    Source of lorazepam is a scheduled prescription medication.   Trazodone                      PRESENT      UNEXPECTED   Quetiapine                     PRESENT      UNEXPECTED   Ibuprofen  PRESENT      UNEXPECTED ==================================================================== Test                      Result    Flag   Units      Ref Range   Creatinine              171              mg/dL      >=20 ==================================================================== Declared Medications:  The flagging and interpretation on this report are based on the  following declared medications.  Unexpected results may arise from  inaccuracies in the declared medications.  **Note: The testing scope of this panel includes these medications:  Atenolol  Oxycodone (Oxycodone Acetaminophen)  **Note: The testing scope of this panel does not include small to  moderate amounts of these reported medications:  Acetaminophen (Oxycodone Acetaminophen)  **Note: The testing scope of this panel does not include following  reported medications:  Lisinopril ==================================================================== For clinical consultation, please call [phone]. ====================================================================    UDS interpretation: Compliant          Medication Assessment Form: Reviewed. Patient indicates being compliant with therapy Treatment compliance: Compliant Risk Assessment Profile: Aberrant behavior: See prior evaluations. None observed or detected  today Comorbid factors increasing risk of overdose: See prior notes. No additional risks detected today Risk of substance use disorder (SUD): Low Opioid Risk Tool - 03/23/17 1034      Family History of Substance Abuse   Alcohol  Negative    Illegal Drugs  Negative    Rx Drugs  Negative      Personal History of Substance Abuse   Alcohol  Negative    Illegal Drugs  Negative    Rx Drugs  Negative      Age   Age between 2-45 years   No      Psychological Disease   Psychological Disease  Negative    Depression  Negative      Total Score   Opioid Risk Tool Scoring  0    Opioid Risk Interpretation  Low Risk      ORT Scoring interpretation table:  Score <3 = Low Risk for SUD  Score between 4-7 = Moderate Risk for SUD  Score >8 = High Risk for Opioid Abuse   Risk Mitigation Strategies:  Patient Counseling: Covered Patient-Prescriber Agreement (PPA): Present and active  Notification to other healthcare providers: Done  Pharmacologic Plan: No change in therapy, at this time.             Laboratory Chemistry  Inflammation Markers (CRP: Acute Phase) (ESR: Chronic Phase) Lab Results  Component Value Date   CRP 1.2 11/10/2016   ESRSEDRATE 5 11/10/2016                         Rheumatology Markers No results found for: RF, ANA, [facility], LYMEIGGIGMAB, [facility]              Renal Function Markers Lab Results  Component Value Date   BUN 18 11/10/2016   CREATININE 0.87 11/10/2016   GFRAA 83 11/10/2016   GFRNONAA 72 11/10/2016                 Hepatic Function Markers Lab Results  Component Value Date   AST 11 11/10/2016   ALT 13 (L) 12/31/2015   ALBUMIN 4.2 11/10/2016   ALKPHOS 81 11/10/2016   LIPASE 33 07/16/2014  Electrolytes Lab Results  Component Value Date   NA 141 11/10/2016   K 3.8 11/10/2016   CL 100 11/10/2016   CALCIUM 9.3 11/10/2016   MG 2.0 11/10/2016                        Neuropathy Markers Lab Results  Component  Value Date   VITAMINB12 615 11/10/2016                 Bone Pathology Markers Lab Results  Component Value Date   VD25OH 13.5 (L) 12/31/2015   25OHVITD1 13 (L) 11/10/2016   25OHVITD2 <1.0 11/10/2016   25OHVITD3 13 11/10/2016                         Coagulation Parameters Lab Results  Component Value Date   INR 1.0 12/30/2013   LABPROT 13.2 12/30/2013   APTT 28.9 12/30/2013   PLT 214 12/31/2015                 Cardiovascular Markers Lab Results  Component Value Date   CKTOTAL 36 (L) 12/31/2015   TROPONINI <0.03 12/31/2015   HGB 13.5 12/31/2015   HCT 39.3 12/31/2015                 CA Markers No results found for: CEA, CA125, LABCA2               Note: Lab results reviewed.  Recent Diagnostic Imaging Results  DG C-Arm 1-60 Min-No Report Fluoroscopy was utilized by the requesting physician.  No radiographic  interpretation.   Complexity Note: Imaging results reviewed. Results shared with Maria Wheeler, using Layman's terms.                         Meds   Current Outpatient Medications:  .  aspirin EC 81 MG tablet, Take 81 mg by mouth daily., Disp: , Rfl:  .  atenolol (TENORMIN) 100 MG tablet, Take by mouth daily. , Disp: , Rfl:  .  gabapentin (NEURONTIN) 100 MG capsule, Take 1-3 capsules (100-300 mg total) by mouth 4 (four) times daily. Follow written titration schedule., Disp: 120 capsule, Rfl: 0 .  ibuprofen (ADVIL,MOTRIN) 200 MG tablet, Take 200 mg by mouth every 6 (six) hours as needed., Disp: , Rfl:  .  lisinopril (PRINIVIL,ZESTRIL) 10 MG tablet, Take 10 mg by mouth. , Disp: , Rfl:  .  [START ON 04/30/2017] oxyCODONE (OXY IR/ROXICODONE) 5 MG immediate release tablet, Take 1 tablet (5 mg total) by mouth every 6 (six) hours as needed for severe pain., Disp: 120 tablet, Rfl: 0 .  Calcium Carb-Cholecalciferol (CALCIUM PLUS D3 ABSORBABLE) [phone] MG-UNIT CAPS, Take 1 capsule by mouth daily with breakfast., Disp: 90 capsule, Rfl: 0 .  [START ON 03/31/2017] oxyCODONE (OXY  IR/ROXICODONE) 5 MG immediate release tablet, Take 1 tablet (5 mg total) by mouth every 6 (six) hours as needed for severe pain., Disp: 120 tablet, Rfl: 0  ROS  Constitutional: Denies any fever or chills Gastrointestinal: No reported hemesis, hematochezia, vomiting, or acute GI distress Musculoskeletal: Denies any acute onset joint swelling, redness, loss of ROM, or weakness Neurological: No reported episodes of acute onset apraxia, aphasia, dysarthria, agnosia, amnesia, paralysis, loss of coordination, or loss of consciousness  Allergies  Maria Wheeler is allergic to penicillins.  [place]  Drug: Maria Wheeler  reports that she does not use drugs. Alcohol:  reports that she  drinks about 0.6 oz of alcohol per week. Tobacco:  reports that  has never smoked. she has never used smokeless tobacco. Medical:  has a past medical history of Abdominal pain (12/22/2012), Abnormal EKG (12/11/2011), Altered mental status (12/11/2011), Anxiety, Benzodiazepine dependence ([place]) (12/14/2011), Charcot-Marie disease, Chronic anxiety (12/14/2011), Dehydration (02/07/4942), Diastolic dysfunction (96/75/9163), Drug withdrawal ([place]) (12/12/2011), Hypertension, Hypokalemia (12/11/2011), and Psychosis ([place]) (12/12/2011). Surgical: Ms. Pickney  has a past surgical history that includes Abdominal hysterectomy. Family: family history includes Charcot-Marie-Tooth disease in her mother and son; Heart disease in her father.  Constitutional Exam  General appearance: Well nourished, well developed, and well hydrated. In no apparent acute distress Vitals:   03/23/17 1026  BP: (!) 194/107  Pulse: (!) 50  Resp: 16  Temp: 98.6 F (37 C)  TempSrc: Oral  SpO2: 100%  Weight: 140 lb (63.5 kg)  Height: _0  (1.626 m)  Psych/Mental status: Alert, oriented x 3 (person, place, & time)       Eyes: PERLA Respiratory: No evidence of acute respiratory distress  Cervical Spine Area Exam  Skin & Axial Inspection: No masses, redness, edema,  swelling, or associated skin lesions Alignment: Symmetrical Functional ROM: Unrestricted ROM      Stability: No instability detected Muscle Tone/Strength: Functionally intact. No obvious neuro-muscular anomalies detected. Sensory (Neurological): Unimpaired Palpation: No palpable anomalies              Upper Extremity (UE) Exam    Side: Right upper extremity  Side: Left upper extremity  Skin & Extremity Inspection: Skin color, temperature, and hair growth are WNL. No peripheral edema or cyanosis. No masses, redness, swelling, asymmetry, or associated skin lesions. No contractures.  Skin & Extremity Inspection: Skin color, temperature, and hair growth are WNL. No peripheral edema or cyanosis. No masses, redness, swelling, asymmetry, or associated skin lesions. No contractures.  Functional ROM: Unrestricted ROM          Functional ROM: Unrestricted ROM          Muscle Tone/Strength: Functionally intact. No obvious neuro-muscular anomalies detected.  Muscle Tone/Strength: Functionally intact. No obvious neuro-muscular anomalies detected.  Sensory (Neurological): Unimpaired          Sensory (Neurological): Unimpaired          Palpation: No palpable anomalies              Palpation: No palpable anomalies              Specialized Test(s): Deferred         Specialized Test(s): Deferred          Thoracic Spine Area Exam  Skin & Axial Inspection: No masses, redness, or swelling Alignment: Symmetrical Functional ROM: Unrestricted ROM Stability: No instability detected Muscle Tone/Strength: Functionally intact. No obvious neuro-muscular anomalies detected. Sensory (Neurological): Unimpaired Muscle strength & Tone: No palpable anomalies  Lumbar Spine Area Exam  Skin & Axial Inspection: No masses, redness, or swelling Alignment: Symmetrical Functional ROM: Unrestricted ROM      Stability: No instability detected Muscle Tone/Strength: Functionally intact. No obvious neuro-muscular anomalies  detected. Sensory (Neurological): Unimpaired Palpation: No palpable anomalies       Provocative Tests: Lumbar Hyperextension and rotation test: evaluation deferred today       Lumbar Lateral bending test: evaluation deferred today       Patrick's Maneuver: evaluation deferred today                    Gait & Posture Assessment  Ambulation:  Unassisted Gait: Relatively normal for age and body habitus Posture: WNL   Lower Extremity Exam    Side: Right lower extremity  Side: Left lower extremity  Skin & Extremity Inspection: Skin color, temperature, and hair growth are WNL. No peripheral edema or cyanosis. No masses, redness, swelling, asymmetry, or associated skin lesions. No contractures.  Skin & Extremity Inspection: Skin color, temperature, and hair growth are WNL. No peripheral edema or cyanosis. No masses, redness, swelling, asymmetry, or associated skin lesions. No contractures.  Functional ROM: Unrestricted ROM          Functional ROM: Unrestricted ROM          Muscle Tone/Strength: Functionally intact. No obvious neuro-muscular anomalies detected.  Muscle Tone/Strength: Functionally intact. No obvious neuro-muscular anomalies detected.  Sensory (Neurological): Unimpaired  Sensory (Neurological): Unimpaired  Palpation: No palpable anomalies  Palpation: No palpable anomalies   Assessment  Primary Diagnosis & Pertinent Problem List: The primary encounter diagnosis was CMT (Charcot-Marie-Tooth disease). Diagnoses of Chronic lumbar radiculopathy (L5/S1) (Left), Chronic upper extremity polyneuropathy (severe) (CMT), Chronic pain syndrome, and Long term prescription opiate use were also pertinent to this visit.  Status Diagnosis  Controlled Controlled Controlled 1. CMT (Charcot-Marie-Tooth disease)   2. Chronic lumbar radiculopathy (L5/S1) (Left)   3. Chronic upper extremity polyneuropathy (severe) (CMT)   4. Chronic pain syndrome   5. Long term prescription opiate use     Problems  updated and reviewed during this visit: No problems updated. Plan of Care  Pharmacotherapy (Medications Ordered): Meds ordered this encounter  Medications  . oxyCODONE (OXY IR/ROXICODONE) 5 MG immediate release tablet    Sig: Take 1 tablet (5 mg total) by mouth every 6 (six) hours as needed for severe pain.    Dispense:  120 tablet    Refill:  0    Do not place this medication, or any other prescription from our practice, on "Automatic Refill". Patient may have prescription filled one day early if pharmacy is closed on scheduled refill date. Do not fill until: 04/30/2017 To last until: 05/30/2017    Order Specific Question:   Supervising Provider    Answer:   Milinda Pointer [phone]  . oxyCODONE (OXY IR/ROXICODONE) 5 MG immediate release tablet    Sig: Take 1 tablet (5 mg total) by mouth every 6 (six) hours as needed for severe pain.    Dispense:  120 tablet    Refill:  0    Do not place this medication, or any other prescription from our practice, on "Automatic Refill". Patient may have prescription filled one day early if pharmacy is closed on scheduled refill date. Do not fill until: 03/31/2017 To last until:04/30/2017    Order Specific Question:   Supervising Provider    Answer:   Milinda Pointer [phone]  . gabapentin (NEURONTIN) 100 MG capsule    Sig: Take 1-3 capsules (100-300 mg total) by mouth 4 (four) times daily. Follow written titration schedule.    Dispense:  120 capsule    Refill:  0    Do not place medication on "Automatic Refill". Fill one day early if pharmacy is closed on scheduled refill date.    Order Specific Question:   Supervising Provider    Answer:   Milinda Pointer [phone]   New Prescriptions   OXYCODONE (OXY IR/ROXICODONE) 5 MG IMMEDIATE RELEASE TABLET    Take 1 tablet (5 mg total) by mouth every 6 (six) hours as needed for severe pain.   Medications administered today: Maria Wheeler "  Becky" had no medications administered during this  visit. Lab-work, procedure(s), and/or referral(s): Orders Placed This Encounter  Procedures  . ToxASSURE Select 13 (MW), Urine  . Ambulatory referral to Physical Therapy   Imaging and/or referral(s): AMB REFERRAL TO PHYSICAL THERAPY  Interventional management options: Planned, scheduled, and/or pending: None at this time.   Considering: Diagnostic right-sided L5-S1 lumbar epidural steroid injection Diagnostic bilateral lumbar facet block Possiblebilateral lumbar facet RFA Diagnostic left sided cervical epidural steroid injection  Diagnostic bilateral cervical facet block Possible bilateral cervical facet RFA Diagnostic right shoulder injection Diagnostic right suprascapular nerve block Possible right sided suprascapular nerve RFA   Palliative PRN treatment(s): Diagnostic left sided cervical epidural steroid injection      Provider-requested follow-up: Return in about 2 months (around 05/21/2017) for MedMgmt with Me Dionisio David).  Future Appointments  Date Time Provider [place]  05/18/2017 12:45 PM Vevelyn Francois, NP [facility] None   Primary Care Physician: Ricardo Jericho, NP Location: [facility] Note by: Vevelyn Francois NP Date: 03/23/2017; Time: 1:53 PM  Pain Score Disclaimer: We use the NRS-11 scale. This is a self-reported, subjective measurement of pain severity with only modest accuracy. It is used primarily to identify changes within a particular patient. It must be understood that outpatient pain scales are significantly less accurate that those used for research, where they can be applied under ideal controlled circumstances with minimal exposure to variables. In reality, the score is likely to be a combination of pain intensity and pain affect, where pain affect describes the degree of emotional arousal or changes in action readiness caused by the sensory experience of pain. Factors such as social  and work situation, setting, emotional state, anxiety levels, expectation, and prior pain experience may influence pain perception and show large inter-individual differences that may also be affected by time variables.  Patient instructions provided during this appointment: Patient Instructions   ____________________________________________________________________________________________  Medication Rules  Applies to: All patients receiving prescriptions (written or electronic).  Pharmacy of record: Pharmacy where electronic prescriptions will be sent. If written prescriptions are taken to a different pharmacy, please inform the nursing staff. The pharmacy listed in the electronic medical record should be the one where you would like electronic prescriptions to be sent.  Prescription refills: Only during scheduled appointments. Applies to both, written and electronic prescriptions.  NOTE: The following applies primarily to controlled substances (Opioid* Pain Medications).   Patient's responsibilities: 1. Pain Pills: Bring all pain pills to every appointment (except for procedure appointments). 2. Pill Bottles: Bring pills in original pharmacy bottle. Always bring newest bottle. Bring bottle, even if empty. 3. Medication refills: You are responsible for knowing and keeping track of what medications you need refilled. The day before your appointment, write a list of all prescriptions that need to be refilled. Bring that list to your appointment and give it to the admitting nurse. Prescriptions will be written only during appointments. If you forget a medication, it will not be "Called in", "Faxed", or "electronically sent". You will need to get another appointment to get these prescribed. 4. Prescription Accuracy: You are responsible for carefully inspecting your prescriptions before leaving our office. Have the discharge nurse carefully go over each prescription with you, before taking them  home. Make sure that your name is accurately spelled, that your address is correct. Check the name and dose of your medication to make sure it is accurate. Check the number of pills, and the written instructions to  make sure they are clear and accurate. Make sure that you are given enough medication to last until your next medication refill appointment. 5. Taking Medication: Take medication as prescribed. Never take more pills than instructed. Never take medication more frequently than prescribed. Taking less pills or less frequently is permitted and encouraged, when it comes to controlled substances (written prescriptions).  6. Inform other Doctors: Always inform, all of your healthcare providers, of all the medications you take. 7. Pain Medication from other Providers: You are not allowed to accept any additional pain medication from any other Doctor or Healthcare provider. There are two exceptions to this rule. (see below) In the event that you require additional pain medication, you are responsible for notifying us, as stated below. 8. Medication Agreement: You are responsible for carefully reading and following our Medication Agreement. This must be signed before receiving any prescriptions from our practice. Safely store a copy of your signed Agreement. Violations to the Agreement will result in no further prescriptions. (Additional copies of our Medication Agreement are available upon request.) 9. Laws, Rules, & Regulations: All patients are expected to follow all Federal and [employer], [employer], Rules, [employer]. Ignorance of the Laws does not constitute a valid excuse. The use of any illegal substances is prohibited. 10. Adopted CDC guidelines & recommendations: Target dosing levels will be at or below 60 MME/day. Use of benzodiazepines** is not recommended.  Exceptions: There are only two exceptions to the rule of not receiving pain medications from other Healthcare Providers. 1. Exception #1  (Emergencies): In the event of an emergency (i.e.: accident requiring emergency care), you are allowed to receive additional pain medication. However, you are responsible for: As soon as you are able, call our office [phone], at any time of the day or night, and leave a message stating your name, the date and nature of the emergency, and the name and dose of the medication prescribed. In the event that your call is answered by a member of our staff, make sure to document and save the date, time, and the name of the person that took your information.  2. Exception #2 (Planned Surgery): In the event that you are scheduled by another doctor or dentist to have any type of surgery or procedure, you are allowed (for a period no longer than 30 days), to receive additional pain medication, for the acute post-op pain. However, in this case, you are responsible for picking up a copy of our "Post-op Pain Management for Surgeons" handout, and giving it to your surgeon or dentist. This document is available at our office, and does not require an appointment to obtain it. Simply go to our office during business hours (Monday-Thursday from 8:00 AM to 4:00 PM) (Friday 8:00 AM to 12:00 Noon) or if you have a scheduled appointment with [place], prior to your surgery, and ask for it by name. In addition, you will need to provide [place] with your name, name of your surgeon, type of surgery, and date of procedure or surgery.  *Opioid medications include: morphine, codeine, oxycodone, oxymorphone, hydrocodone, hydromorphone, meperidine, tramadol, tapentadol, buprenorphine, fentanyl, methadone. **Benzodiazepine medications include: diazepam (Valium), alprazolam (Xanax), clonazepam (Klonopine), lorazepam (Ativan), clorazepate (Tranxene), chlordiazepoxide (Librium), estazolam (Prosom), oxazepam (Serax), temazepam (Restoril), triazolam  (Halcion)  ____________________________________________________________________________________________

## 2017-03-23 NOTE — Progress Notes (Signed)
Nursing Pain Medication Assessment:  Safety precautions to be maintained throughout the outpatient stay will include: orient to surroundings, keep bed in low position, maintain call bell within reach at all times, provide assistance with transfer out of bed and ambulation.  Medication Inspection Compliance: Pill count conducted under aseptic conditions, in front of the patient. Neither the pills nor the bottle was removed from the patient's sight at any time. Once count was completed pills were immediately returned to the patient in their original bottle.  Medication: Oxycodone IR Pill/Patch Count: 24 of 120 pills remain Pill/Patch Appearance: Markings consistent with prescribed medication Bottle Appearance: Standard pharmacy container. Clearly labeled. Filled Date: 1 / 29 / 2019 Last Medication intake:  Today

## 2017-03-24 ENCOUNTER — Encounter: Payer: Medicare Other | Admitting: Nurse Practitioner

## 2017-03-26 DIAGNOSIS — G629 Polyneuropathy, unspecified: Secondary | ICD-10-CM | POA: Diagnosis not present

## 2017-03-26 DIAGNOSIS — G600 Hereditary motor and sensory neuropathy: Secondary | ICD-10-CM | POA: Diagnosis not present

## 2017-03-29 LAB — TOXASSURE SELECT 13 (MW), URINE

## 2017-03-30 ENCOUNTER — Telehealth: Payer: Self-pay | Admitting: Nurse Practitioner

## 2017-03-30 NOTE — Telephone Encounter (Signed)
Maria Wheeler with  [facility] does not want to take home PT. He tried several times to call patient and left vmails. Got thru to patient today and she told him she did not want to have PT and for him not to call back.

## 2017-04-20 ENCOUNTER — Telehealth: Payer: Self-pay | Admitting: *Deleted

## 2017-04-20 NOTE — Telephone Encounter (Signed)
Jody from [facility] called about patient's pain medication and Rx dates being wrong.  After checking with Crystal on the fill date of March 30 she states that this date is correct from the previous date of March 28 and wants to leave the fill dates as is. Jody called to let him know this information.

## 2017-05-18 ENCOUNTER — Encounter: Payer: Medicare Other | Admitting: Nurse Practitioner

## 2017-05-25 ENCOUNTER — Ambulatory Visit: Payer: Medicare Other | Admitting: Nurse Practitioner

## 2017-05-31 ENCOUNTER — Other Ambulatory Visit: Payer: Self-pay

## 2017-05-31 ENCOUNTER — Encounter: Payer: Self-pay | Admitting: Nurse Practitioner

## 2017-05-31 ENCOUNTER — Ambulatory Visit: Payer: Medicare Other | Attending: Nurse Practitioner | Admitting: Nurse Practitioner

## 2017-05-31 VITALS — BP 209/116 | HR 58 | Temp 98.5°F | Resp 18 | Ht 64.0 in | Wt 135.0 lb

## 2017-05-31 DIAGNOSIS — G8929 Other chronic pain: Secondary | ICD-10-CM

## 2017-05-31 DIAGNOSIS — G629 Polyneuropathy, unspecified: Secondary | ICD-10-CM | POA: Diagnosis not present

## 2017-05-31 DIAGNOSIS — Z79899 Other long term (current) drug therapy: Secondary | ICD-10-CM | POA: Insufficient documentation

## 2017-05-31 DIAGNOSIS — Z79891 Long term (current) use of opiate analgesic: Secondary | ICD-10-CM | POA: Insufficient documentation

## 2017-05-31 DIAGNOSIS — Z7982 Long term (current) use of aspirin: Secondary | ICD-10-CM | POA: Insufficient documentation

## 2017-05-31 DIAGNOSIS — M79602 Pain in left arm: Secondary | ICD-10-CM | POA: Diagnosis not present

## 2017-05-31 DIAGNOSIS — G600 Hereditary motor and sensory neuropathy: Secondary | ICD-10-CM | POA: Insufficient documentation

## 2017-05-31 DIAGNOSIS — G950 Syringomyelia and syringobulbia: Secondary | ICD-10-CM | POA: Insufficient documentation

## 2017-05-31 DIAGNOSIS — G894 Chronic pain syndrome: Secondary | ICD-10-CM | POA: Diagnosis not present

## 2017-05-31 DIAGNOSIS — I10 Essential (primary) hypertension: Secondary | ICD-10-CM | POA: Insufficient documentation

## 2017-05-31 DIAGNOSIS — M79605 Pain in left leg: Secondary | ICD-10-CM

## 2017-05-31 DIAGNOSIS — F329 Major depressive disorder, single episode, unspecified: Secondary | ICD-10-CM | POA: Insufficient documentation

## 2017-05-31 DIAGNOSIS — M79604 Pain in right leg: Secondary | ICD-10-CM | POA: Diagnosis not present

## 2017-05-31 DIAGNOSIS — E559 Vitamin D deficiency, unspecified: Secondary | ICD-10-CM | POA: Diagnosis not present

## 2017-05-31 DIAGNOSIS — Z880 Allergy status to penicillin: Secondary | ICD-10-CM | POA: Diagnosis not present

## 2017-05-31 DIAGNOSIS — M79601 Pain in right arm: Secondary | ICD-10-CM

## 2017-05-31 DIAGNOSIS — Z5181 Encounter for therapeutic drug level monitoring: Secondary | ICD-10-CM | POA: Insufficient documentation

## 2017-05-31 DIAGNOSIS — Z791 Long term (current) use of non-steroidal anti-inflammatories (NSAID): Secondary | ICD-10-CM | POA: Diagnosis not present

## 2017-05-31 DIAGNOSIS — M5116 Intervertebral disc disorders with radiculopathy, lumbar region: Secondary | ICD-10-CM | POA: Diagnosis not present

## 2017-05-31 MED ORDER — GABAPENTIN 100 MG PO CAPS: 100.0000 mg | ORAL_CAPSULE | Freq: Four times a day (QID) | ORAL | 2 refills | Status: DC

## 2017-05-31 MED ORDER — OXYCODONE HCL 5 MG PO TABS
5.0000 mg | ORAL_TABLET | Freq: Four times a day (QID) | ORAL | 0 refills | Status: DC | PRN
Start: 2017-07-30 — End: 2017-08-30

## 2017-05-31 MED ORDER — OXYCODONE HCL 5 MG PO TABS: 5.0000 mg | ORAL_TABLET | Freq: Four times a day (QID) | ORAL | 0 refills | Status: DC | PRN

## 2017-05-31 MED ORDER — OXYCODONE HCL 5 MG PO TABS
5.0000 mg | ORAL_TABLET | Freq: Four times a day (QID) | ORAL | 0 refills | Status: DC | PRN
Start: 2017-05-31 — End: 2017-05-31

## 2017-05-31 NOTE — Progress Notes (Signed)
Patient's Name: Maria Wheeler  MRN: [id number]  Referring Provider: Ricardo Jericho*  DOB: 07-17-1955  PCP: Ricardo Jericho, NP  DOS: 05/31/2017  Note by: Vevelyn Francois NP  Service setting: Ambulatory outpatient  Specialty: Interventional Pain Management  Location: [facility] (AMB) Pain Management Facility    Patient type: Established    Primary Reason(s) for Visit: Encounter for prescription drug management. (Level of risk: moderate)  CC: Leg Pain (bilateral) and Foot Pain (bilateral)  HPI  Maria Wheeler is a 62 y.o. year old, female patient, who comes today for a medication management evaluation. She has HTN (hypertension), benign; Auditory hallucinations; History of drug dependence ([place]); Benzodiazepine dependence ([place]); Chronic anxiety; Diastolic dysfunction; Multiple falls; UTI (urinary tract infection); Hypokalemia; Depression with anxiety; Benzodiazepine abuse ([place]); Chronic musculoskeletal pain; CMT (Charcot-Marie-Tooth disease); H/O: stroke; SDH (subdural hematoma) ([place]); Disorder of skeletal system; Pharmacologic therapy; Problems influencing health status; Chronic neck pain (Tertiary Area of Pain) (Posterior) (Bilateral) (L>R); DDD (degenerative disc disease), cervical; Cervical spondylosis; Chronic shoulder pain (Right); Chronic upper extremity pain (Left); Chronic pain syndrome; Chronic knee pain (B) (L>R); Chronic lumbar radiculopathy (L5/S1) (Left); Chronic lower extremity pain (Primary Area of Pain) (Bilateral) (L>R); Cervical Grade 1 Retrolisthesis of C5 over C6; Abnormal nerve conduction studies; Chronic upper extremity polyneuropathy (severe) (CMT); 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[place]); Adjustment disorder with mixed anxiety and depressed mood; Chronic upper extremity pain (Secondary Area of Pain) (Bilateral) (L>R); DDD (degenerative disc disease), lumbar; Lumbar facet arthropathy (Bilateral); 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 Her primarily concern today is the Leg Pain (bilateral) and Foot Pain (bilateral)  Pain Assessment: Location: Right, Left Leg Radiating: feet Onset: More than a month ago Duration: Chronic pain Quality: Constant(debilitating) Severity: 10-Worst pain ever/10 (self-reported pain score)  Note: Reported level is compatible with observation.                          Timing: Constant Modifying factors: medication  Maria Wheeler was last scheduled for an appointment on 03/30/2017 for medication management. During today's appointment we reviewed Maria Wheeler's chronic pain status, as well as her outpatient medication regimen. She admits that she continues to have weakness in her legs. She feels like walking causes her more pain. Today our concern is her hypertension. She admits that she is taking her medication as directed however she has not been seen by her PCP in 18 mon. She admits that there are some difference in opinioin and she amdits that she is not pleased with her current pharmacy. She admits that she needs someone that delivers.  She admits that her BP was taken at home by a nurse and it was fine.   The patient  reports that she does not use drugs. Her body mass index is 23.17 kg/m.  Further details on both, my assessment(s), as well as the proposed treatment plan, please see below.  Controlled Substance Pharmacotherapy Assessment REMS (Risk Evaluation and Mitigation Strategy)  Analgesic:Oxycodone IR 5 mg 1 tablet by mouth every 6 hours when necessary ('20mg'$ /dayof oxycodone) MME/day:'30mg'$ /day.    Hart Rochester, RN  05/31/2017 12:27 PM  Sign at close encounter Nursing Pain Medication Assessment:   Safety precautions to be maintained throughout the outpatient stay will include:  orient to surroundings, keep bed in low position, maintain call bell within reach at all times, provide assistance with transfer out of bed and ambulation.  Medication Inspection Compliance: Pill count conducted under aseptic conditions, in front of the patient. Neither the pills nor the bottle was removed from the patient's sight at any time. Once count was completed pills were immediately returned to the patient in their original bottle.  Medication: Oxycodone IR Pill/Patch Count: 0 of 120 pills remain Pill/Patch Appearance: Markings consistent with prescribed medication Bottle Appearance: Standard pharmacy container. Clearly labeled. Filled Date: 03 / 30 / 2019 Last Medication intake:  Today   Pharmacokinetics: Liberation and absorption (onset of action): WNL Distribution (time to peak effect): WNL Metabolism and excretion (duration of action): WNL         Pharmacodynamics: Desired effects: Analgesia: Maria Wheeler reports >50% benefit. Functional ability: Patient reports that medication allows her to accomplish basic ADLs Clinically meaningful improvement in function (CMIF): Sustained CMIF goals met Perceived effectiveness: Described as relatively effective, allowing for increase in activities of daily living (ADL) Undesirable effects: Side-effects or Adverse reactions: None reported Monitoring: [place] PMP: Online review of the past 62-monthperiod conducted. Compliant with practice rules and regulations Last UDS on record: Summary  Date Value Ref Range Status  03/23/2017 FINAL  Final    Comment:    ==================================================================== TOXASSURE SELECT 13 (MW) ==================================================================== Test                             Result       Flag       Units Drug Present and Declared for Prescription Verification   Oxycodone                       1665         EXPECTED   ng/mg creat   Oxymorphone                    4479         EXPECTED   ng/mg creat   Noroxycodone                   2363         EXPECTED   ng/mg creat   Noroxymorphone                 763          EXPECTED   ng/mg creat    Sources of oxycodone are scheduled prescription medications.    Oxymorphone, noroxycodone, and noroxymorphone are expected    metabolites of oxycodone. Oxymorphone is also available as a    scheduled prescription medication. Drug Present not Declared for Prescription Verification   Lorazepam                      1022         UNEXPECTED ng/mg creat    Source of lorazepam is a scheduled prescription medication. ==================================================================== Test                      Result    Flag   Units      Ref Range   Creatinine              156              mg/dL      >=20 ====================================================================  Declared Medications:  The flagging and interpretation on this report are based on the  following declared medications.  Unexpected results may arise from  inaccuracies in the declared medications.  **Note: The testing scope of this panel includes these medications:  Oxycodone  **Note: The testing scope of this panel does not include following  reported medications:  Aspirin  Atenolol  Calcium carbonate (Calcium carbonate/Vitamin D3)  Gabapentin  Ibuprofen  Lisinopril  Vitamin D3 (Calcium carbonate/Vitamin D3) ==================================================================== For clinical consultation, please call [phone]. ====================================================================    UDS interpretation: Compliant          Medication Assessment Form: Reviewed. Patient indicates being compliant with therapy Treatment compliance: Compliant Risk Assessment Profile: Aberrant behavior: See prior evaluations. None observed or detected today Comorbid factors  increasing risk of overdose: See prior notes. No additional risks detected today Risk of substance use disorder (SUD): Low Opioid Risk Tool - 05/31/17 1234      Family History of Substance Abuse   Alcohol  Negative    Illegal Drugs  Negative    Rx Drugs  Negative      Personal History of Substance Abuse   Alcohol  Negative    Illegal Drugs  Negative    Rx Drugs  Negative      Age   Age between 32-45 years   No      History of Preadolescent Sexual Abuse   History of Preadolescent Sexual Abuse  Negative or Female      Psychological Disease   Psychological Disease  Negative    Depression  Negative      Total Score   Opioid Risk Tool Scoring  0    Opioid Risk Interpretation  Low Risk      ORT Scoring interpretation table:  Score <3 = Low Risk for SUD  Score between 4-7 = Moderate Risk for SUD  Score >8 = High Risk for Opioid Abuse   Risk Mitigation Strategies:  Patient Counseling: Covered Patient-Prescriber Agreement (PPA): Present and active  Notification to other healthcare providers: Done  Pharmacologic Plan: No change in therapy, at this time.             Laboratory Chemistry  Inflammation Markers (CRP: Acute Phase) (ESR: Chronic Phase) Lab Results  Component Value Date   CRP 1.2 11/10/2016   ESRSEDRATE 5 11/10/2016                         Rheumatology Markers No results found for: RF, ANA, [facility], LYMEIGGIGMAB, [facility]                      Renal Function Markers Lab Results  Component Value Date   BUN 18 11/10/2016   CREATININE 0.87 11/10/2016   GFRAA 83 11/10/2016   GFRNONAA 72 11/10/2016                              Hepatic Function Markers Lab Results  Component Value Date   AST 11 11/10/2016   ALT 13 (L) 12/31/2015   ALBUMIN 4.2 11/10/2016   ALKPHOS 81 11/10/2016   LIPASE 33 07/16/2014                        Electrolytes Lab Results  Component Value Date   NA 141 11/10/2016   K 3.8 11/10/2016   CL 100 11/10/2016   CALCIUM  9.3 11/10/2016   MG 2.0 11/10/2016                        Neuropathy Markers Lab Results  Component Value Date   VITAMINB12 615 11/10/2016                        Bone Pathology Markers Lab Results  Component Value Date   VD25OH 13.5 (L) 12/31/2015   25OHVITD1 13 (L) 11/10/2016   25OHVITD2 <1.0 11/10/2016   25OHVITD3 13 11/10/2016                         Coagulation Parameters Lab Results  Component Value Date   INR 1.0 12/30/2013   LABPROT 13.2 12/30/2013   APTT 28.9 12/30/2013   PLT 214 12/31/2015                        Cardiovascular Markers Lab Results  Component Value Date   CKTOTAL 36 (L) 12/31/2015   TROPONINI <0.03 12/31/2015   HGB 13.5 12/31/2015   HCT 39.3 12/31/2015                         CA Markers No results found for: CEA, CA125, LABCA2                      Note: Lab results reviewed.  Recent Diagnostic Imaging Results  DG C-Arm 1-60 Min-No Report Fluoroscopy was utilized by the requesting physician.  No radiographic  interpretation.   Complexity Note: Imaging results reviewed. Results shared with Maria Wheeler, using Layman's terms.                         Meds   Current Outpatient Medications:  .  aspirin EC 81 MG tablet, Take 81 mg by mouth daily., Disp: , Rfl:  .  atenolol (TENORMIN) 100 MG tablet, Take by mouth daily. , Disp: , Rfl:  .  Calcium Carb-Cholecalciferol (CALCIUM PLUS D3 ABSORBABLE) [phone] MG-UNIT CAPS, Take 1 capsule by mouth daily with breakfast., Disp: 90 capsule, Rfl: 0 .  gabapentin (NEURONTIN) 100 MG capsule, Take 1-3 capsules (100-300 mg total) by mouth 4 (four) times daily. Follow written titration schedule., Disp: 120 capsule, Rfl: 2 .  ibuprofen (ADVIL,MOTRIN) 200 MG tablet, Take 200 mg by mouth every 6 (six) hours as needed., Disp: , Rfl:  .  lisinopril (PRINIVIL,ZESTRIL) 10 MG tablet, Take 10 mg by mouth. , Disp: , Rfl:  .  oxyCODONE (OXY IR/ROXICODONE) 5 MG immediate release tablet, Take 1 tablet (5 mg total) by  mouth every 6 (six) hours as needed for severe pain., Disp: 120 tablet, Rfl: 0 .  [START ON 06/30/2017] oxyCODONE (OXY IR/ROXICODONE) 5 MG immediate release tablet, Take 1 tablet (5 mg total) by mouth every 6 (six) hours as needed for severe pain., Disp: 120 tablet, Rfl: 0 .  [START ON 07/30/2017] oxyCODONE (OXY IR/ROXICODONE) 5 MG immediate release tablet, Take 1 tablet (5 mg total) by mouth every 6 (six) hours as needed for severe pain., Disp: 120 tablet, Rfl: 0  ROS  Constitutional: Denies any fever or chills Gastrointestinal: No reported hemesis, hematochezia, vomiting, or acute GI distress Musculoskeletal: Denies any acute onset joint swelling, redness, loss of ROM, or weakness Neurological: No reported episodes of acute onset apraxia, aphasia, dysarthria, agnosia, amnesia, paralysis,  loss of coordination, or loss of consciousness  Allergies  Maria Wheeler is allergic to penicillins.  Maria Wheeler  Drug: Maria Wheeler  reports that she does not use drugs. Alcohol:  reports that she drinks about 0.6 oz of alcohol per week. Tobacco:  reports that she has never smoked. She has never used smokeless tobacco. Medical:  has a past medical history of Abdominal pain (12/22/2012), Abnormal EKG (12/11/2011), Altered mental status (12/11/2011), Anxiety, Benzodiazepine dependence ([place]) (12/14/2011), Charcot-Marie disease, Chronic anxiety (12/14/2011), Dehydration (96/03/2295), Diastolic dysfunction (98/92/1194), Drug withdrawal ([place]) (12/12/2011), Hypertension, Hypokalemia (12/11/2011), and Psychosis ([place]) (12/12/2011). Surgical: Maria Wheeler  has a past surgical history that includes Abdominal hysterectomy. Family: family history includes Charcot-Marie-Tooth disease in her mother and son; Heart disease in her father.  Constitutional Exam  General appearance: Well nourished, well developed, and well hydrated. In no apparent acute distress Vitals:   05/31/17 1228  BP: (!) 209/116  Pulse: (!) 58  Resp: 18  Temp: 98.5 F  (36.9 C)  TempSrc: Oral  SpO2: 100%  Weight: 135 lb (61.2 kg)  Height: '5\' 4"'$  (1.626 m)   BMI Assessment: Estimated body mass index is 23.17 kg/m as calculated from the following:   Height as of this encounter: '5\' 4"'$  (1.626 m).   Weight as of this encounter: 135 lb (61.2 kg). Psych/Mental status: Alert, oriented x 3 (person, place, & time)       Eyes: PERLA Respiratory: No evidence of acute respiratory distress   Assessment  Primary Diagnosis & Pertinent Problem List: The primary encounter diagnosis was CMT (Charcot-Marie-Tooth disease). Diagnoses of Chronic pain syndrome, Chronic lower extremity pain (Primary Area of Pain) (Bilateral) (L>R), and Chronic upper extremity pain (Secondary Area of Pain) (Bilateral) (L>R) were also pertinent to this visit.  Status Diagnosis  Persistent Persistent Persistent 1. CMT (Charcot-Marie-Tooth disease)   2. Chronic pain syndrome   3. Chronic lower extremity pain (Primary Area of Pain) (Bilateral) (L>R)   4. Chronic upper extremity pain (Secondary Area of Pain) (Bilateral) (L>R)     Problems updated and reviewed during this visit: No problems updated. Plan of Care  Pharmacotherapy (Medications Ordered): Meds ordered this encounter  Medications  . oxyCODONE (OXY IR/ROXICODONE) 5 MG immediate release tablet    Sig: Take 1 tablet (5 mg total) by mouth every 6 (six) hours as needed for severe pain.    Dispense:  120 tablet    Refill:  0    Do not place this medication, or any other prescription from our practice, on "Automatic Refill". Patient may have prescription filled one day early if pharmacy is closed on scheduled refill date. Do not fill until: 05/31/2017 To last until:06/30/2017    Order Specific Question:   Supervising Provider    Answer:   Milinda Pointer [phone]  . gabapentin (NEURONTIN) 100 MG capsule    Sig: Take 1-3 capsules (100-300 mg total) by mouth 4 (four) times daily. Follow written titration schedule.    Dispense:  120  capsule    Refill:  2    Do not place medication on "Automatic Refill". Fill one day early if pharmacy is closed on scheduled refill date.    Order Specific Question:   Supervising Provider    Answer:   Milinda Pointer [phone]  . oxyCODONE (OXY IR/ROXICODONE) 5 MG immediate release tablet    Sig: Take 1 tablet (5 mg total) by mouth every 6 (six) hours as needed for severe pain.    Dispense:  120 tablet  Refill:  0    Do not place this medication, or any other prescription from our practice, on "Automatic Refill". Patient may have prescription filled one day early if pharmacy is closed on scheduled refill date. Do not fill until: 06/30/2017 To last until: 07/30/2017    Order Specific Question:   Supervising Provider    Answer:   Milinda Pointer [phone]  . oxyCODONE (OXY IR/ROXICODONE) 5 MG immediate release tablet    Sig: Take 1 tablet (5 mg total) by mouth every 6 (six) hours as needed for severe pain.    Dispense:  120 tablet    Refill:  0    Do not place this medication, or any other prescription from our practice, on "Automatic Refill". Patient may have prescription filled one day early if pharmacy is closed on scheduled refill date. Do not fill until: 07/30/2017 To last until:08/29/2017    Order Specific Question:   Supervising Provider    Answer:   Milinda Pointer [phone]   New Prescriptions   OXYCODONE (OXY IR/ROXICODONE) 5 MG IMMEDIATE RELEASE TABLET    Take 1 tablet (5 mg total) by mouth every 6 (six) hours as needed for severe pain.   Medications administered today: [employer]" had no medications administered during this visit. Lab-work, procedure(s), and/or referral(s): No orders of the defined types were placed in this encounter.  Imaging and/or referral(s): None  Interventional therapies: Planned, scheduled, and/or pending:   Not at this time. She got a refill when her pharmacy closed but there is not current record of a refill at the pharmacy.  She has a history of a stroke. She was given the number to help with getting a new PCP. She was instructed that we can not refill her antihypertensive medication. She verbalized understanding. She is aware that he is policy that you have her primary care provider .   Considering: Diagnostic right-sided L5-S1 lumbar epidural steroid injection Diagnostic bilateral lumbar facet block Possiblebilateral lumbar facet RFA Diagnostic left sided cervical epidural steroid injection  Diagnostic bilateral cervical facet block Possible bilateral cervical facet RFA Diagnostic right shoulder injection Diagnostic right suprascapular nerve block Possible right sided suprascapular nerve RFA   Palliative PRN treatment(s): Diagnostic left sided cervical epidural steroid injection       Provider-requested follow-up: Return in about 3 months (around 08/30/2017) for MedMgmt with Me Dionisio David).  Future Appointments  Date Time Provider [place]  08/30/2017 11:30 AM Vevelyn Francois, NP [facility] None   Primary Care Physician: Ricardo Jericho, NP Location: [facility] Note by: Vevelyn Francois NP Date: 05/31/2017; Time: 3:24 PM  Pain Score Disclaimer: We use the NRS-11 scale. This is a self-reported, subjective measurement of pain severity with only modest accuracy. It is used primarily to identify changes within a particular patient. It must be understood that outpatient pain scales are significantly less accurate that those used for research, where they can be applied under ideal controlled circumstances with minimal exposure to variables. In reality, the score is likely to be a combination of pain intensity and pain affect, where pain affect describes the degree of emotional arousal or changes in action readiness caused by the sensory experience of pain. Factors such as social and work situation, setting, emotional state, anxiety levels,  expectation, and prior pain experience may influence pain perception and show large inter-individual differences that may also be affected by time variables.  Patient instructions provided during this appointment: Patient Instructions  ____________________________________________________________________________________________  Medication Rules  Applies to: All patients receiving prescriptions (written or electronic).  Pharmacy of record: Pharmacy where electronic prescriptions will be sent. If written prescriptions are taken to a different pharmacy, please inform the nursing staff. The pharmacy listed in the electronic medical record should be the one where you would like electronic prescriptions to be sent.  Prescription refills: Only during scheduled appointments. Applies to both, written and electronic prescriptions.  NOTE: The following applies primarily to controlled substances (Opioid* Pain Medications).   Patient's responsibilities: 1. Pain Pills: Bring all pain pills to every appointment (except for procedure appointments). 2. Pill Bottles: Bring pills in original pharmacy bottle. Always bring newest bottle. Bring bottle, even if empty. 3. Medication refills: You are responsible for knowing and keeping track of what medications you need refilled. The day before your appointment, write a list of all prescriptions that need to be refilled. Bring that list to your appointment and give it to the admitting nurse. Prescriptions will be written only during appointments. If you forget a medication, it will not be "Called in", "Faxed", or "electronically sent". You will need to get another appointment to get these prescribed. 4. Prescription Accuracy: You are responsible for carefully inspecting your prescriptions before leaving our office. Have the discharge nurse carefully go over each prescription with you, before taking them home. Make sure that your name is accurately spelled, that your  address is correct. Check the name and dose of your medication to make sure it is accurate. Check the number of pills, and the written instructions to make sure they are clear and accurate. Make sure that you are given enough medication to last until your next medication refill appointment. 5. Taking Medication: Take medication as prescribed. Never take more pills than instructed. Never take medication more frequently than prescribed. Taking less pills or less frequently is permitted and encouraged, when it comes to controlled substances (written prescriptions).  6. Inform other Doctors: Always inform, all of your healthcare providers, of all the medications you take. 7. Pain Medication from other Providers: You are not allowed to accept any additional pain medication from any other Doctor or Healthcare provider. There are two exceptions to this rule. (see below) In the event that you require additional pain medication, you are responsible for notifying us, as stated below. 8. Medication Agreement: You are responsible for carefully reading and following our Medication Agreement. This must be signed before receiving any prescriptions from our practice. Safely store a copy of your signed Agreement. Violations to the Agreement will result in no further prescriptions. (Additional copies of our Medication Agreement are available upon request.) 9. Laws, Rules, & Regulations: All patients are expected to follow all Federal and [employer], Rules, [employer]. Ignorance of the Laws does not constitute a valid excuse. The use of any illegal substances is prohibited. 10. Adopted CDC guidelines & recommendations: Target dosing levels will be at or below 60 MME/day. Use of benzodiazepines** is not recommended.  Exceptions: There are only two exceptions to the rule of not receiving pain medications from other Healthcare Providers. 1. Exception #1 (Emergencies): In the event of an emergency (i.e.: accident  requiring emergency care), you are allowed to receive additional pain medication. However, you are responsible for: As soon as you are able, call our office [phone], at any time of the day or night, and leave a message stating your name, the date and nature of the emergency, and the name and dose of the medication prescribed. In  the event that your call is answered by a member of our staff, make sure to document and save the date, time, and the name of the person that took your information.  2. Exception #2 (Planned Surgery): In the event that you are scheduled by another doctor or dentist to have any type of surgery or procedure, you are allowed (for a period no longer than 30 days), to receive additional pain medication, for the acute post-op pain. However, in this case, you are responsible for picking up a copy of our "Post-op Pain Management for Surgeons" handout, and giving it to your surgeon or dentist. This document is available at our office, and does not require an appointment to obtain it. Simply go to our office during business hours (Monday-Thursday from 8:00 AM to 4:00 PM) (Friday 8:00 AM to 12:00 Noon) or if you have a scheduled appointment with [place], prior to your surgery, and ask for it by name. In addition, you will need to provide [place] with your name, name of your surgeon, type of surgery, and date of procedure or surgery.  *Opioid medications include: morphine, codeine, oxycodone, oxymorphone, hydrocodone, hydromorphone, meperidine, tramadol, tapentadol, buprenorphine, fentanyl, methadone. **Benzodiazepine medications include: diazepam (Valium), alprazolam (Xanax), clonazepam (Klonopine), lorazepam (Ativan), clorazepate (Tranxene), chlordiazepoxide (Librium), estazolam (Prosom), oxazepam (Serax), temazepam (Restoril), triazolam (Halcion) (Last updated:  03/31/2017) ____________________________________________________________________________________________   ____________________________________________________________________________________________  Appointment Policy Summary  It is our goal and responsibility to provide the medical community with assistance in the evaluation and management of patients with chronic pain. Unfortunately our resources are limited. Because we do not have an unlimited amount of time, or available appointments, we are required to closely monitor and manage their use. The following rules exist to maximize their use:  Patient's responsibilities: 1. Punctuality:  At what time should I arrive? You should be physically present in our office 30 minutes before your scheduled appointment. Your scheduled appointment is with your assigned healthcare provider. However, it takes 5-10 minutes to be "checked-in", and another 15 minutes for the nurses to do the admission. If you arrive to our office at the time you were given for your appointment, you will end up being at least 20-25 minutes late to your appointment with the provider. 2. Tardiness:  What happens if I arrive only a few minutes after my scheduled appointment time? You will need to reschedule your appointment. The cutoff is your appointment time. This is why it is so important that you arrive at least 30 minutes before that appointment. If you have an appointment scheduled for 10:00 AM and you arrive at 10:01, you will be required to reschedule your appointment.  3. Plan ahead:  Always assume that you will encounter traffic on your way in. Plan for it. If you are dependent on a driver, make sure they understand these rules and the need to arrive early. 4. Other appointments and responsibilities:  Avoid scheduling any other appointments before or after your pain clinic appointments.  5. Be prepared:  Write down everything that you need to discuss with your healthcare  provider and give this information to the admitting nurse. Write down the medications that you will need refilled. Bring your pills and bottles (even the empty ones), to all of your appointments, except for those where a procedure is scheduled. 6. No children or pets:  Find someone to take care of them. It is not appropriate to bring them in. 7. Scheduling changes:  We request "advanced notification" of any changes or cancellations. 8.  Advanced notification:  Defined as a time period of more than 24 hours prior to the originally scheduled appointment. This allows for the appointment to be offered to other patients. 9. Rescheduling:  When a visit is rescheduled, it will require the cancellation of the original appointment. For this reason they both fall within the category of "Cancellations".  10. Cancellations:  They require advanced notification. Any cancellation less than 24 hours before the  appointment will be recorded as a "No Show". 11. No Show:  Defined as an unkept appointment where the patient failed to notify or declare to the practice their intention or inability to keep the appointment.  Corrective process for repeat offenders:  1. Tardiness: Three (3) episodes of rescheduling due to late arrivals will be recorded as one (1) "No Show". 2. Cancellation or reschedule: Three (3) cancellations or rescheduling will be recorded as one (1) "No Show". 3. "No Shows": Three (3) "No Shows" within a 12 month period will result in discharge from the practice. ____________________________________________________________________________________________

## 2017-05-31 NOTE — Patient Instructions (Addendum)
____________________________________________________________________________________________  Medication Rules  Applies to: All patients receiving prescriptions (written or electronic).  Pharmacy of record: Pharmacy where electronic prescriptions will be sent. If written prescriptions are taken to a different pharmacy, please inform the nursing staff. The pharmacy listed in the electronic medical record should be the one where you would like electronic prescriptions to be sent.  Prescription refills: Only during scheduled appointments. Applies to both, written and electronic prescriptions.  NOTE: The following applies primarily to controlled substances (Opioid* Pain Medications).   Patient's responsibilities: 1. Pain Pills: Bring all pain pills to every appointment (except for procedure appointments). 2. Pill Bottles: Bring pills in original pharmacy bottle. Always bring newest bottle. Bring bottle, even if empty. 3. Medication refills: You are responsible for knowing and keeping track of what medications you need refilled. The day before your appointment, write a list of all prescriptions that need to be refilled. Bring that list to your appointment and give it to the admitting nurse. Prescriptions will be written only during appointments. If you forget a medication, it will not be "Called in", "Faxed", or "electronically sent". You will need to get another appointment to get these prescribed. 4. Prescription Accuracy: You are responsible for carefully inspecting your prescriptions before leaving our office. Have the discharge nurse carefully go over each prescription with you, before taking them home. Make sure that your name is accurately spelled, that your address is correct. Check the name and dose of your medication to make sure it is accurate. Check the number of pills, and the written instructions to make sure they are clear and accurate. Make sure that you are given enough medication to last  until your next medication refill appointment. 5. Taking Medication: Take medication as prescribed. Never take more pills than instructed. Never take medication more frequently than prescribed. Taking less pills or less frequently is permitted and encouraged, when it comes to controlled substances (written prescriptions).  6. Inform other Doctors: Always inform, all of your healthcare providers, of all the medications you take. 7. Pain Medication from other Providers: You are not allowed to accept any additional pain medication from any other Doctor or Healthcare provider. There are two exceptions to this rule. (see below) In the event that you require additional pain medication, you are responsible for notifying us, as stated below. 8. Medication Agreement: You are responsible for carefully reading and following our Medication Agreement. This must be signed before receiving any prescriptions from our practice. Safely store a copy of your signed Agreement. Violations to the Agreement will result in no further prescriptions. (Additional copies of our Medication Agreement are available upon request.) 9. Laws, Rules, & Regulations: All patients are expected to follow all Federal and State Laws, Statutes, Rules, & Regulations. Ignorance of the Laws does not constitute a valid excuse. The use of any illegal substances is prohibited. 10. Adopted CDC guidelines & recommendations: Target dosing levels will be at or below 60 MME/day. Use of benzodiazepines** is not recommended.  Exceptions: There are only two exceptions to the rule of not receiving pain medications from other Healthcare Providers. 1. Exception #1 (Emergencies): In the event of an emergency (i.e.: accident requiring emergency care), you are allowed to receive additional pain medication. However, you are responsible for: As soon as you are able, call our office [phone], at any time of the day or night, and leave a message stating your name, the  date and nature of the emergency, and the name and dose of the medication   prescribed. In the event that your call is answered by a member of our staff, make sure to document and save the date, time, and the name of the person that took your information.  2. Exception #2 (Planned Surgery): In the event that you are scheduled by another doctor or dentist to have any type of surgery or procedure, you are allowed (for a period no longer than 30 days), to receive additional pain medication, for the acute post-op pain. However, in this case, you are responsible for picking up a copy of our "Post-op Pain Management for Surgeons" handout, and giving it to your surgeon or dentist. This document is available at our office, and does not require an appointment to obtain it. Simply go to our office during business hours (Monday-Thursday from 8:00 AM to 4:00 PM) (Friday 8:00 AM to 12:00 Noon) or if you have a scheduled appointment with us, prior to your surgery, and ask for it by name. In addition, you will need to provide us with your name, name of your surgeon, type of surgery, and date of procedure or surgery.  *Opioid medications include: morphine, codeine, oxycodone, oxymorphone, hydrocodone, hydromorphone, meperidine, tramadol, tapentadol, buprenorphine, fentanyl, methadone. **Benzodiazepine medications include: diazepam (Valium), alprazolam (Xanax), clonazepam (Klonopine), lorazepam (Ativan), clorazepate (Tranxene), chlordiazepoxide (Librium), estazolam (Prosom), oxazepam (Serax), temazepam (Restoril), triazolam (Halcion) (Last updated: 03/31/2017) ____________________________________________________________________________________________   ____________________________________________________________________________________________  Appointment Policy Summary  It is our goal and responsibility to provide the medical community with assistance in the evaluation and management of patients with chronic pain.  Unfortunately our resources are limited. Because we do not have an unlimited amount of time, or available appointments, we are required to closely monitor and manage their use. The following rules exist to maximize their use:  Patient's responsibilities: 1. Punctuality:  At what time should I arrive? You should be physically present in our office 30 minutes before your scheduled appointment. Your scheduled appointment is with your assigned healthcare provider. However, it takes 5-10 minutes to be "checked-in", and another 15 minutes for the nurses to do the admission. If you arrive to our office at the time you were given for your appointment, you will end up being at least 20-25 minutes late to your appointment with the provider. 2. Tardiness:  What happens if I arrive only a few minutes after my scheduled appointment time? You will need to reschedule your appointment. The cutoff is your appointment time. This is why it is so important that you arrive at least 30 minutes before that appointment. If you have an appointment scheduled for 10:00 AM and you arrive at 10:01, you will be required to reschedule your appointment.  3. Plan ahead:  Always assume that you will encounter traffic on your way in. Plan for it. If you are dependent on a driver, make sure they understand these rules and the need to arrive early. 4. Other appointments and responsibilities:  Avoid scheduling any other appointments before or after your pain clinic appointments.  5. Be prepared:  Write down everything that you need to discuss with your healthcare provider and give this information to the admitting nurse. Write down the medications that you will need refilled. Bring your pills and bottles (even the empty ones), to all of your appointments, except for those where a procedure is scheduled. 6. No children or pets:  Find someone to take care of them. It is not appropriate to bring them in. 7. Scheduling changes:  We request  "advanced notification" of any changes or   cancellations. 8. Advanced notification:  Defined as a time period of more than 24 hours prior to the originally scheduled appointment. This allows for the appointment to be offered to other patients. 9. Rescheduling:  When a visit is rescheduled, it will require the cancellation of the original appointment. For this reason they both fall within the category of "Cancellations".  10. Cancellations:  They require advanced notification. Any cancellation less than 24 hours before the  appointment will be recorded as a "No Show". 11. No Show:  Defined as an unkept appointment where the patient failed to notify or declare to the practice their intention or inability to keep the appointment.  Corrective process for repeat offenders:  1. Tardiness: Three (3) episodes of rescheduling due to late arrivals will be recorded as one (1) "No Show". 2. Cancellation or reschedule: Three (3) cancellations or rescheduling will be recorded as one (1) "No Show". 3. "No Shows": Three (3) "No Shows" within a 12 month period will result in discharge from the practice. ____________________________________________________________________________________________   

## 2017-05-31 NOTE — Progress Notes (Signed)
Nursing Pain Medication Assessment:  Safety precautions to be maintained throughout the outpatient stay will include: orient to surroundings, keep bed in low position, maintain call bell within reach at all times, provide assistance with transfer out of bed and ambulation.  Medication Inspection Compliance: Pill count conducted under aseptic conditions, in front of the patient. Neither the pills nor the bottle was removed from the patient's sight at any time. Once count was completed pills were immediately returned to the patient in their original bottle.  Medication: Oxycodone IR Pill/Patch Count: 0 of 120 pills remain Pill/Patch Appearance: Markings consistent with prescribed medication Bottle Appearance: Standard pharmacy container. Clearly labeled. Filled Date: 03 / 30 / 2019 Last Medication intake:  Today

## 2017-06-17 DIAGNOSIS — R300 Dysuria: Secondary | ICD-10-CM | POA: Diagnosis not present

## 2017-06-17 DIAGNOSIS — N10 Acute tubulo-interstitial nephritis: Secondary | ICD-10-CM | POA: Diagnosis not present

## 2017-07-13 DIAGNOSIS — Z8679 Personal history of other diseases of the circulatory system: Secondary | ICD-10-CM | POA: Diagnosis not present

## 2017-07-13 DIAGNOSIS — Z7689 Persons encountering health services in other specified circumstances: Secondary | ICD-10-CM | POA: Diagnosis not present

## 2017-07-13 DIAGNOSIS — G600 Hereditary motor and sensory neuropathy: Secondary | ICD-10-CM | POA: Diagnosis not present

## 2017-07-26 DIAGNOSIS — Z1329 Encounter for screening for other suspected endocrine disorder: Secondary | ICD-10-CM | POA: Diagnosis not present

## 2017-07-26 DIAGNOSIS — F32A Depression, unspecified: Secondary | ICD-10-CM | POA: Insufficient documentation

## 2017-07-26 DIAGNOSIS — Z1211 Encounter for screening for malignant neoplasm of colon: Secondary | ICD-10-CM | POA: Diagnosis not present

## 2017-07-26 DIAGNOSIS — F329 Major depressive disorder, single episode, unspecified: Secondary | ICD-10-CM | POA: Insufficient documentation

## 2017-07-26 DIAGNOSIS — G600 Hereditary motor and sensory neuropathy: Secondary | ICD-10-CM | POA: Diagnosis not present

## 2017-07-26 DIAGNOSIS — Z1231 Encounter for screening mammogram for malignant neoplasm of breast: Secondary | ICD-10-CM | POA: Diagnosis not present

## 2017-07-26 DIAGNOSIS — I10 Essential (primary) hypertension: Secondary | ICD-10-CM | POA: Diagnosis not present

## 2017-07-26 DIAGNOSIS — E785 Hyperlipidemia, unspecified: Secondary | ICD-10-CM | POA: Diagnosis not present

## 2017-07-26 DIAGNOSIS — F419 Anxiety disorder, unspecified: Secondary | ICD-10-CM | POA: Diagnosis not present

## 2017-08-30 ENCOUNTER — Ambulatory Visit: Payer: Medicare Other | Attending: Nurse Practitioner | Admitting: Nurse Practitioner

## 2017-08-30 ENCOUNTER — Other Ambulatory Visit: Payer: Self-pay

## 2017-08-30 ENCOUNTER — Encounter: Payer: Self-pay | Admitting: Nurse Practitioner

## 2017-08-30 VITALS — BP 149/94 | HR 57 | Temp 98.0°F | Resp 16 | Ht 64.0 in | Wt 135.0 lb

## 2017-08-30 DIAGNOSIS — M79671 Pain in right foot: Secondary | ICD-10-CM | POA: Diagnosis present

## 2017-08-30 DIAGNOSIS — M4722 Other spondylosis with radiculopathy, cervical region: Secondary | ICD-10-CM

## 2017-08-30 DIAGNOSIS — E559 Vitamin D deficiency, unspecified: Secondary | ICD-10-CM | POA: Insufficient documentation

## 2017-08-30 DIAGNOSIS — Z820 Family history of epilepsy and other diseases of the nervous system: Secondary | ICD-10-CM | POA: Insufficient documentation

## 2017-08-30 DIAGNOSIS — Z880 Allergy status to penicillin: Secondary | ICD-10-CM | POA: Insufficient documentation

## 2017-08-30 DIAGNOSIS — G894 Chronic pain syndrome: Secondary | ICD-10-CM

## 2017-08-30 DIAGNOSIS — G950 Syringomyelia and syringobulbia: Secondary | ICD-10-CM | POA: Insufficient documentation

## 2017-08-30 DIAGNOSIS — M47812 Spondylosis without myelopathy or radiculopathy, cervical region: Secondary | ICD-10-CM | POA: Diagnosis not present

## 2017-08-30 DIAGNOSIS — M1288 Other specific arthropathies, not elsewhere classified, other specified site: Secondary | ICD-10-CM | POA: Insufficient documentation

## 2017-08-30 DIAGNOSIS — Z9181 History of falling: Secondary | ICD-10-CM | POA: Diagnosis not present

## 2017-08-30 DIAGNOSIS — M47816 Spondylosis without myelopathy or radiculopathy, lumbar region: Secondary | ICD-10-CM

## 2017-08-30 DIAGNOSIS — M5116 Intervertebral disc disorders with radiculopathy, lumbar region: Secondary | ICD-10-CM | POA: Diagnosis not present

## 2017-08-30 DIAGNOSIS — G629 Polyneuropathy, unspecified: Secondary | ICD-10-CM | POA: Insufficient documentation

## 2017-08-30 DIAGNOSIS — M25511 Pain in right shoulder: Secondary | ICD-10-CM | POA: Diagnosis not present

## 2017-08-30 DIAGNOSIS — Z90710 Acquired absence of both cervix and uterus: Secondary | ICD-10-CM | POA: Insufficient documentation

## 2017-08-30 DIAGNOSIS — G600 Hereditary motor and sensory neuropathy: Secondary | ICD-10-CM | POA: Diagnosis not present

## 2017-08-30 DIAGNOSIS — Z8673 Personal history of transient ischemic attack (TIA), and cerebral infarction without residual deficits: Secondary | ICD-10-CM | POA: Diagnosis not present

## 2017-08-30 DIAGNOSIS — G8929 Other chronic pain: Secondary | ICD-10-CM | POA: Diagnosis not present

## 2017-08-30 DIAGNOSIS — Z79899 Other long term (current) drug therapy: Secondary | ICD-10-CM | POA: Insufficient documentation

## 2017-08-30 DIAGNOSIS — M5030 Other cervical disc degeneration, unspecified cervical region: Secondary | ICD-10-CM | POA: Insufficient documentation

## 2017-08-30 DIAGNOSIS — F4323 Adjustment disorder with mixed anxiety and depressed mood: Secondary | ICD-10-CM | POA: Insufficient documentation

## 2017-08-30 DIAGNOSIS — Z8249 Family history of ischemic heart disease and other diseases of the circulatory system: Secondary | ICD-10-CM | POA: Insufficient documentation

## 2017-08-30 DIAGNOSIS — M7918 Myalgia, other site: Secondary | ICD-10-CM | POA: Insufficient documentation

## 2017-08-30 DIAGNOSIS — I10 Essential (primary) hypertension: Secondary | ICD-10-CM | POA: Diagnosis not present

## 2017-08-30 DIAGNOSIS — Z7982 Long term (current) use of aspirin: Secondary | ICD-10-CM | POA: Insufficient documentation

## 2017-08-30 DIAGNOSIS — Z79891 Long term (current) use of opiate analgesic: Secondary | ICD-10-CM | POA: Diagnosis not present

## 2017-08-30 DIAGNOSIS — Z87440 Personal history of urinary (tract) infections: Secondary | ICD-10-CM | POA: Insufficient documentation

## 2017-08-30 DIAGNOSIS — M25561 Pain in right knee: Secondary | ICD-10-CM | POA: Diagnosis not present

## 2017-08-30 DIAGNOSIS — M25562 Pain in left knee: Secondary | ICD-10-CM | POA: Diagnosis not present

## 2017-08-30 MED ORDER — OXYCODONE HCL 5 MG PO TABS: 5.0000 mg | ORAL_TABLET | Freq: Four times a day (QID) | ORAL | 0 refills | Status: DC | PRN

## 2017-08-30 MED ORDER — GABAPENTIN 100 MG PO CAPS: 200.0000 mg | ORAL_CAPSULE | Freq: Three times a day (TID) | ORAL | 2 refills | Status: DC

## 2017-08-30 NOTE — Progress Notes (Signed)
Nursing Pain Medication Assessment:  Safety precautions to be maintained throughout the outpatient stay will include: orient to surroundings, keep bed in low position, maintain call bell within reach at all times, provide assistance with transfer out of bed and ambulation.  Medication Inspection Compliance: Pill count conducted under aseptic conditions, in front of the patient. Neither the pills nor the bottle was removed from the patient's sight at any time. Once count was completed pills were immediately returned to the patient in their original bottle.  Medication: See above Pill/Patch Count: 0 of 120 pills remain Pill/Patch Appearance: Markings consistent with prescribed medication Bottle Appearance: Standard pharmacy container. Clearly labeled. Filled Date: 6 / 29 / 2019 Last Medication intake:  Today

## 2017-08-30 NOTE — Progress Notes (Addendum)
Patient's Name: Maria Wheeler  MRN: [id number]  Referring Provider: Ricardo Jericho*  DOB: 13-May-1955  PCP: Baxter Hire, MD  DOS: 08/30/2017  Note by: Vevelyn Francois NP  Service setting: Ambulatory outpatient  Specialty: Interventional Pain Management  Location: [facility] (AMB) Pain Management Facility    Patient type: Established    Primary Reason(s) for Visit: Encounter for prescription drug management. (Level of risk: moderate)  CC: Foot Pain (bilateral) and Leg Pain (bilateral)  HPI  Maria Wheeler is a 62 y.o. year old, female patient, who comes today for a medication management evaluation. She has HTN (hypertension), benign; Auditory hallucinations; History of drug dependence ([place]); Benzodiazepine dependence ([place]); Chronic anxiety; Diastolic dysfunction; Multiple falls; UTI (urinary tract infection); Hypokalemia; Depression with anxiety; Benzodiazepine abuse ([place]); Chronic musculoskeletal pain; CMT (Charcot-Marie-Tooth disease); H/O: stroke; SDH (subdural hematoma) ([place]); Disorder of skeletal system; Pharmacologic therapy; Problems influencing health status; Chronic neck pain (Tertiary Area of Pain) (Posterior) (Bilateral) (L>R); DDD (degenerative disc disease), cervical; Cervical spondylosis; Chronic shoulder pain (Right); Chronic upper extremity pain (Left); Chronic pain syndrome; Chronic knee pain (B) (L>R); Chronic lumbar radiculopathy (L5/S1) (Left); Chronic lower extremity pain (Primary Area of Pain) (Bilateral) (L>R); Cervical Grade 1 Retrolisthesis of C5 over C6; Abnormal nerve conduction studies; Chronic upper extremity polyneuropathy (severe) (CMT); 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[place]); Adjustment disorder with mixed anxiety and depressed mood; Chronic upper extremity pain (Secondary Area of Pain) (Bilateral) (L>R); DDD (degenerative disc disease), lumbar; Lumbar facet arthropathy (Bilateral); 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 Her primarily concern today is the Foot Pain (bilateral) and Leg Pain (bilateral)  Pain Assessment: Location: Right, Left Foot Radiating: entire body Onset: More than a month ago Duration: Chronic pain Quality: Aching, Discomfort, Nagging Severity: 10-Worst pain ever/10 (subjective, self-reported pain score)  Note: Reported level is compatible with observation. Clinically the patient looks like a 1/10 A 1/10 is viewed as "Mild" and described as nagging, annoying, but not interfering with basic activities of daily living (ADL). Maria Wheeler is able to eat, bathe, get dressed, do toileting (being able to get on and off the toilet and perform personal hygiene functions), transfer (move in and out of bed or a chair without assistance), and maintain continence (able to control bladder and bowel functions). Physiologic parameters such as blood pressure and heart rate apear wnl.       When using our objective Pain Scale, levels between 6 and 10/10 are said to belong in an emergency room, as it progressively worsens from a 6/10, described as severely limiting, requiring emergency care not usually available at an outpatient pain management facility. At a 6/10 level, communication becomes difficult and requires great effort. Assistance to reach the emergency department may be required. Facial flushing and profuse sweating along with potentially dangerous increases in heart rate and blood pressure will be evident. Effect on ADL: Hard to do anything, "I hurt all the time" Timing: Constant Modifying factors: medications sometimes BP: (!) 149/94  HR: (!) 57  Maria Wheeler was last scheduled for an appointment on  05/31/2017 for medication management. During today's appointment we reviewed Maria Wheeler's chronic pain status, as well as her outpatient medication regimen. She admits that her pain is getting worse. She has to crawl in order to  get things off the floor. She admits that she is not sleeping. She states that she does have to get up at night because of the pain. She admits that she does get up and walks her hall during the night to help with the pain. She admits that she does this about 2-3 nights per week. She does not feel like this is effective now. She admits that she takes her cell phone with her.   The patient  reports that she does not use drugs. Her body mass index is 23.17 kg/m.  Further details on both, my assessment(s), as well as the proposed treatment plan, please see below.  Controlled Substance Pharmacotherapy Assessment REMS (Risk Evaluation and Mitigation Strategy)  Analgesic:Oxycodone IR 5 mg 1 tablet by mouth every 6 hours when necessary ('20mg'$ /dayof oxycodone) MME/day:'30mg'$ /day. Ignatius Specking, RN  08/30/2017 11:21 AM  Sign at close encounter Nursing Pain Medication Assessment:  Safety precautions to be maintained throughout the outpatient stay will include: orient to surroundings, keep bed in low position, maintain call bell within reach at all times, provide assistance with transfer out of bed and ambulation.  Medication Inspection Compliance: Pill count conducted under aseptic conditions, in front of the patient. Neither the pills nor the bottle was removed from the patient's sight at any time. Once count was completed pills were immediately returned to the patient in their original bottle.  Medication: See above Pill/Patch Count: 0 of 120 pills remain Pill/Patch Appearance: Markings consistent with prescribed medication Bottle Appearance: Standard pharmacy container. Clearly labeled. Filled Date: 6 / 29 / 2019 Last Medication intake:  Today    Pharmacokinetics: Liberation and absorption (onset of action): WNL Distribution (time to peak effect): WNL Metabolism and excretion (duration of action): WNL         Pharmacodynamics: Desired effects: Analgesia: Maria Wheeler reports >50% benefit. Functional ability: Patient reports that medication allows her to accomplish basic ADLs Clinically meaningful improvement in function (CMIF): Sustained CMIF goals met Perceived effectiveness: Described as relatively effective, allowing for increase in activities of daily living (ADL) Undesirable effects: Side-effects or Adverse reactions: None reported Monitoring: [place] PMP: Online review of the past 36-monthperiod conducted. Compliant with practice rules and regulations Last UDS on record: Summary  Date Value Ref Range Status  03/23/2017 FINAL  Final    Comment:    ==================================================================== TOXASSURE SELECT 13 (MW) ==================================================================== Test                             Result       Flag       Units Drug Present and Declared for Prescription Verification   Oxycodone                      1665         EXPECTED   ng/mg creat   Oxymorphone                    4479         EXPECTED   ng/mg creat   Noroxycodone                   2363         EXPECTED   ng/mg creat   Noroxymorphone                 763          EXPECTED  ng/mg creat    Sources of oxycodone are scheduled prescription medications.    Oxymorphone, noroxycodone, and noroxymorphone are expected    metabolites of oxycodone. Oxymorphone is also available as a    scheduled prescription medication. Drug Present not Declared for Prescription Verification   Lorazepam                      1022         UNEXPECTED ng/mg creat    Source of lorazepam is a scheduled prescription medication. ==================================================================== Test                      Result    Flag   Units       Ref Range   Creatinine              156              mg/dL      >=20 ==================================================================== Declared Medications:  The flagging and interpretation on this report are based on the  following declared medications.  Unexpected results may arise from  inaccuracies in the declared medications.  **Note: The testing scope of this panel includes these medications:  Oxycodone  **Note: The testing scope of this panel does not include following  reported medications:  Aspirin  Atenolol  Calcium carbonate (Calcium carbonate/Vitamin D3)  Gabapentin  Ibuprofen  Lisinopril  Vitamin D3 (Calcium carbonate/Vitamin D3) ==================================================================== For clinical consultation, please call [phone]. ====================================================================    UDS interpretation: Non-Compliant Patient reminded of the CDC guidelines recommending to stay away from the sedatives & benzodiazepines due to the risk of respiratory depression and death. Medication Assessment Form: Discrepancies found between patient's report and information collected Treatment compliance: Compliant Risk Assessment Profile: Aberrant behavior: See prior evaluations. None observed or detected today Comorbid factors increasing risk of overdose: concomitant use of Benzodiazepines Risk of substance use disorder (SUD): Moderate Opioid Risk Tool - 08/30/17 1119      Family History of Substance Abuse   Alcohol  Negative    Illegal Drugs  Negative    Rx Drugs  Negative      Personal History of Substance Abuse   Alcohol  Negative    Illegal Drugs  Negative    Rx Drugs  Negative      Total Score   Opioid Risk Tool Scoring  0    Opioid Risk Interpretation  Low Risk      ORT Scoring interpretation table:  Score <3 = Low Risk for SUD  Score between 4-7 = Moderate Risk for SUD  Score >8 = High Risk for Opioid Abuse   Risk  Mitigation Strategies:  Patient Counseling: Covered Patient-Prescriber Agreement (PPA): Present and active  Notification to other healthcare providers: Done  Pharmacologic Plan: No change in therapy, at this time.             Laboratory Chemistry  Inflammation Markers (CRP: Acute Phase) (ESR: Chronic Phase) Lab Results  Component Value Date   CRP 1.2 11/10/2016   ESRSEDRATE 5 11/10/2016                         Rheumatology Markers No results found for: RF, ANA, LABURIC, URICUR, LYMEIGGIGMAB, LYMEABIGMQN, HLAB27                      Renal Function Markers Lab Results  Component Value Date   BUN 18 11/10/2016  CREATININE 0.87 11/10/2016   BCR 21 11/10/2016   GFRAA 83 11/10/2016   GFRNONAA 72 11/10/2016                             Hepatic Function Markers Lab Results  Component Value Date   AST 11 11/10/2016   ALT 13 (L) 12/31/2015   ALBUMIN 4.2 11/10/2016   ALKPHOS 81 11/10/2016   LIPASE 33 07/16/2014                        Electrolytes Lab Results  Component Value Date   NA 141 11/10/2016   K 3.8 11/10/2016   CL 100 11/10/2016   CALCIUM 9.3 11/10/2016   MG 2.0 11/10/2016                        Neuropathy Markers Lab Results  Component Value Date   VITAMINB12 615 11/10/2016                        Bone Pathology Markers Lab Results  Component Value Date   VD25OH 13.5 (L) 12/31/2015   25OHVITD1 13 (L) 11/10/2016   25OHVITD2 <1.0 11/10/2016   25OHVITD3 13 11/10/2016                         Coagulation Parameters Lab Results  Component Value Date   INR 1.0 12/30/2013   LABPROT 13.2 12/30/2013   APTT 28.9 12/30/2013   PLT 214 12/31/2015                        Cardiovascular Markers Lab Results  Component Value Date   CKTOTAL 36 (L) 12/31/2015   TROPONINI <0.03 12/31/2015   HGB 13.5 12/31/2015   HCT 39.3 12/31/2015                         CA Markers No results found for: CEA, CA125, LABCA2                      Note: Lab results  reviewed.  Recent Diagnostic Imaging Results  DG C-Arm 1-60 Min-No Report Fluoroscopy was utilized by the requesting physician.  No radiographic  interpretation.   Complexity Note: Imaging results reviewed. Results shared with Maria Wheeler, using Layman's terms.                         Meds   Current Outpatient Medications:  .  amLODipine (NORVASC) 5 MG tablet, Take by mouth., Disp: , Rfl:  .  aspirin EC 81 MG tablet, Take 81 mg by mouth daily., Disp: , Rfl:  .  atenolol (TENORMIN) 100 MG tablet, Take by mouth daily. , Disp: , Rfl:  .  ibuprofen (ADVIL,MOTRIN) 200 MG tablet, Take 200 mg by mouth every 6 (six) hours as needed., Disp: , Rfl:  .  lisinopril (PRINIVIL,ZESTRIL) 10 MG tablet, Take 10 mg by mouth. , Disp: , Rfl:  .  Calcium Carb-Cholecalciferol (CALCIUM PLUS D3 ABSORBABLE) [phone] MG-UNIT CAPS, Take 1 capsule by mouth daily with breakfast., Disp: 90 capsule, Rfl: 0 .  gabapentin (NEURONTIN) 100 MG capsule, Take 2 capsules (200 mg total) by mouth 3 (three) times daily., Disp: 180 capsule, Rfl: 2 .  [START ON 10/29/2017] oxyCODONE (OXY IR/ROXICODONE)  5 MG immediate release tablet, Take 1 tablet (5 mg total) by mouth every 6 (six) hours as needed for severe pain., Disp: 120 tablet, Rfl: 0 .  [START ON 09/29/2017] oxyCODONE (OXY IR/ROXICODONE) 5 MG immediate release tablet, Take 1 tablet (5 mg total) by mouth every 6 (six) hours as needed for severe pain., Disp: 120 tablet, Rfl: 0 .  oxyCODONE (OXY IR/ROXICODONE) 5 MG immediate release tablet, Take 1 tablet (5 mg total) by mouth every 6 (six) hours as needed for severe pain., Disp: 120 tablet, Rfl: 0  ROS  Constitutional: Denies any fever or chills Gastrointestinal: No reported hemesis, hematochezia, vomiting, or acute GI distress Musculoskeletal: Denies any acute onset joint swelling, redness, loss of ROM, or weakness Neurological: No reported episodes of acute onset apraxia, aphasia, dysarthria, agnosia, amnesia, paralysis, loss of  coordination, or loss of consciousness  Allergies  Maria Wheeler is allergic to penicillins.  [place]  Drug: Maria Wheeler  reports that she does not use drugs. Alcohol:  reports that she drinks about 0.6 oz of alcohol per week. Tobacco:  reports that she has never smoked. She has never used smokeless tobacco. Medical:  has a past medical history of Abdominal pain (12/22/2012), Abnormal EKG (12/11/2011), Allergy, Altered mental status (12/11/2011), Anxiety, Benzodiazepine dependence ([place]) (12/14/2011), Charcot-Marie disease, Chronic anxiety (12/14/2011), Dehydration (45/05/979), Diastolic dysfunction (19/14/7829), Drug withdrawal ([place]) (12/12/2011), Hypertension, Hypokalemia (12/11/2011), and Psychosis ([place]) (12/12/2011). Surgical: Maria Wheeler  has a past surgical history that includes Abdominal hysterectomy. Family: family history includes Charcot-Marie-Tooth disease in her mother and son; Heart disease in her father.  Constitutional Exam  General appearance: Well nourished, well developed, and well hydrated. In no apparent acute distress Vitals:   08/30/17 1107  BP: (!) 149/94  Pulse: (!) 57  Resp: 16  Temp: 98 F (36.7 C)  SpO2: 100%  Weight: 135 lb (61.2 kg)  Height: _0  (1.626 m)   BMI Assessment: Estimated body mass index is 23.17 kg/m as calculated from the following:   Height as of this encounter: _1  (1.626 m).   Weight as of this encounter: 135 lb (61.2 kg). Psych/Mental status: Alert, oriented x 3 (person, place, & time)       Eyes: PERLA Respiratory: No evidence of acute respiratory distress  Cervical Spine Area Exam  Skin & Axial Inspection: No masses, redness, edema, swelling, or associated skin lesions Alignment: Symmetrical Functional ROM: Unrestricted ROM      Stability: No instability detected Muscle Tone/Strength: Functionally intact. No obvious neuro-muscular anomalies detected. Sensory (Neurological): Unimpaired Palpation: No palpable anomalies               Upper Extremity (UE) Exam    Side: Right upper extremity  Side: Left upper extremity  Skin & Extremity Inspection: Skin color, temperature, and hair growth are WNL. No peripheral edema or cyanosis. No masses, redness, swelling, asymmetry, or associated skin lesions. No contractures.  Skin & Extremity Inspection: Skin color, temperature, and hair growth are WNL. No peripheral edema or cyanosis. No masses, redness, swelling, asymmetry, or associated skin lesions. No contractures.  Functional ROM: Unrestricted ROM          Functional ROM: Unrestricted ROM          Muscle Tone/Strength: Functionally intact. No obvious neuro-muscular anomalies detected.  Muscle Tone/Strength: Functionally intact. No obvious neuro-muscular anomalies detected.  Sensory (Neurological): Unimpaired          Sensory (Neurological): Unimpaired          Palpation: No  palpable anomalies              Palpation: No palpable anomalies              Provocative Test(s):  Phalen's test: deferred Tinel's test: deferred Apley's scratch test (touch opposite shoulder):  Action 1 (Across chest): deferred Action 2 (Overhead): deferred Action 3 (LB reach): deferred   Provocative Test(s):  Phalen's test: deferred Tinel's test: deferred Apley's scratch test (touch opposite shoulder):  Action 1 (Across chest): deferred Action 2 (Overhead): deferred Action 3 (LB reach): deferred    Thoracic Spine Area Exam  Skin & Axial Inspection: No masses, redness, or swelling Alignment: Symmetrical Functional ROM: Unrestricted ROM Stability: No instability detected Muscle Tone/Strength: Functionally intact. No obvious neuro-muscular anomalies detected. Sensory (Neurological): Unimpaired Muscle strength & Tone: No palpable anomalies  Lumbar Spine Area Exam  Skin & Axial Inspection: No masses, redness, or swelling Alignment: Symmetrical Functional ROM: Unrestricted ROM       Stability: No instability detected Muscle Tone/Strength:  Functionally intact. No obvious neuro-muscular anomalies detected. Sensory (Neurological): Unimpaired Palpation: No palpable anomalies       Provocative Tests: Lumbar Hyperextension/rotation test: deferred today       Lumbar quadrant test (Kemp's test): deferred today       Lumbar Lateral bending test: deferred today       Patrick's Maneuver: deferred today                   FABER test: deferred today                   Thigh-thrust test: deferred today       S-I compression test: deferred today       S-I distraction test: deferred today        Gait & Posture Assessment  Ambulation: Unassisted Gait: Relatively normal for age and body habitus Posture: WNL   Lower Extremity Exam    Side: Right lower extremity  Side: Left lower extremity  Stability: No instability observed          Stability: No instability observed          Skin & Extremity Inspection: Skin color, temperature, and hair growth are WNL. No peripheral edema or cyanosis. No masses, redness, swelling, asymmetry, or associated skin lesions. No contractures.  Skin & Extremity Inspection: Skin color, temperature, and hair growth are WNL. No peripheral edema or cyanosis. No masses, redness, swelling, asymmetry, or associated skin lesions. No contractures.  Functional ROM: Unrestricted ROM                  Functional ROM: Unrestricted ROM                  Muscle Tone/Strength: Functionally intact. No obvious neuro-muscular anomalies detected.  Muscle Tone/Strength: Functionally intact. No obvious neuro-muscular anomalies detected.  Sensory (Neurological): Unimpaired  Sensory (Neurological): Unimpaired  Palpation: No palpable anomalies  Palpation: No palpable anomalies   Assessment  Primary Diagnosis & Pertinent Problem List: The primary encounter diagnosis was CMT (Charcot-Marie-Tooth disease). Diagnoses of Cervical spondylosis, Lumbar facet arthropathy (Bilateral), Chronic musculoskeletal pain, Chronic pain syndrome, and Long term  prescription opiate use were also pertinent to this visit.  Status Diagnosis  Controlled Controlled Controlled 1. CMT (Charcot-Marie-Tooth disease)   2. Cervical spondylosis   3. Lumbar facet arthropathy (Bilateral)   4. Chronic musculoskeletal pain   5. Chronic pain syndrome   6. Long term prescription opiate use     Problems  updated and reviewed during this visit: Problem  Depression, Prolonged   Plan of Care  Pharmacotherapy (Medications Ordered): Meds ordered this encounter  Medications  . gabapentin (NEURONTIN) 100 MG capsule    Sig: Take 2 capsules (200 mg total) by mouth 3 (three) times daily.    Dispense:  180 capsule    Refill:  2    Do not place medication on "Automatic Refill". Fill one day early if pharmacy is closed on scheduled refill date.    Order Specific Question:   Supervising Provider    Answer:   Milinda Pointer [phone]  . oxyCODONE (OXY IR/ROXICODONE) 5 MG immediate release tablet    Sig: Take 1 tablet (5 mg total) by mouth every 6 (six) hours as needed for severe pain.    Dispense:  120 tablet    Refill:  0    Do not place this medication, or any other prescription from our practice, on "Automatic Refill". Patient may have prescription filled one day early if pharmacy is closed on scheduled refill date. Do not fill until:10/29/2017 To last until:11/28/2017    Order Specific Question:   Supervising Provider    Answer:   Milinda Pointer [phone]  . oxyCODONE (OXY IR/ROXICODONE) 5 MG immediate release tablet    Sig: Take 1 tablet (5 mg total) by mouth every 6 (six) hours as needed for severe pain.    Dispense:  120 tablet    Refill:  0    Do not place this medication, or any other prescription from our practice, on "Automatic Refill". Patient may have prescription filled one day early if pharmacy is closed on scheduled refill date. Do not fill until: 09/29/2017 To last until: 10/29/2017    Order Specific Question:   Supervising Provider    Answer:    Milinda Pointer [phone]  . oxyCODONE (OXY IR/ROXICODONE) 5 MG immediate release tablet    Sig: Take 1 tablet (5 mg total) by mouth every 6 (six) hours as needed for severe pain.    Dispense:  120 tablet    Refill:  0    Do not place this medication, or any other prescription from our practice, on "Automatic Refill". Patient may have prescription filled one day early if pharmacy is closed on scheduled refill date. Do not fill until:08/30/2017 To last until:09/29/2017    Order Specific Question:   Supervising Provider    Answer:   Milinda Pointer [phone]   New Prescriptions   No medications on file   Medications administered today: Maria Wheeler" had no medications administered during this visit. Lab-work, procedure(s), and/or referral(s): Orders Placed This Encounter  Procedures  . ToxASSURE Select 13 (MW), Urine   Imaging and/or referral(s): None  Interventional therapies: Planned, scheduled, and/or pending:   Not at this time. She has new PCP and is doing well.    Considering: Diagnostic right-sided L5-S1 lumbar epidural steroid injection Diagnostic bilateral lumbar facet block Possiblebilateral lumbar facet RFA Diagnostic left sided cervical epidural steroid injection  Diagnostic bilateral cervical facet block Possible bilateral cervical facet RFA Diagnostic right shoulder injection Diagnostic right suprascapular nerve block Possible right sided suprascapular nerve RFA   Palliative PRN treatment(s): Diagnostic left sided cervical epidural steroid injection        Provider-requested follow-up: Return in about 3 months (around 11/30/2017) for MedMgmt with Me Dionisio David).  Future Appointments  Date Time Provider [place]  11/28/2017 11:00 AM Vevelyn Francois, NP [facility] None   Primary Care Physician: Harrel Lemon  D, MD Location: [facility] Note by: Vevelyn Francois NP Date: 08/30/2017;  Time: 3:39 PM  Pain Score Disclaimer: We use the NRS-11 scale. This is a self-reported, subjective measurement of pain severity with only modest accuracy. It is used primarily to identify changes within a particular patient. It must be understood that outpatient pain scales are significantly less accurate that those used for research, where they can be applied under ideal controlled circumstances with minimal exposure to variables. In reality, the score is likely to be a combination of pain intensity and pain affect, where pain affect describes the degree of emotional arousal or changes in action readiness caused by the sensory experience of pain. Factors such as social and work situation, setting, emotional state, anxiety levels, expectation, and prior pain experience may influence pain perception and show large inter-individual differences that may also be affected by time variables.  Patient instructions provided during this appointment: Patient Instructions  ____________________________________________________________________________________________  Medication Rules  Applies to: All patients receiving prescriptions (written or electronic).  Pharmacy of record: Pharmacy where electronic prescriptions will be sent. If written prescriptions are taken to a different pharmacy, please inform the nursing staff. The pharmacy listed in the electronic medical record should be the one where you would like electronic prescriptions to be sent.  Prescription refills: Only during scheduled appointments. Applies to both, written and electronic prescriptions.  NOTE: The following applies primarily to controlled substances (Opioid* Pain Medications).   Patient's responsibilities: 1. Pain Pills: Bring all pain pills to every appointment (except for procedure appointments). 2. Pill Bottles: Bring pills in original pharmacy bottle. Always bring newest bottle. Bring bottle, even if empty. 3. Medication refills:  You are responsible for knowing and keeping track of what medications you need refilled. The day before your appointment, write a list of all prescriptions that need to be refilled. Bring that list to your appointment and give it to the admitting nurse. Prescriptions will be written only during appointments. If you forget a medication, it will not be "Called in", "Faxed", or "electronically sent". You will need to get another appointment to get these prescribed. 4. Prescription Accuracy: You are responsible for carefully inspecting your prescriptions before leaving our office. Have the discharge nurse carefully go over each prescription with you, before taking them home. Make sure that your name is accurately spelled, that your address is correct. Check the name and dose of your medication to make sure it is accurate. Check the number of pills, and the written instructions to make sure they are clear and accurate. Make sure that you are given enough medication to last until your next medication refill appointment. 5. Taking Medication: Take medication as prescribed. Never take more pills than instructed. Never take medication more frequently than prescribed. Taking less pills or less frequently is permitted and encouraged, when it comes to controlled substances (written prescriptions).  6. Inform other Doctors: Always inform, all of your healthcare providers, of all the medications you take. 7. Pain Medication from other Providers: You are not allowed to accept any additional pain medication from any other Doctor or Healthcare provider. There are two exceptions to this rule. (see below) In the event that you require additional pain medication, you are responsible for notifying us, as stated below. 8. Medication Agreement: You are responsible for carefully reading and following our Medication Agreement. This must be signed before receiving any prescriptions from our practice. Safely store a copy of your signed  Agreement. Violations to the  Agreement will result in no further prescriptions. (Additional copies of our Medication Agreement are available upon request.) 9. Laws, Rules, & Regulations: All patients are expected to follow all Federal and [employer], [employer], Rules, [facility]. Ignorance of the Laws does not constitute a valid excuse. The use of any illegal substances is prohibited. 10. Adopted CDC guidelines & recommendations: Target dosing levels will be at or below 60 MME/day. Use of benzodiazepines** is not recommended.  Exceptions: There are only two exceptions to the rule of not receiving pain medications from other Healthcare Providers. 1. Exception #1 (Emergencies): In the event of an emergency (i.e.: accident requiring emergency care), you are allowed to receive additional pain medication. However, you are responsible for: As soon as you are able, call our office [phone], at any time of the day or night, and leave a message stating your name, the date and nature of the emergency, and the name and dose of the medication prescribed. In the event that your call is answered by a member of our staff, make sure to document and save the date, time, and the name of the person that took your information.  2. Exception #2 (Planned Surgery): In the event that you are scheduled by another doctor or dentist to have any type of surgery or procedure, you are allowed (for a period no longer than 30 days), to receive additional pain medication, for the acute post-op pain. However, in this case, you are responsible for picking up a copy of our "Post-op Pain Management for Surgeons" handout, and giving it to your surgeon or dentist. This document is available at our office, and does not require an appointment to obtain it. Simply go to our office during business hours (Monday-Thursday from 8:00 AM to 4:00 PM) (Friday 8:00 AM to 12:00 Noon) or if you have a scheduled appointment with [place], prior to your surgery,  and ask for it by name. In addition, you will need to provide [place] with your name, name of your surgeon, type of surgery, and date of procedure or surgery.  *Opioid medications include: morphine, codeine, oxycodone, oxymorphone, hydrocodone, hydromorphone, meperidine, tramadol, tapentadol, buprenorphine, fentanyl, methadone. **Benzodiazepine medications include: diazepam (Valium), alprazolam (Xanax), clonazepam (Klonopine), lorazepam (Ativan), clorazepate (Tranxene), chlordiazepoxide (Librium), estazolam (Prosom), oxazepam (Serax), temazepam (Restoril), triazolam (Halcion) (Last updated: 03/31/2017) ____________________________________________________________________________________________

## 2017-08-30 NOTE — Patient Instructions (Signed)
____________________________________________________________________________________________  Medication Rules  Applies to: All patients receiving prescriptions (written or electronic).  Pharmacy of record: Pharmacy where electronic prescriptions will be sent. If written prescriptions are taken to a different pharmacy, please inform the nursing staff. The pharmacy listed in the electronic medical record should be the one where you would like electronic prescriptions to be sent.  Prescription refills: Only during scheduled appointments. Applies to both, written and electronic prescriptions.  NOTE: The following applies primarily to controlled substances (Opioid* Pain Medications).   Patient's responsibilities: 1. Pain Pills: Bring all pain pills to every appointment (except for procedure appointments). 2. Pill Bottles: Bring pills in original pharmacy bottle. Always bring newest bottle. Bring bottle, even if empty. 3. Medication refills: You are responsible for knowing and keeping track of what medications you need refilled. The day before your appointment, write a list of all prescriptions that need to be refilled. Bring that list to your appointment and give it to the admitting nurse. Prescriptions will be written only during appointments. If you forget a medication, it will not be "Called in", "Faxed", or "electronically sent". You will need to get another appointment to get these prescribed. 4. Prescription Accuracy: You are responsible for carefully inspecting your prescriptions before leaving our office. Have the discharge nurse carefully go over each prescription with you, before taking them home. Make sure that your name is accurately spelled, that your address is correct. Check the name and dose of your medication to make sure it is accurate. Check the number of pills, and the written instructions to make sure they are clear and accurate. Make sure that you are given enough medication to last  until your next medication refill appointment. 5. Taking Medication: Take medication as prescribed. Never take more pills than instructed. Never take medication more frequently than prescribed. Taking less pills or less frequently is permitted and encouraged, when it comes to controlled substances (written prescriptions).  6. Inform other Doctors: Always inform, all of your healthcare providers, of all the medications you take. 7. Pain Medication from other Providers: You are not allowed to accept any additional pain medication from any other Doctor or Healthcare provider. There are two exceptions to this rule. (see below) In the event that you require additional pain medication, you are responsible for notifying us, as stated below. 8. Medication Agreement: You are responsible for carefully reading and following our Medication Agreement. This must be signed before receiving any prescriptions from our practice. Safely store a copy of your signed Agreement. Violations to the Agreement will result in no further prescriptions. (Additional copies of our Medication Agreement are available upon request.) 9. Laws, Rules, & Regulations: All patients are expected to follow all Federal and State Laws, Statutes, Rules, & Regulations. Ignorance of the Laws does not constitute a valid excuse. The use of any illegal substances is prohibited. 10. Adopted CDC guidelines & recommendations: Target dosing levels will be at or below 60 MME/day. Use of benzodiazepines** is not recommended.  Exceptions: There are only two exceptions to the rule of not receiving pain medications from other Healthcare Providers. 1. Exception #1 (Emergencies): In the event of an emergency (i.e.: accident requiring emergency care), you are allowed to receive additional pain medication. However, you are responsible for: As soon as you are able, call our office [phone], at any time of the day or night, and leave a message stating your name, the  date and nature of the emergency, and the name and dose of the medication   prescribed. In the event that your call is answered by a member of our staff, make sure to document and save the date, time, and the name of the person that took your information.  2. Exception #2 (Planned Surgery): In the event that you are scheduled by another doctor or dentist to have any type of surgery or procedure, you are allowed (for a period no longer than 30 days), to receive additional pain medication, for the acute post-op pain. However, in this case, you are responsible for picking up a copy of our "Post-op Pain Management for Surgeons" handout, and giving it to your surgeon or dentist. This document is available at our office, and does not require an appointment to obtain it. Simply go to our office during business hours (Monday-Thursday from 8:00 AM to 4:00 PM) (Friday 8:00 AM to 12:00 Noon) or if you have a scheduled appointment with us, prior to your surgery, and ask for it by name. In addition, you will need to provide us with your name, name of your surgeon, type of surgery, and date of procedure or surgery.  *Opioid medications include: morphine, codeine, oxycodone, oxymorphone, hydrocodone, hydromorphone, meperidine, tramadol, tapentadol, buprenorphine, fentanyl, methadone. **Benzodiazepine medications include: diazepam (Valium), alprazolam (Xanax), clonazepam (Klonopine), lorazepam (Ativan), clorazepate (Tranxene), chlordiazepoxide (Librium), estazolam (Prosom), oxazepam (Serax), temazepam (Restoril), triazolam (Halcion) (Last updated: 03/31/2017) ____________________________________________________________________________________________    

## 2017-09-05 LAB — TOXASSURE SELECT 13 (MW), URINE

## 2017-11-28 ENCOUNTER — Encounter: Payer: Medicare Other | Admitting: Nurse Practitioner

## 2017-12-02 ENCOUNTER — Other Ambulatory Visit: Payer: Self-pay | Admitting: Nurse Practitioner

## 2017-12-13 ENCOUNTER — Ambulatory Visit: Payer: Medicare Other | Attending: Nurse Practitioner | Admitting: Nurse Practitioner

## 2017-12-13 ENCOUNTER — Encounter: Payer: Self-pay | Admitting: Nurse Practitioner

## 2017-12-13 ENCOUNTER — Other Ambulatory Visit: Payer: Self-pay

## 2017-12-13 VITALS — BP 134/104 | HR 95 | Temp 98.2°F | Resp 16 | Ht 64.0 in | Wt 127.0 lb

## 2017-12-13 DIAGNOSIS — M5116 Intervertebral disc disorders with radiculopathy, lumbar region: Secondary | ICD-10-CM | POA: Diagnosis not present

## 2017-12-13 DIAGNOSIS — Z79891 Long term (current) use of opiate analgesic: Secondary | ICD-10-CM | POA: Diagnosis not present

## 2017-12-13 DIAGNOSIS — M4722 Other spondylosis with radiculopathy, cervical region: Secondary | ICD-10-CM | POA: Diagnosis not present

## 2017-12-13 DIAGNOSIS — Z90710 Acquired absence of both cervix and uterus: Secondary | ICD-10-CM | POA: Diagnosis not present

## 2017-12-13 DIAGNOSIS — I10 Essential (primary) hypertension: Secondary | ICD-10-CM | POA: Diagnosis not present

## 2017-12-13 DIAGNOSIS — R4182 Altered mental status, unspecified: Secondary | ICD-10-CM | POA: Insufficient documentation

## 2017-12-13 DIAGNOSIS — M47812 Spondylosis without myelopathy or radiculopathy, cervical region: Secondary | ICD-10-CM | POA: Insufficient documentation

## 2017-12-13 DIAGNOSIS — E559 Vitamin D deficiency, unspecified: Secondary | ICD-10-CM | POA: Diagnosis not present

## 2017-12-13 DIAGNOSIS — M6281 Muscle weakness (generalized): Secondary | ICD-10-CM | POA: Insufficient documentation

## 2017-12-13 DIAGNOSIS — G600 Hereditary motor and sensory neuropathy: Secondary | ICD-10-CM | POA: Diagnosis not present

## 2017-12-13 DIAGNOSIS — F4323 Adjustment disorder with mixed anxiety and depressed mood: Secondary | ICD-10-CM | POA: Diagnosis not present

## 2017-12-13 DIAGNOSIS — M79605 Pain in left leg: Secondary | ICD-10-CM | POA: Insufficient documentation

## 2017-12-13 DIAGNOSIS — Z7982 Long term (current) use of aspirin: Secondary | ICD-10-CM | POA: Diagnosis not present

## 2017-12-13 DIAGNOSIS — M79604 Pain in right leg: Secondary | ICD-10-CM | POA: Diagnosis not present

## 2017-12-13 DIAGNOSIS — Z880 Allergy status to penicillin: Secondary | ICD-10-CM | POA: Diagnosis not present

## 2017-12-13 DIAGNOSIS — M51369 Other intervertebral disc degeneration, lumbar region without mention of lumbar back pain or lower extremity pain: Secondary | ICD-10-CM

## 2017-12-13 DIAGNOSIS — Z820 Family history of epilepsy and other diseases of the nervous system: Secondary | ICD-10-CM | POA: Insufficient documentation

## 2017-12-13 DIAGNOSIS — M25511 Pain in right shoulder: Secondary | ICD-10-CM | POA: Diagnosis not present

## 2017-12-13 DIAGNOSIS — Z791 Long term (current) use of non-steroidal anti-inflammatories (NSAID): Secondary | ICD-10-CM | POA: Diagnosis not present

## 2017-12-13 DIAGNOSIS — Z79899 Other long term (current) drug therapy: Secondary | ICD-10-CM | POA: Diagnosis not present

## 2017-12-13 DIAGNOSIS — Z8249 Family history of ischemic heart disease and other diseases of the circulatory system: Secondary | ICD-10-CM | POA: Diagnosis not present

## 2017-12-13 DIAGNOSIS — M5136 Other intervertebral disc degeneration, lumbar region: Secondary | ICD-10-CM | POA: Diagnosis not present

## 2017-12-13 DIAGNOSIS — G894 Chronic pain syndrome: Secondary | ICD-10-CM

## 2017-12-13 DIAGNOSIS — Z8673 Personal history of transient ischemic attack (TIA), and cerebral infarction without residual deficits: Secondary | ICD-10-CM | POA: Diagnosis not present

## 2017-12-13 DIAGNOSIS — Z87440 Personal history of urinary (tract) infections: Secondary | ICD-10-CM | POA: Insufficient documentation

## 2017-12-13 MED ORDER — GABAPENTIN 100 MG PO CAPS: 200.0000 mg | ORAL_CAPSULE | Freq: Three times a day (TID) | ORAL | 2 refills | Status: DC

## 2017-12-13 MED ORDER — OXYCODONE HCL 5 MG PO TABS: 5.0000 mg | ORAL_TABLET | Freq: Four times a day (QID) | ORAL | 0 refills | Status: DC | PRN

## 2017-12-13 MED ORDER — CALCIUM PLUS D3 ABSORBABLE 600-2500 MG-UNIT PO CAPS: 1.0000 | ORAL_CAPSULE | Freq: Every day | ORAL | 0 refills | Status: DC

## 2017-12-13 NOTE — Patient Instructions (Addendum)
Rx for Oxycodone IR to last until 03/13/2018, Calcium plus D3, and gabapentin have been escribed to your pharmacy.____________________________________________________________________________________________  Medication Rules  Applies to: All patients receiving prescriptions (written or electronic).  Pharmacy of record: Pharmacy where electronic prescriptions will be sent. If written prescriptions are taken to a different pharmacy, please inform the nursing staff. The pharmacy listed in the electronic medical record should be the one where you would like electronic prescriptions to be sent.  Prescription refills: Only during scheduled appointments. Applies to both, written and electronic prescriptions.  NOTE: The following applies primarily to controlled substances (Opioid* Pain Medications).   Patient's responsibilities: 1. Pain Pills: Bring all pain pills to every appointment (except for procedure appointments). 2. Pill Bottles: Bring pills in original pharmacy bottle. Always bring newest bottle. Bring bottle, even if empty. 3. Medication refills: You are responsible for knowing and keeping track of what medications you need refilled. The day before your appointment, write a list of all prescriptions that need to be refilled. Bring that list to your appointment and give it to the admitting nurse. Prescriptions will be written only during appointments. If you forget a medication, it will not be "Called in", "Faxed", or "electronically sent". You will need to get another appointment to get these prescribed. 4. Prescription Accuracy: You are responsible for carefully inspecting your prescriptions before leaving our office. Have the discharge nurse carefully go over each prescription with you, before taking them home. Make sure that your name is accurately spelled, that your address is correct. Check the name and dose of your medication to make sure it is accurate. Check the number of pills, and the  written instructions to make sure they are clear and accurate. Make sure that you are given enough medication to last until your next medication refill appointment. 5. Taking Medication: Take medication as prescribed. Never take more pills than instructed. Never take medication more frequently than prescribed. Taking less pills or less frequently is permitted and encouraged, when it comes to controlled substances (written prescriptions).  6. Inform other Doctors: Always inform, all of your healthcare providers, of all the medications you take. 7. Pain Medication from other Providers: You are not allowed to accept any additional pain medication from any other Doctor or Healthcare provider. There are two exceptions to this rule. (see below) In the event that you require additional pain medication, you are responsible for notifying us, as stated below. 8. Medication Agreement: You are responsible for carefully reading and following our Medication Agreement. This must be signed before receiving any prescriptions from our practice. Safely store a copy of your signed Agreement. Violations to the Agreement will result in no further prescriptions. (Additional copies of our Medication Agreement are available upon request.) 9. Laws, Rules, & Regulations: All patients are expected to follow all [address] and [employer] Laws, [employer], Rules, [place] [employer] [place] [employer] Regulations. Ignorance of the Laws does not constitute a valid excuse. The use of any illegal substances is prohibited. 10. Adopted CDC guidelines & recommendations: Target dosing levels will be at or below 60 MME/day. Use of benzodiazepines** is not recommended.  Exceptions: There are only two exceptions to the rule of not receiving pain medications from other Healthcare Providers. 1. Exception #1 (Emergencies): In the event of an emergency (i.e.: accident requiring emergency care), you are allowed to receive additional pain medication. However, you are responsible for: As soon as you  are able, call our office [phone], at any time of the day or night, and leave a message  stating your name, the date and nature of the emergency, and the name and dose of the medication prescribed. In the event that your call is answered by a member of our staff, make sure to document and save the date, time, and the name of the person that took your information.  2. Exception #2 (Planned Surgery): In the event that you are scheduled by another doctor or dentist to have any type of surgery or procedure, you are allowed (for a period no longer than 30 days), to receive additional pain medication, for the acute post-op pain. However, in this case, you are responsible for picking up a copy of our "Post-op Pain Management for Surgeons" handout, and giving it to your surgeon or dentist. This document is available at our office, and does not require an appointment to obtain it. Simply go to our office during business hours (Monday-Thursday from 8:00 AM to 4:00 PM) (Friday 8:00 AM to 12:00 Noon) or if you have a scheduled appointment with [place], prior to your surgery, and ask for it by name. In addition, you will need to provide [place] with your name, name of your surgeon, type of surgery, and date of procedure or surgery.  *Opioid medications include: morphine, codeine, oxycodone, oxymorphone, hydrocodone, hydromorphone, meperidine, tramadol, tapentadol, buprenorphine, fentanyl, methadone. **Benzodiazepine medications include: diazepam (Valium), alprazolam (Xanax), clonazepam (Klonopine), lorazepam (Ativan), clorazepate (Tranxene), chlordiazepoxide (Librium), estazolam (Prosom), oxazepam (Serax), temazepam (Restoril), triazolam (Halcion) (Last updated: 03/31/2017) ____________________________________________________________________________________________

## 2017-12-13 NOTE — Progress Notes (Signed)
Nursing Pain Medication Assessment:  Safety precautions to be maintained throughout the outpatient stay will include: orient to surroundings, keep bed in low position, maintain call bell within reach at all times, provide assistance with transfer out of bed and ambulation.  Medication Inspection Compliance: Pill count conducted under aseptic conditions, in front of the patient. Neither the pills nor the bottle was removed from the patient's sight at any time. Once count was completed pills were immediately returned to the patient in their original bottle.  Medication: Oxycodone IR Pill/Patch Count: 0 of 120 pills remain Pill/Patch Appearance: Markings consistent with prescribed medication Bottle Appearance: Standard pharmacy container. Clearly labeled. Filled Date: 439 / 28 / 2019 Last Medication intake:  11/28/2017   Pt states she got sick and was not able to make last appt. States this was the first day she was able to get a ride to clinic for an appt.

## 2017-12-13 NOTE — Progress Notes (Signed)
Patient's Name: Maria Wheeler  MRN: [id number]  Referring Provider: Baxter Hire, MD  DOB: 08/14/1955  PCP: Baxter Hire, MD  DOS: 12/13/2017  Note by: Vevelyn Francois NP  Service setting: Ambulatory outpatient  Specialty: Interventional Pain Management  Location: [facility] (AMB) Pain Management Facility    Patient type: Established    Primary Reason(s) for Visit: Encounter for prescription drug management. (Level of risk: moderate)  CC: Leg Pain (bilaterally, includes feet) and Arm Pain (bilaterally, includes hands)  HPI  Maria Wheeler is a 62 y.o. year old, female patient, who comes today for a medication management evaluation. She has Change in mental status; HTN (hypertension), benign; Auditory hallucinations; History of drug dependence ([place]); Benzodiazepine dependence ([place]); Chronic anxiety; Diastolic dysfunction; Multiple falls; UTI (urinary tract infection); Hypokalemia; Depression with anxiety; Benzodiazepine abuse ([place]); Chronic musculoskeletal pain; CMT (Charcot-Marie-Tooth disease); H/O: stroke; SDH (subdural hematoma) ([place]); Disorder of skeletal system; Pharmacologic therapy; Problems influencing health status; Chronic neck pain (Tertiary Area of Pain) (Posterior) (Bilateral) (L>R); DDD (degenerative disc disease), cervical; Cervical spondylosis; Chronic shoulder pain (Right); Chronic upper extremity pain (Left); Chronic pain syndrome; Chronic knee pain (B) (L>R); Chronic lumbar radiculopathy (L5/S1) (Left); Chronic lower extremity pain (Primary Area of Pain) (Bilateral) (L>R); Cervical Grade 1 Retrolisthesis of C5 over C6; Abnormal nerve conduction studies; Chronic upper extremity polyneuropathy (severe) (CMT); 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[place]); Adjustment disorder with mixed anxiety and depressed mood; Chronic upper extremity pain (Secondary Area of Pain) (Bilateral) (L>R); DDD (degenerative disc disease), lumbar; Lumbar facet arthropathy (Bilateral); 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 Her primarily concern today is the Leg Pain (bilaterally, includes feet) and Arm Pain (bilaterally, includes hands)  Pain Assessment: Location: Right, Left Leg Radiating: includes feet; bilateral arm pain includes hands Onset: More than a month ago Duration: Chronic pain Quality: Aching Severity: 10-Worst pain ever/10 (subjective, self-reported pain score)  Note: Reported level is compatible with observation. Clinically the patient looks like a 2/10 A 2/10 is viewed as "Mild to Moderate" and described as noticeable and distracting. Impossible to hide from other people. More frequent flare-ups. Still possible to adapt and function close to normal. It can be very annoying and may have occasional stronger flare-ups. With discipline, patients may get used to it and adapt. Discrepancy may suggest symptom exaggeration. When using our objective Pain Scale, levels between 6 and 10/10 are said to belong in an emergency room, as it progressively worsens from a 6/10, described as severely limiting, requiring emergency care not usually available at an outpatient pain management facility. At a 6/10 level, communication becomes difficult and requires great effort. Assistance to reach the emergency department may be required. Facial flushing and profuse sweating along with potentially dangerous increases in heart rate and blood pressure will be evident. Effect on ADL: limits daily activities; cannot use hands to open things Timing: Constant Modifying factors: meds BP: (!) 134/104  HR: 95  Maria Wheeler was last scheduled for an appointment on 12/02/2017  for medication management. During today's appointment we reviewed Maria Wheeler's chronic pain status, as well as her outpatient medication regimen.  She is rating her pain a 10 out of 10 because she is been without her medication for 2 weeks.  She admits that she was sick  on her last appointment day.  Her blood pressure is elevated.  She admits that she does have a new primary care provider however she ran out of her medication on yesterday.  The patient  reports that she does not use drugs. Her body mass index is 21.8 kg/m.  Further details on both, my assessment(s), as well as the proposed treatment plan, please see below.  Controlled Substance Pharmacotherapy Assessment REMS (Risk Evaluation and Mitigation Strategy)  Analgesic:Oxycodone IR 5 mg 1 tablet by mouth every 6 hours when necessary (50m/dayof oxycodone) MME/day:355mday. [name]  12/13/2017 12:40 PM  Signed Nursing Pain Medication Assessment:  Safety precautions to be maintained throughout the outpatient stay will include: orient to surroundings, keep bed in low position, maintain call bell within reach at all times, provide assistance with transfer out of bed and ambulation.  Medication Inspection Compliance: Pill count conducted under aseptic conditions, in front of the patient. Neither the pills nor the bottle was removed from the patient's sight at any time. Once count was completed pills were immediately returned to the patient in their original bottle.  Medication: Oxycodone IR Pill/Patch Count: 0 of 120 pills remain Pill/Patch Appearance: Markings consistent with prescribed medication Bottle Appearance: Standard pharmacy container. Clearly labeled. Filled Date: 9 45 28 / 2019 Last Medication intake:  11/28/2017   Pt states she got sick and was not able to make last appt. States this was the first day she was able to get a ride to clinic for an appt.   Pharmacokinetics: Liberation and absorption (onset of  action): WNL Distribution (time to peak effect): WNL Metabolism and excretion (duration of action): WNL         Pharmacodynamics: Desired effects: Analgesia: Ms. MuBernsteineports >50% benefit. Functional ability: Patient reports that medication allows her to accomplish basic ADLs Clinically meaningful improvement in function (CMIF): Sustained CMIF goals met Perceived effectiveness: Described as relatively effective, allowing for increase in activities of daily living (ADL) Undesirable effects: Side-effects or Adverse reactions: None reported Monitoring: [place] PMP: Online review of the past 1228-monthriod conducted. Compliant with practice rules and regulations Last UDS on record: Summary  Date Value Ref Range Status  08/30/2017 FINAL  Final    Comment:    ==================================================================== TOXASSURE SELECT 13 (MW) ==================================================================== Test                             Result       Flag       Units Drug Present and Declared for Prescription Verification   Oxycodone                      1004         EXPECTED   ng/mg creat   Oxymorphone                    473          EXPECTED   ng/mg creat   Noroxycodone                   1144         EXPECTED   ng/mg creat   Noroxymorphone                 136          EXPECTED   ng/mg creat    Sources of oxycodone are scheduled prescription medications.  Oxymorphone, noroxycodone, and noroxymorphone are expected    metabolites of oxycodone. Oxymorphone is also available as a    scheduled prescription medication. Drug Present not Declared for Prescription Verification   Lorazepam                      481          UNEXPECTED ng/mg creat    Source of lorazepam is a scheduled prescription medication. ==================================================================== Test                      Result    Flag   Units      Ref Range   Creatinine              213               mg/dL      >=20 ==================================================================== Declared Medications:  The flagging and interpretation on this report are based on the  following declared medications.  Unexpected results may arise from  inaccuracies in the declared medications.  **Note: The testing scope of this panel includes these medications:  Oxycodone  **Note: The testing scope of this panel does not include following  reported medications:  Amlodipine Besylate  Aspirin (Aspirin 81)  Atenolol  Calcium carbonate (Calcium carbonate/Vitamin D3)  Gabapentin  Ibuprofen  Lisinopril  Vitamin D3 (Calcium carbonate/Vitamin D3) ==================================================================== For clinical consultation, please call [phone]. ====================================================================    UDS interpretation: Compliant          Medication Assessment Form: Reviewed. Patient indicates being compliant with therapy Treatment compliance: Compliant Risk Assessment Profile: Aberrant behavior: See prior evaluations. None observed or detected today Comorbid factors increasing risk of overdose: See prior notes. No additional risks detected today Opioid risk tool (ORT) (Total Score): 0 Personal History of Substance Abuse (SUD-Substance use disorder):  Alcohol: Negative  Illegal Drugs: Negative  Rx Drugs: Negative  ORT Risk Level calculation: Low Risk Risk of substance use disorder (SUD): Low Opioid Risk Tool - 12/13/17 1152      Family History of Substance Abuse   Alcohol  Negative    Illegal Drugs  Negative    Rx Drugs  Negative      Personal History of Substance Abuse   Alcohol  Negative    Illegal Drugs  Negative    Rx Drugs  Negative      Age   Age between 53-45 years   No      History of Preadolescent Sexual Abuse   History of Preadolescent Sexual Abuse  Negative or Female      Psychological Disease   Psychological Disease  Negative     Depression  Negative      Total Score   Opioid Risk Tool Scoring  0    Opioid Risk Interpretation  Low Risk      ORT Scoring interpretation table:  Score <3 = Low Risk for SUD  Score between 4-7 = Moderate Risk for SUD  Score >8 = High Risk for Opioid Abuse   Risk Mitigation Strategies:  Patient Counseling: Covered Patient-Prescriber Agreement (PPA): Present and active  Notification to other healthcare providers: Done  Pharmacologic Plan: No change in therapy, at this time.             Laboratory Chemistry  Inflammation Markers (CRP: Acute Phase) (ESR: Chronic Phase) Lab Results  Component Value Date   CRP 1.2 11/10/2016   ESRSEDRATE 5 11/10/2016  Rheumatology Markers No results found for: RF, ANA, LABURIC, URICUR, LYMEIGGIGMAB, LYMEABIGMQN, HLAB27                      Renal Function Markers Lab Results  Component Value Date   BUN 18 11/10/2016   CREATININE 0.87 11/10/2016   BCR 21 11/10/2016   GFRAA 83 11/10/2016   GFRNONAA 72 11/10/2016                             Hepatic Function Markers Lab Results  Component Value Date   AST 11 11/10/2016   ALT 13 (L) 12/31/2015   ALBUMIN 4.2 11/10/2016   ALKPHOS 81 11/10/2016   LIPASE 33 07/16/2014                        Electrolytes Lab Results  Component Value Date   NA 141 11/10/2016   K 3.8 11/10/2016   CL 100 11/10/2016   CALCIUM 9.3 11/10/2016   MG 2.0 11/10/2016                        Neuropathy Markers Lab Results  Component Value Date   VITAMINB12 615 11/10/2016                        CNS Tests No results found for: COLORCSF, APPEARCSF, RBCCOUNTCSF, WBCCSF, POLYSCSF, LYMPHSCSF, EOSCSF, PROTEINCSF, GLUCCSF, JCVIRUS, CSFOLI, IGGCSF                      Bone Pathology Markers Lab Results  Component Value Date   VD25OH 13.5 (L) 12/31/2015   25OHVITD1 13 (L) 11/10/2016   25OHVITD2 <1.0 11/10/2016   25OHVITD3 13 11/10/2016                         Coagulation Parameters Lab  Results  Component Value Date   INR 1.0 12/30/2013   LABPROT 13.2 12/30/2013   APTT 28.9 12/30/2013   PLT 214 12/31/2015                        Cardiovascular Markers Lab Results  Component Value Date   CKTOTAL 36 (L) 12/31/2015   TROPONINI <0.03 12/31/2015   HGB 13.5 12/31/2015   HCT 39.3 12/31/2015                         CA Markers No results found for: CEA, CA125, LABCA2                      Note: Lab results reviewed.  Recent Diagnostic Imaging Results  DG C-Arm 1-60 Min-No Report Fluoroscopy was utilized by the requesting physician.  No radiographic  interpretation.   Complexity Note: Imaging results reviewed. Results shared with Maria Wheeler, using Layman's terms.                         Meds   Current Outpatient Medications:  .  amLODipine (NORVASC) 5 MG tablet, Take by mouth., Disp: , Rfl:  .  aspirin EC 81 MG tablet, Take 81 mg by mouth daily., Disp: , Rfl:  .  atenolol (TENORMIN) 100 MG tablet, Take by mouth daily. , Disp: , Rfl:  .  Calcium Carb-Cholecalciferol (CALCIUM PLUS  D3 ABSORBABLE) [phone] MG-UNIT CAPS, Take 1 capsule by mouth daily with breakfast., Disp: 90 capsule, Rfl: 0 .  gabapentin (NEURONTIN) 100 MG capsule, Take 2 capsules (200 mg total) by mouth 3 (three) times daily., Disp: 180 capsule, Rfl: 2 .  ibuprofen (ADVIL,MOTRIN) 200 MG tablet, Take 200 mg by mouth every 6 (six) hours as needed., Disp: , Rfl:  .  lisinopril (PRINIVIL,ZESTRIL) 10 MG tablet, Take 10 mg by mouth. , Disp: , Rfl:  .  [START ON 02/11/2018] oxyCODONE (OXY IR/ROXICODONE) 5 MG immediate release tablet, Take 1 tablet (5 mg total) by mouth every 6 (six) hours as needed for severe pain., Disp: 120 tablet, Rfl: 0 .  [START ON 01/12/2018] oxyCODONE (OXY IR/ROXICODONE) 5 MG immediate release tablet, Take 1 tablet (5 mg total) by mouth every 6 (six) hours as needed for severe pain., Disp: 120 tablet, Rfl: 0 .  oxyCODONE (OXY IR/ROXICODONE) 5 MG immediate release tablet, Take 1 tablet (5  mg total) by mouth every 6 (six) hours as needed for severe pain., Disp: 120 tablet, Rfl: 0  ROS  Constitutional: Denies any fever or chills Gastrointestinal: No reported hemesis, hematochezia, vomiting, or acute GI distress Musculoskeletal: Denies any acute onset joint swelling, redness, loss of ROM, or weakness Neurological: No reported episodes of acute onset apraxia, aphasia, dysarthria, agnosia, amnesia, paralysis, loss of coordination, or loss of consciousness  Allergies  Maria Wheeler is allergic to penicillins.  [place]  Drug: Maria Wheeler  reports that she does not use drugs. Alcohol:  reports that she drinks about 1.0 standard drinks of alcohol per week. Tobacco:  reports that she has never smoked. She has never used smokeless tobacco. Medical:  has a past medical history of Abdominal pain (12/22/2012), Abnormal EKG (12/11/2011), Allergy, Altered mental status (12/11/2011), Anxiety, Benzodiazepine dependence ([place]) (12/14/2011), Charcot-Marie disease, Chronic anxiety (12/14/2011), Dehydration (35/04/6142), Diastolic dysfunction (31/54/0086), Drug withdrawal ([place]) (12/12/2011), Hypertension, Hypokalemia (12/11/2011), and Psychosis ([place]) (12/12/2011). Surgical: Maria Wheeler  has a past surgical history that includes Abdominal hysterectomy. Family: family history includes Charcot-Marie-Tooth disease in her mother and son; Heart disease in her father.  Constitutional Exam  General appearance: Well nourished, well developed, and well hydrated. In no apparent acute distress Vitals:   12/13/17 1141  BP: (!) 134/104  Pulse: 95  Resp: 16  Temp: 98.2 F (36.8 C)  TempSrc: Oral  SpO2: 100%  Weight: 127 lb (57.6 kg)  Height: '5\' 4"'  (1.626 m)   BMI Assessment: Estimated body mass index is 21.8 kg/m as calculated from the following:   Height as of this encounter: '5\' 4"'  (1.626 m).   Weight as of this encounter: 127 lb (57.6 kg). Psych/Mental status: Alert, oriented x 3 (person, place, & time)        Eyes: PERLA Respiratory: No evidence of acute respiratory distress  Cervical Spine Area Exam  Skin & Axial Inspection: No masses, redness, edema, swelling, or associated skin lesions Alignment: Symmetrical Functional ROM: Unrestricted ROM      Stability: No instability detected Muscle Tone/Strength: Functionally intact. No obvious neuro-muscular anomalies detected. Sensory (Neurological): Unimpaired Palpation: No palpable anomalies              Upper Extremity (UE) Exam    Side: Right upper extremity  Side: Left upper extremity  Skin & Extremity Inspection: Skin color, temperature, and hair growth are WNL. No peripheral edema or cyanosis. No masses, redness, swelling, asymmetry, or associated skin lesions. No contractures.  Skin & Extremity Inspection: Skin color, temperature, and hair  growth are WNL. No peripheral edema or cyanosis. No masses, redness, swelling, asymmetry, or associated skin lesions. No contractures.  Functional ROM: Unrestricted ROM          Functional ROM: Unrestricted ROM          Muscle Tone/Strength: Functionally intact. No obvious neuro-muscular anomalies detected.  Muscle Tone/Strength: Functionally intact. No obvious neuro-muscular anomalies detected.  Sensory (Neurological): Unimpaired          Sensory (Neurological): Unimpaired          Palpation: No palpable anomalies              Palpation: No palpable anomalies              Provocative Test(s):  Phalen's test: deferred Tinel's test: deferred Apley's scratch test (touch opposite shoulder):  Action 1 (Across chest): deferred Action 2 (Overhead): deferred Action 3 (LB reach): deferred   Provocative Test(s):  Phalen's test: deferred Tinel's test: deferred Apley's scratch test (touch opposite shoulder):  Action 1 (Across chest): deferred Action 2 (Overhead): deferred Action 3 (LB reach): deferred    Thoracic Spine Area Exam  Skin & Axial Inspection: No masses, redness, or swelling Alignment:  Symmetrical Functional ROM: Unrestricted ROM Stability: No instability detected Muscle Tone/Strength: Functionally intact. No obvious neuro-muscular anomalies detected. Sensory (Neurological): Unimpaired Muscle strength & Tone: No palpable anomalies  Lumbar Spine Area Exam  Skin & Axial Inspection: No masses, redness, or swelling Alignment: Symmetrical Functional ROM: Unrestricted ROM       Stability: No instability detected Muscle Tone/Strength: Functionally intact. No obvious neuro-muscular anomalies detected. Sensory (Neurological): Unimpaired Palpation: No palpable anomalies       Provocative Tests: Hyperextension/rotation test: deferred today       Lumbar quadrant test (Kemp's test): deferred today       Lateral bending test: deferred today       Patrick's Maneuver: deferred today                   FABER test: deferred today                   S-I anterior distraction/compression test: deferred today         S-I lateral compression test: deferred today         S-I Thigh-thrust test: deferred today         S-I Gaenslen's test: deferred today          Gait & Posture Assessment  Ambulation: Unassisted Gait: Relatively normal for age and body habitus Posture: WNL   Lower Extremity Exam    Side: Right lower extremity  Side: Left lower extremity  Stability: No instability observed          Stability: No instability observed          Skin & Extremity Inspection: Skin color, temperature, and hair growth are WNL. No peripheral edema or cyanosis. No masses, redness, swelling, asymmetry, or associated skin lesions. No contractures.  Skin & Extremity Inspection: Skin color, temperature, and hair growth are WNL. No peripheral edema or cyanosis. No masses, redness, swelling, asymmetry, or associated skin lesions. No contractures.  Functional ROM: Unrestricted ROM                  Functional ROM: Unrestricted ROM                  Muscle Tone/Strength: Functionally intact. No obvious  neuro-muscular anomalies detected.  Muscle Tone/Strength: Functionally intact. No obvious neuro-muscular  anomalies detected.  Sensory (Neurological): Unimpaired  Sensory (Neurological): Unimpaired  Palpation: No palpable anomalies  Palpation: No palpable anomalies   Assessment  Primary Diagnosis & Pertinent Problem List: The primary encounter diagnosis was Cervical spondylosis. Diagnoses of CMT (Charcot-Marie-Tooth disease), DDD (degenerative disc disease), lumbar, Vitamin D deficiency, and Chronic pain syndrome were also pertinent to this visit.  Status Diagnosis  Persistent Persistent Persistent 1. Cervical spondylosis   2. CMT (Charcot-Marie-Tooth disease)   3. DDD (degenerative disc disease), lumbar   4. Vitamin D deficiency   5. Chronic pain syndrome     Problems updated and reviewed during this visit: Problem  Change in Mental Status   Overview:  Frequent bouts of mental status changes, often due to uti, electrolyte abnormalities and/or benzo abuse or withdrawal.     Plan of Care  Pharmacotherapy (Medications Ordered): Meds ordered this encounter  Medications  . Calcium Carb-Cholecalciferol (CALCIUM PLUS D3 ABSORBABLE) [phone] MG-UNIT CAPS    Sig: Take 1 capsule by mouth daily with breakfast.    Dispense:  90 capsule    Refill:  0    Do not place medication on "Automatic Refill". Fill one day early if pharmacy is closed on scheduled refill date.    Order Specific Question:   Supervising Provider    Answer:   Milinda Pointer [phone]  . DISCONTD: gabapentin (NEURONTIN) 100 MG capsule    Sig: Take 2 capsules (200 mg total) by mouth 3 (three) times daily.    Dispense:  180 capsule    Refill:  2    Do not place medication on "Automatic Refill". Fill one day early if pharmacy is closed on scheduled refill date.    Order Specific Question:   Supervising Provider    Answer:   Milinda Pointer [phone]  . DISCONTD: oxyCODONE (OXY IR/ROXICODONE) 5 MG immediate release  tablet    Sig: Take 1 tablet (5 mg total) by mouth every 6 (six) hours as needed for severe pain.    Dispense:  120 tablet    Refill:  0    Do not place this medication, or any other prescription from our practice, on "Automatic Refill". Patient may have prescription filled one day early if pharmacy is closed on scheduled refill date. Do not fill until:10/29/2017 To last until:11/28/2017    Order Specific Question:   Supervising Provider    Answer:   Milinda Pointer [phone]  . DISCONTD: oxyCODONE (OXY IR/ROXICODONE) 5 MG immediate release tablet    Sig: Take 1 tablet (5 mg total) by mouth every 6 (six) hours as needed for severe pain.    Dispense:  120 tablet    Refill:  0    Do not place this medication, or any other prescription from our practice, on "Automatic Refill". Patient may have prescription filled one day early if pharmacy is closed on scheduled refill date. Do not fill until: 09/29/2017 To last until: 10/29/2017    Order Specific Question:   Supervising Provider    Answer:   Milinda Pointer [phone]  . DISCONTD: oxyCODONE (OXY IR/ROXICODONE) 5 MG immediate release tablet    Sig: Take 1 tablet (5 mg total) by mouth every 6 (six) hours as needed for severe pain.    Dispense:  120 tablet    Refill:  0    Do not place this medication, or any other prescription from our practice, on "Automatic Refill". Patient may have prescription filled one day early if pharmacy is closed on scheduled refill date. Do not  fill until:08/30/2017 To last until:09/29/2017    Order Specific Question:   Supervising Provider    Answer:   Milinda Pointer [phone]  . oxyCODONE (OXY IR/ROXICODONE) 5 MG immediate release tablet    Sig: Take 1 tablet (5 mg total) by mouth every 6 (six) hours as needed for severe pain.    Dispense:  120 tablet    Refill:  0    Do not place this medication, or any other prescription from our practice, on "Automatic Refill". Patient may have prescription filled one day  early if pharmacy is closed on scheduled refill date.    Order Specific Question:   Supervising Provider    Answer:   Milinda Pointer [phone]  . oxyCODONE (OXY IR/ROXICODONE) 5 MG immediate release tablet    Sig: Take 1 tablet (5 mg total) by mouth every 6 (six) hours as needed for severe pain.    Dispense:  120 tablet    Refill:  0    Do not place this medication, or any other prescription from our practice, on "Automatic Refill". Patient may have prescription filled one day early if pharmacy is closed on scheduled refill date.    Order Specific Question:   Supervising Provider    Answer:   Milinda Pointer [phone]  . oxyCODONE (OXY IR/ROXICODONE) 5 MG immediate release tablet    Sig: Take 1 tablet (5 mg total) by mouth every 6 (six) hours as needed for severe pain.    Dispense:  120 tablet    Refill:  0    Do not place this medication, or any other prescription from our practice, on "Automatic Refill". Patient may have prescription filled one day early if pharmacy is closed on scheduled refill date.    Order Specific Question:   Supervising Provider    Answer:   Milinda Pointer [phone]  . gabapentin (NEURONTIN) 100 MG capsule    Sig: Take 2 capsules (200 mg total) by mouth 3 (three) times daily.    Dispense:  180 capsule    Refill:  2    Do not place medication on "Automatic Refill". Fill one day early if pharmacy is closed on scheduled refill date.    Order Specific Question:   Supervising Provider    Answer:   Milinda Pointer [[id number]]   New Prescriptions   No medications on file   Medications administered today: [facility]" had no medications administered during this visit. Lab-work, procedure(s), and/or referral(s): No orders of the defined types were placed in this encounter.  Imaging and/or referral(s): None  Interventional therapies: Planned, scheduled, and/or pending: Not at this time.     Considering: Diagnostic right-sided L5-S1  lumbar epidural steroid injection Diagnostic bilateral lumbar facet block Possiblebilateral lumbar facet RFA Diagnostic left sided cervical epidural steroid injection  Diagnostic bilateral cervical facet block Possible bilateral cervical facet RFA Diagnostic right shoulder injection Diagnostic right suprascapular nerve block Possible right sided suprascapular nerve RFA   Palliative PRN treatment(s): Diagnostic left sided cervical epidural steroid injection    Provider-requested follow-up: Return in about 3 months (around 03/15/2018) for MedMgmt.  Future Appointments  Date Time Provider [place]  03/13/2018 11:00 AM Vevelyn Francois, NP [facility] None   Primary Care Physician: Baxter Hire, MD Location: [facility] Note by: Vevelyn Francois NP Date: 12/13/2017; Time: 3:46 PM  Pain Score Disclaimer: We use the NRS-11 scale. This is a self-reported, subjective measurement of pain severity with only modest accuracy. It is  used primarily to identify changes within a particular patient. It must be understood that outpatient pain scales are significantly less accurate that those used for research, where they can be applied under ideal controlled circumstances with minimal exposure to variables. In reality, the score is likely to be a combination of pain intensity and pain affect, where pain affect describes the degree of emotional arousal or changes in action readiness caused by the sensory experience of pain. Factors such as social and work situation, setting, emotional state, anxiety levels, expectation, and prior pain experience may influence pain perception and show large inter-individual differences that may also be affected by time variables.  Patient instructions provided during this appointment: Patient Instructions  Rx for Oxycodone IR to last until 03/13/2018, Calcium plus D3, and gabapentin have been escribed to your  pharmacy.____________________________________________________________________________________________  Medication Rules  Applies to: All patients receiving prescriptions (written or electronic).  Pharmacy of record: Pharmacy where electronic prescriptions will be sent. If written prescriptions are taken to a different pharmacy, please inform the nursing staff. The pharmacy listed in the electronic medical record should be the one where you would like electronic prescriptions to be sent.  Prescription refills: Only during scheduled appointments. Applies to both, written and electronic prescriptions.  NOTE: The following applies primarily to controlled substances (Opioid* Pain Medications).   Patient's responsibilities: 1. Pain Pills: Bring all pain pills to every appointment (except for procedure appointments). 2. Pill Bottles: Bring pills in original pharmacy bottle. Always bring newest bottle. Bring bottle, even if empty. 3. Medication refills: You are responsible for knowing and keeping track of what medications you need refilled. The day before your appointment, write a list of all prescriptions that need to be refilled. Bring that list to your appointment and give it to the admitting nurse. Prescriptions will be written only during appointments. If you forget a medication, it will not be "Called in", "Faxed", or "electronically sent". You will need to get another appointment to get these prescribed. 4. Prescription Accuracy: You are responsible for carefully inspecting your prescriptions before leaving our office. Have the discharge nurse carefully go over each prescription with you, before taking them home. Make sure that your name is accurately spelled, that your address is correct. Check the name and dose of your medication to make sure it is accurate. Check the number of pills, and the written instructions to make sure they are clear and accurate. Make sure that you are given enough  medication to last until your next medication refill appointment. 5. Taking Medication: Take medication as prescribed. Never take more pills than instructed. Never take medication more frequently than prescribed. Taking less pills or less frequently is permitted and encouraged, when it comes to controlled substances (written prescriptions).  6. Inform other Doctors: Always inform, all of your healthcare providers, of all the medications you take. 7. Pain Medication from other Providers: You are not allowed to accept any additional pain medication from any other Doctor or Healthcare provider. There are two exceptions to this rule. (see below) In the event that you require additional pain medication, you are responsible for notifying us, as stated below. 8. Medication Agreement: You are responsible for carefully reading and following our Medication Agreement. This must be signed before receiving any prescriptions from our practice. Safely store a copy of your signed Agreement. Violations to the Agreement will result in no further prescriptions. (Additional copies of our Medication Agreement are available upon request.) 9. Laws, Rules, & Regulations: All patients are expected  to follow all Federal and [employer], Rules, & Regulations. Ignorance of the Laws does not constitute a valid excuse. The use of any illegal substances is prohibited. 10. Adopted CDC guidelines & recommendations: Target dosing levels will be at or below 60 MME/day. Use of benzodiazepines** is not recommended.  Exceptions: There are only two exceptions to the rule of not receiving pain medications from other Healthcare Providers. 1. Exception #1 (Emergencies): In the event of an emergency (i.e.: accident requiring emergency care), you are allowed to receive additional pain medication. However, you are responsible for: As soon as you are able, call our office [phone], at any time of the day or night, and leave a message  stating your name, the date and nature of the emergency, and the name and dose of the medication prescribed. In the event that your call is answered by a member of our staff, make sure to document and save the date, time, and the name of the person that took your information.  2. Exception #2 (Planned Surgery): In the event that you are scheduled by another doctor or dentist to have any type of surgery or procedure, you are allowed (for a period no longer than 30 days), to receive additional pain medication, for the acute post-op pain. However, in this case, you are responsible for picking up a copy of our "Post-op Pain Management for Surgeons" handout, and giving it to your surgeon or dentist. This document is available at our office, and does not require an appointment to obtain it. Simply go to our office during business hours (Monday-Thursday from 8:00 AM to 4:00 PM) (Friday 8:00 AM to 12:00 Noon) or if you have a scheduled appointment with [place], prior to your surgery, and ask for it by name. In addition, you will need to provide [place] with your name, name of your surgeon, type of surgery, and date of procedure or surgery.  *Opioid medications include: morphine, codeine, oxycodone, oxymorphone, hydrocodone, hydromorphone, meperidine, tramadol, tapentadol, buprenorphine, fentanyl, methadone. **Benzodiazepine medications include: diazepam (Valium), alprazolam (Xanax), clonazepam (Klonopine), lorazepam (Ativan), clorazepate (Tranxene), chlordiazepoxide (Librium), estazolam (Prosom), oxazepam (Serax), temazepam (Restoril), triazolam (Halcion) (Last updated: 03/31/2017) ____________________________________________________________________________________________

## 2018-02-06 ENCOUNTER — Other Ambulatory Visit: Payer: Self-pay | Admitting: Nurse Practitioner

## 2018-02-06 ENCOUNTER — Telehealth: Payer: Self-pay | Admitting: Nurse Practitioner

## 2018-02-06 MED ORDER — OXYCODONE HCL 5 MG PO TABS: 5.0000 mg | ORAL_TABLET | Freq: Four times a day (QID) | ORAL | 0 refills | Status: DC | PRN

## 2018-02-06 NOTE — Telephone Encounter (Signed)
Patient called stating [facility] closed on her and she cannot get script for oxycodone on 02-11-18. She is moving to [place]. Can her script be sent to [facility]

## 2018-02-07 NOTE — Telephone Encounter (Signed)
[facility] I did this yesterday

## 2018-02-07 NOTE — Telephone Encounter (Signed)
Ok, im sorry, it did not show up in my note. Thank you

## 2018-03-13 ENCOUNTER — Encounter: Payer: Self-pay | Admitting: Nurse Practitioner

## 2018-03-13 ENCOUNTER — Ambulatory Visit: Payer: Medicare Other | Attending: Nurse Practitioner | Admitting: Nurse Practitioner

## 2018-03-13 ENCOUNTER — Other Ambulatory Visit: Payer: Self-pay

## 2018-03-13 VITALS — BP 153/82 | HR 57 | Temp 98.3°F | Ht 64.0 in | Wt 130.0 lb

## 2018-03-13 DIAGNOSIS — G8929 Other chronic pain: Secondary | ICD-10-CM | POA: Diagnosis not present

## 2018-03-13 DIAGNOSIS — Z79899 Other long term (current) drug therapy: Secondary | ICD-10-CM | POA: Insufficient documentation

## 2018-03-13 DIAGNOSIS — E559 Vitamin D deficiency, unspecified: Secondary | ICD-10-CM | POA: Diagnosis not present

## 2018-03-13 DIAGNOSIS — G600 Hereditary motor and sensory neuropathy: Secondary | ICD-10-CM | POA: Insufficient documentation

## 2018-03-13 DIAGNOSIS — Z79891 Long term (current) use of opiate analgesic: Secondary | ICD-10-CM | POA: Insufficient documentation

## 2018-03-13 DIAGNOSIS — G894 Chronic pain syndrome: Secondary | ICD-10-CM | POA: Diagnosis not present

## 2018-03-13 DIAGNOSIS — M4722 Other spondylosis with radiculopathy, cervical region: Secondary | ICD-10-CM | POA: Diagnosis not present

## 2018-03-13 DIAGNOSIS — M7918 Myalgia, other site: Secondary | ICD-10-CM | POA: Diagnosis not present

## 2018-03-13 MED ORDER — OXYCODONE HCL 5 MG PO TABS: 5.0000 mg | ORAL_TABLET | Freq: Four times a day (QID) | ORAL | 0 refills | Status: DC | PRN

## 2018-03-13 MED ORDER — CALCIUM PLUS D3 ABSORBABLE 600-2500 MG-UNIT PO CAPS: 1.0000 | ORAL_CAPSULE | Freq: Every day | ORAL | 0 refills | Status: DC

## 2018-03-13 MED ORDER — GABAPENTIN 100 MG PO CAPS: 200.0000 mg | ORAL_CAPSULE | Freq: Three times a day (TID) | ORAL | 2 refills | Status: DC

## 2018-03-13 NOTE — Patient Instructions (Signed)
____________________________________________________________________________________________  Medication Rules  Purpose: To inform patients, and their family members, of our rules and regulations.  Applies to: All patients receiving prescriptions (written or electronic).  Pharmacy of record: Pharmacy where electronic prescriptions will be sent. If written prescriptions are taken to a different pharmacy, please inform the nursing staff. The pharmacy listed in the electronic medical record should be the one where you would like electronic prescriptions to be sent.  Electronic prescriptions: In compliance with the [place] Strengthen Opioid Misuse Prevention (STOP) Act of 2017 (Session Law 2017-74/H243), effective February 01, 2018, all controlled substances must be electronically prescribed. Calling prescriptions to the pharmacy will cease to exist.  Prescription refills: Only during scheduled appointments. Applies to all prescriptions.  NOTE: The following applies primarily to controlled substances (Opioid* Pain Medications).   Patient's responsibilities: 1. Pain Pills: Bring all pain pills to every appointment (except for procedure appointments). 2. Pill Bottles: Bring pills in original pharmacy bottle. Always bring the newest bottle. Bring bottle, even if empty. 3. Medication refills: You are responsible for knowing and keeping track of what medications you take and those you need refilled. The day before your appointment: write a list of all prescriptions that need to be refilled. The day of the appointment: give the list to the admitting nurse. Prescriptions will be written only during appointments. No prescriptions will be written on procedure days. If you forget a medication: it will not be "Called in", "Faxed", or "electronically sent". You will need to get another appointment to get these prescribed. No early refills. Do not call asking to have your prescription filled  early. 4. Prescription Accuracy: You are responsible for carefully inspecting your prescriptions before leaving our office. Have the discharge nurse carefully go over each prescription with you, before taking them home. Make sure that your name is accurately spelled, that your address is correct. Check the name and dose of your medication to make sure it is accurate. Check the number of pills, and the written instructions to make sure they are clear and accurate. Make sure that you are given enough medication to last until your next medication refill appointment. 5. Taking Medication: Take medication as prescribed. When it comes to controlled substances, taking less pills or less frequently than prescribed is permitted and encouraged. Never take more pills than instructed. Never take medication more frequently than prescribed.  6. Inform other Doctors: Always inform, all of your healthcare providers, of all the medications you take. 7. Pain Medication from other Providers: You are not allowed to accept any additional pain medication from any other Doctor or Healthcare provider. There are two exceptions to this rule. (see below) In the event that you require additional pain medication, you are responsible for notifying us, as stated below. 8. Medication Agreement: You are responsible for carefully reading and following our Medication Agreement. This must be signed before receiving any prescriptions from our practice. Safely store a copy of your signed Agreement. Violations to the Agreement will result in no further prescriptions. (Additional copies of our Medication Agreement are available upon request.) 9. Laws, Rules, & Regulations: All patients are expected to follow all Federal and State Laws, Statutes, Rules, & Regulations. Ignorance of the Laws does not constitute a valid excuse. The use of any illegal substances is prohibited. 10. Adopted CDC guidelines & recommendations: Target dosing levels will be  at or below 60 MME/day. Use of benzodiazepines** is not recommended.  Exceptions: There are only two exceptions to the rule of not   receiving pain medications from other Healthcare Providers. 1. Exception #1 (Emergencies): In the event of an emergency (i.e.: accident requiring emergency care), you are allowed to receive additional pain medication. However, you are responsible for: As soon as you are able, call our office [phone], at any time of the day or night, and leave a message stating your name, the date and nature of the emergency, and the name and dose of the medication prescribed. In the event that your call is answered by a member of our staff, make sure to document and save the date, time, and the name of the person that took your information.  2. Exception #2 (Planned Surgery): In the event that you are scheduled by another doctor or dentist to have any type of surgery or procedure, you are allowed (for a period no longer than 30 days), to receive additional pain medication, for the acute post-op pain. However, in this case, you are responsible for picking up a copy of our "Post-op Pain Management for Surgeons" handout, and giving it to your surgeon or dentist. This document is available at our office, and does not require an appointment to obtain it. Simply go to our office during business hours (Monday-Thursday from 8:00 AM to 4:00 PM) (Friday 8:00 AM to 12:00 Noon) or if you have a scheduled appointment with us, prior to your surgery, and ask for it by name. In addition, you will need to provide us with your name, name of your surgeon, type of surgery, and date of procedure or surgery.  *Opioid medications include: morphine, codeine, oxycodone, oxymorphone, hydrocodone, hydromorphone, meperidine, tramadol, tapentadol, buprenorphine, fentanyl, methadone. **Benzodiazepine medications include: diazepam (Valium), alprazolam (Xanax), clonazepam (Klonopine), lorazepam (Ativan), clorazepate  (Tranxene), chlordiazepoxide (Librium), estazolam (Prosom), oxazepam (Serax), temazepam (Restoril), triazolam (Halcion) (Last updated: 03/31/2017) ____________________________________________________________________________________________    

## 2018-03-13 NOTE — Progress Notes (Signed)
Nursing Pain Medication Assessment:  Safety precautions to be maintained throughout the outpatient stay will include: orient to surroundings, keep bed in low position, maintain call bell within reach at all times, provide assistance with transfer out of bed and ambulation.  Medication Inspection Compliance: Pill count conducted under aseptic conditions, in front of the patient. Neither the pills nor the bottle was removed from the patient's sight at any time. Once count was completed pills were immediately returned to the patient in their original bottle.  Medication: Oxycodone IR Pill/Patch Count: 0 of 120 pills remain Pill/Patch Appearance: Markings consistent with prescribed medication Bottle Appearance: Standard pharmacy container. Clearly labeled. Filled Date: 1 / 37 / 2020 Last Medication intake:  Today

## 2018-03-13 NOTE — Progress Notes (Signed)
Patient's Name: Maria Wheeler  MRN: [id number]  Referring Provider: Baxter Hire, MD  DOB: 07/22/1955  PCP: Baxter Hire, MD  DOS: 03/13/2018  Note by: Dionisio David, NP  Service setting: Ambulatory outpatient  Specialty: Interventional Pain Management  Location: [facility] (AMB) Pain Management Facility    Patient type: Established   HPI  Reason for Visit: [name] is a 63 y.o. year old, female patient, who comes today with a chief complaint of Leg Pain Last Appointment: She was last seen by me on 02/06/2018. Pain Assessment: Today, [name] describes the severity of the Chronic pain as a 10-Worst pain ever/10. She indicates the location/referral of the pain to be Leg Right, Left/pain radiaties up feet to legs and hands hurts all the times. Onset was: More than a month ago. The quality of pain is described as Aching. Temporal description, or timing of pain is: Constant. Possible modifying factors: medications help just a little. [name] describes the pain effects on ADL as: limits my daily activities, make me weak, unable to walk at times due to the pain, unable to sleep at night due to pain.  [name]  height is _0  (1.626 m) and weight is 130 lb (59 kg). Her temperature is 98.3 F (36.8 C). Her blood pressure is 153/82 (abnormal) and her pulse is 57 (abnormal). Her oxygen saturation is 100%.  She admits that her pain is getting worse. She has little strength. She feels like heat helps her pain.  He is very discouraged at her future.  She frequently reminds me of how her mother did with the same condition. She is concerned about her son and granddaughter.  He does continue to follow-up with neurology however he is just monitoring no additional testing or treatment at this time.  Controlled Substance Pharmacotherapy Assessment REMS (Risk Evaluation and Mitigation Strategy)  Analgesic:Oxycodone IR 5 mg 1 tablet by mouth every 6 hours when necessary (89m/dayof  oxycodone) MME/day:391mday. BrChauncey FischerRN  03/13/2018 11:22 AM  Sign when Signing Visit Nursing Pain Medication Assessment:  Safety precautions to be maintained throughout the outpatient stay will include: orient to surroundings, keep bed in low position, maintain call bell within reach at all times, provide assistance with transfer out of bed and ambulation.  Medication Inspection Compliance: Pill count conducted under aseptic conditions, in front of the patient. Neither the pills nor the bottle was removed from the patient's sight at any time. Once count was completed pills were immediately returned to the patient in their original bottle.  Medication: Oxycodone IR Pill/Patch Count: 0 of 120 pills remain Pill/Patch Appearance: Markings consistent with prescribed medication Bottle Appearance: Standard pharmacy container. Clearly labeled. Filled Date: 1 / 1127 2020 Last Medication intake:  Today   Pharmacokinetics: Liberation and absorption (onset of action): WNL Distribution (time to peak effect): WNL Metabolism and excretion (duration of action): WNL         Pharmacodynamics: Desired effects: Analgesia: [name] >50% benefit. Functional ability: Patient reports that medication allows her to accomplish basic ADLs Clinically meaningful improvement in function (CMIF): Sustained CMIF goals met Perceived effectiveness: Described as relatively effective, allowing for increase in activities of daily living (ADL) Undesirable effects: Side-effects or Adverse reactions: None reported Monitoring: [place] PMP: Online review of the past 12105-monthriod conducted. Compliant with practice rules and regulations Last UDS on record: Summary  Date Value Ref Range Status  08/30/2017 FINAL  Final    Comment:    ====================================================================  TOXASSURE SELECT 13 (MW) ==================================================================== Test                              Result       Flag       Units Drug Present and Declared for Prescription Verification   Oxycodone                      1004         EXPECTED   ng/mg creat   Oxymorphone                    473          EXPECTED   ng/mg creat   Noroxycodone                   1144         EXPECTED   ng/mg creat   Noroxymorphone                 136          EXPECTED   ng/mg creat    Sources of oxycodone are scheduled prescription medications.    Oxymorphone, noroxycodone, and noroxymorphone are expected    metabolites of oxycodone. Oxymorphone is also available as a    scheduled prescription medication. Drug Present not Declared for Prescription Verification   Lorazepam                      481          UNEXPECTED ng/mg creat    Source of lorazepam is a scheduled prescription medication. ==================================================================== Test                      Result    Flag   Units      Ref Range   Creatinine              213              mg/dL      >=20 ==================================================================== Declared Medications:  The flagging and interpretation on this report are based on the  following declared medications.  Unexpected results may arise from  inaccuracies in the declared medications.  **Note: The testing scope of this panel includes these medications:  Oxycodone  **Note: The testing scope of this panel does not include following  reported medications:  Amlodipine Besylate  Aspirin (Aspirin 81)  Atenolol  Calcium carbonate (Calcium carbonate/Vitamin D3)  Gabapentin  Ibuprofen  Lisinopril  Vitamin D3 (Calcium carbonate/Vitamin D3) ==================================================================== For clinical consultation, please call [phone]. ====================================================================    UDS interpretation: Unexpected findings: Patient reminded of the CDC guidelines recommending to stay away  from the sedatives & benzodiazepines due to the risk of respiratory depression and death. Medication Assessment Form: Reviewed. Patient indicates being compliant with therapy Treatment compliance: Compliant Risk Assessment Profile: Aberrant behavior: See initial evaluations. None observed or detected today Comorbid factors increasing risk of overdose: caucasian and concomitant use of Benzodiazepines Opioid risk tool (ORT):  Opioid Risk  03/13/2018  Alcohol 0  Illegal Drugs 0  Rx Drugs 0  Alcohol 0  Illegal Drugs 0  Rx Drugs 0  Age between 16-45 years  0  History of Preadolescent Sexual Abuse 0  Psychological Disease 0  Depression 0  Opioid Risk Tool Scoring 0  Opioid Risk Interpretation Low Risk  ORT Scoring interpretation table:  Score <3 = Low Risk for SUD  Score between 4-7 = Moderate Risk for SUD  Score >8 = High Risk for Opioid Abuse   Risk of substance use disorder (SUD): Low-to-Moderate  Risk Mitigation Strategies:  Patient Counseling: Covered Patient-Prescriber Agreement (PPA): Present and active  Notification to other healthcare providers: Done  Pharmacologic Plan: No change in therapy, at this time.             ROS  Constitutional: Denies any fever or chills Gastrointestinal: No reported hemesis, hematochezia, vomiting, or acute GI distress Musculoskeletal: Denies any acute onset joint swelling, redness, loss of ROM, or weakness Neurological: No reported episodes of acute onset apraxia, aphasia, dysarthria, agnosia, amnesia, paralysis, loss of coordination, or loss of consciousness  Medication Review  Calcium Plus D3 Absorbable, amLODipine, aspirin EC, atenolol, gabapentin, ibuprofen, lisinopril, and oxyCODONE  History Review  Allergy: Ms. Gieske is allergic to penicillins. Drug: Ms. Kugel  reports no history of drug use. Alcohol:  reports current alcohol use of about 1.0 standard drinks of alcohol per week. Tobacco:  reports that she has never smoked. She  has never used smokeless tobacco. Social: Ms. Mastel  reports that she has never smoked. She has never used smokeless tobacco. She reports current alcohol use of about 1.0 standard drinks of alcohol per week. She reports that she does not use drugs. Medical:  has a past medical history of Abdominal pain (12/22/2012), Abnormal EKG (12/11/2011), Allergy, Altered mental status (12/11/2011), Anxiety, Benzodiazepine dependence ([place]) (12/14/2011), Charcot-Marie disease, Chronic anxiety (12/14/2011), Dehydration (08/0/2233), Diastolic dysfunction (61/22/4497), Drug withdrawal ([place]) (12/12/2011), Hypertension, Hypokalemia (12/11/2011), and Psychosis ([place]) (12/12/2011). Surgical: Ms. Escalante  has a past surgical history that includes Abdominal hysterectomy. Family: family history includes Charcot-Marie-Tooth disease in her mother and son; Heart disease in her father. Problem List: Ms. Richoux does not have any pertinent problems on file.  Lab Review  Kidney Function Lab Results  Component Value Date   BUN 18 11/10/2016   CREATININE 0.87 11/10/2016   BCR 21 11/10/2016   GFRAA 83 11/10/2016   GFRNONAA 72 11/10/2016  Liver Function Lab Results  Component Value Date   AST 11 11/10/2016   ALT 13 (L) 12/31/2015   ALBUMIN 4.2 11/10/2016  Note: Above Lab results reviewed.  Imaging Review  DG C-Arm 1-60 Min-No Report Fluoroscopy was utilized by the requesting physician.  No radiographic  interpretation.  Note: Above imaging results reviewed.        Physical Exam  General appearance: Well nourished, well developed, and well hydrated. In no apparent acute distress Mental status: Alert, oriented x 3 (person, place, & time)       Respiratory: No evidence of acute respiratory distress Eyes: PERLA Vitals: BP (!) 153/82   Pulse (!) 57   Temp 98.3 F (36.8 C)   Ht _0  (1.626 m)   Wt 130 lb (59 kg)   SpO2 100%   BMI 22.31 kg/m  BMI: Estimated body mass index is 22.31 kg/m as calculated from the  following:   Height as of this encounter: _1  (1.626 m).   Weight as of this encounter: 130 lb (59 kg). Ideal: Ideal body weight: 54.7 kg (120 lb 9.5 oz) Adjusted ideal body weight: 56.4 kg (124 lb 5.7 oz)  Assessment   Status Diagnosis  Worsening Persistent Persistent 1. CMT (Charcot-Marie-Tooth disease)   2. Chronic musculoskeletal pain   3. Cervical spondylosis   4. Vitamin D deficiency   5. Chronic  pain syndrome   6. Long term prescription benzodiazepine use   7. Long term prescription opiate use      Updated Problems: No problems updated.  Plan of Care  Medications: I have changed Emiko L. Spradlin "Becky"'s oxyCODONE, oxyCODONE, and oxyCODONE. I am also having her maintain her atenolol, lisinopril, ibuprofen, aspirin EC, amLODipine, gabapentin, and Calcium Plus D3 Absorbable.  Administered today: Marylee L. [employer]" had no medications administered during this visit.  Orders:  No orders of the defined types were placed in this encounter.  InterventionInterventional therapies: Planned, scheduled, and/or pending: Not at this time.   Considering: Diagnostic right-sided L5-S1 lumbar epidural steroid injection Diagnostic bilateral lumbar facet block Possiblebilateral lumbar facet RFA Diagnostic left sided cervical epidural steroid injection  Diagnostic bilateral cervical facet block Possible bilateral cervical facet RFA Diagnostic right shoulder injection Diagnostic right suprascapular nerve block Possible right sided suprascapular nerve RFA   Palliative PRN treatment(s): Diagnostic left sided cervical epidural steroid injection   Note by: Dionisio David, NP Date: 03/13/2018; Time: 9:10 AM

## 2018-03-22 DIAGNOSIS — G600 Hereditary motor and sensory neuropathy: Secondary | ICD-10-CM | POA: Diagnosis not present

## 2018-03-22 DIAGNOSIS — I10 Essential (primary) hypertension: Secondary | ICD-10-CM | POA: Diagnosis not present

## 2018-03-30 ENCOUNTER — Emergency Department
Admission: EM | Admit: 2018-03-30 | Discharge: 2018-03-30 | Disposition: A | Discharge status: Home / Self Care | Payer: Medicare Other | Attending: Emergency Medicine | Admitting: Emergency Medicine

## 2018-03-30 ENCOUNTER — Emergency Department: Payer: Medicare Other

## 2018-03-30 ENCOUNTER — Other Ambulatory Visit: Payer: Self-pay

## 2018-03-30 ENCOUNTER — Telehealth: Payer: Self-pay | Admitting: Nurse Practitioner

## 2018-03-30 ENCOUNTER — Encounter: Payer: Self-pay | Admitting: Emergency Medicine

## 2018-03-30 ENCOUNTER — Emergency Department
Admission: EM | Admit: 2018-03-30 | Discharge: 2018-03-30 | Discharge status: Against Medical Advice | Payer: Medicare Other | Source: Home / Self Care | Attending: Emergency Medicine | Admitting: Emergency Medicine

## 2018-03-30 DIAGNOSIS — Z743 Need for continuous supervision: Secondary | ICD-10-CM | POA: Diagnosis not present

## 2018-03-30 DIAGNOSIS — S060X0A Concussion without loss of consciousness, initial encounter: Secondary | ICD-10-CM

## 2018-03-30 DIAGNOSIS — Y999 Unspecified external cause status: Secondary | ICD-10-CM | POA: Insufficient documentation

## 2018-03-30 DIAGNOSIS — Y929 Unspecified place or not applicable: Secondary | ICD-10-CM | POA: Insufficient documentation

## 2018-03-30 DIAGNOSIS — I10 Essential (primary) hypertension: Secondary | ICD-10-CM

## 2018-03-30 DIAGNOSIS — W0110XA Fall on same level from slipping, tripping and stumbling with subsequent striking against unspecified object, initial encounter: Secondary | ICD-10-CM

## 2018-03-30 DIAGNOSIS — Y9389 Activity, other specified: Secondary | ICD-10-CM | POA: Insufficient documentation

## 2018-03-30 DIAGNOSIS — S0101XA Laceration without foreign body of scalp, initial encounter: Secondary | ICD-10-CM

## 2018-03-30 DIAGNOSIS — Y92009 Unspecified place in unspecified non-institutional (private) residence as the place of occurrence of the external cause: Secondary | ICD-10-CM | POA: Insufficient documentation

## 2018-03-30 DIAGNOSIS — R5381 Other malaise: Secondary | ICD-10-CM | POA: Diagnosis not present

## 2018-03-30 DIAGNOSIS — Y939 Activity, unspecified: Secondary | ICD-10-CM

## 2018-03-30 DIAGNOSIS — Z79899 Other long term (current) drug therapy: Secondary | ICD-10-CM | POA: Insufficient documentation

## 2018-03-30 DIAGNOSIS — R279 Unspecified lack of coordination: Secondary | ICD-10-CM | POA: Diagnosis not present

## 2018-03-30 DIAGNOSIS — R410 Disorientation, unspecified: Secondary | ICD-10-CM | POA: Diagnosis not present

## 2018-03-30 DIAGNOSIS — S199XXA Unspecified injury of neck, initial encounter: Secondary | ICD-10-CM | POA: Diagnosis not present

## 2018-03-30 DIAGNOSIS — S0990XA Unspecified injury of head, initial encounter: Secondary | ICD-10-CM | POA: Diagnosis not present

## 2018-03-30 DIAGNOSIS — R52 Pain, unspecified: Secondary | ICD-10-CM | POA: Diagnosis not present

## 2018-03-30 DIAGNOSIS — W19XXXA Unspecified fall, initial encounter: Secondary | ICD-10-CM | POA: Diagnosis not present

## 2018-03-30 DIAGNOSIS — R55 Syncope and collapse: Secondary | ICD-10-CM | POA: Diagnosis not present

## 2018-03-30 DIAGNOSIS — R300 Dysuria: Secondary | ICD-10-CM | POA: Diagnosis not present

## 2018-03-30 DIAGNOSIS — Z7982 Long term (current) use of aspirin: Secondary | ICD-10-CM

## 2018-03-30 LAB — CBC WITH DIFFERENTIAL/PLATELET
Abs Immature Granulocytes: 0.03 10*3/uL (ref 0.00–0.07)
BASOS ABS: 0 10*3/uL (ref 0.0–0.1)
Basophils Relative: 0 %
Eosinophils Absolute: 0.5 10*3/uL (ref 0.0–0.5)
Eosinophils Relative: 6 %
HEMATOCRIT: 40.2 % (ref 36.0–46.0)
Hemoglobin: 13.1 g/dL (ref 12.0–15.0)
Immature Granulocytes: 0 %
Lymphocytes Relative: 29 %
Lymphs Abs: 2.2 10*3/uL (ref 0.7–4.0)
MCH: 31.2 pg (ref 26.0–34.0)
MCHC: 32.6 g/dL (ref 30.0–36.0)
MCV: 95.7 fL (ref 80.0–100.0)
Monocytes Absolute: 0.6 10*3/uL (ref 0.1–1.0)
Monocytes Relative: 8 %
Neutro Abs: 4.5 10*3/uL (ref 1.7–7.7)
Neutrophils Relative %: 57 %
Platelets: 251 10*3/uL (ref 150–400)
RBC: 4.2 MIL/uL (ref 3.87–5.11)
RDW: 12.6 % (ref 11.5–15.5)
WBC: 7.9 10*3/uL (ref 4.0–10.5)
nRBC: 0 % (ref 0.0–0.2)

## 2018-03-30 LAB — COMPREHENSIVE METABOLIC PANEL
ALBUMIN: 4.2 g/dL (ref 3.5–5.0)
ALT: 8 U/L (ref 0–44)
AST: 22 U/L (ref 15–41)
Alkaline Phosphatase: 61 U/L (ref 38–126)
Anion gap: 9 (ref 5–15)
BUN: 13 mg/dL (ref 8–23)
CO2: 26 mmol/L (ref 22–32)
Calcium: 9.3 mg/dL (ref 8.9–10.3)
Chloride: 104 mmol/L (ref 98–111)
Creatinine, Ser: 0.92 mg/dL (ref 0.44–1.00)
GFR calc Af Amer: >60 mL/min (ref >60)
GFR calc non Af Amer: >60 mL/min (ref >60)
GLUCOSE: 102 mg/dL — AB (ref 70–99)
Potassium: 3.6 mmol/L (ref 3.5–5.1)
Sodium: 139 mmol/L (ref 135–145)
Total Bilirubin: 0.9 mg/dL (ref 0.3–1.2)
Total Protein: 7.7 g/dL (ref 6.5–8.1)

## 2018-03-30 MED ORDER — KETOROLAC TROMETHAMINE 10 MG PO TABS
10.0000 mg | ORAL_TABLET | Freq: Once | ORAL | Status: AC
  Administered 2018-03-30: 10 mg via ORAL
  Filled 2018-03-30: qty 1

## 2018-03-30 MED ORDER — LIDOCAINE HCL (PF) 1 % IJ SOLN
5.0000 mL | Freq: Once | INTRAMUSCULAR | Status: AC
  Administered 2018-03-30: 5 mL via INTRADERMAL
  Filled 2018-03-30: qty 5

## 2018-03-30 NOTE — ED Provider Notes (Signed)
[facility] Emergency Department Provider Note  ____________________________________________  Time seen: Approximately 1:36 PM  I have reviewed the triage vital signs and the nursing notes.   HISTORY  Chief Complaint Altered Mental Status    HPI Maria Wheeler is a 63 y.o. female brought to the ED due to concern for confusion by her family member.  She had a fall last night at home which was mechanical in nature, seen in the ED and had a CT scan of her head and serum labs.  Work-up at that time was unremarkable.  She went home where she is continue taking all of her medications including oxycodone and Ativan and today when they went to neurology clinic for a routine lab draw, the nurse there was concerned about the patient's confusion and instructed her to come to the ED for evaluation.  The patient herself denies any pain or other acute complaints except for urinary frequency.  Patient does not want to be in the hospital and wants to leave right away.      Past Medical History:  Diagnosis Date  . Abdominal pain 12/22/2012  . Abnormal EKG 12/11/2011  . Allergy   . Altered mental status 12/11/2011  . Anxiety   . 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 Charcot-Marie disease   . Chronic anxiety 12/14/2011  . Dehydration 12/11/2011  . Diastolic dysfunction 12/14/2011   Grade 1. Ejection fraction 65-70%.  . Drug withdrawal (HCC) 12/12/2011   Causing confusion, out at her hallucinations, etc. Resolved with Xanax was restarted.  . Hypertension   . Hypokalemia 12/11/2011  . Psychosis (HCC) 12/12/2011     Patient Active Problem List   Diagnosis Date Noted  . Depression, prolonged 07/26/2017  . Lumbar facet osteoarthritis (Bilateral) 12/28/2016  . Bradycardia by electrocardiogram 12/14/2016  . 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 Long term prescription opiate use 12/01/2016  . Opiate use  12/01/2016  . Chronic hand pain (Bilateral) (L>R) 12/01/2016  . Long term prescription benzodiazepine use 12/01/2016  . NSAID long-term use 12/01/2016  . Generalized muscle weakness 12/01/2016  . Vitamin D deficiency 12/01/2016  . DDD (degenerative disc disease), lumbar 11/30/2016  . Lumbar facet arthropathy (Bilateral) 11/30/2016  . Adjustment disorder with mixed anxiety and depressed mood 11/10/2016  . Chronic upper extremity pain (Secondary Area of Pain) (Bilateral) (L>R) 11/10/2016  . Disorder of skeletal system 11/09/2016  . Pharmacologic therapy 11/09/2016  . Problems influencing health status 11/09/2016  . Chronic neck pain [facility] Area of Pain) (Posterior) (Bilateral) (L>R) 11/09/2016  . DDD (degenerative disc disease), cervical 11/09/2016  . Cervical spondylosis 11/09/2016  . Chronic shoulder pain (Right) 11/09/2016  . Chronic upper extremity pain (Left) 11/09/2016  . Chronic pain syndrome 11/09/2016  . Chronic knee pain (B) (L>R) 11/09/2016  . Chronic lumbar radiculopathy (L5/S1) (Left) 11/09/2016  . Chronic lower extremity pain (Primary Area of Pain) (Bilateral) (L>R) 11/09/2016  . Cervical Grade 1 Retrolisthesis of C5 over C6 11/09/2016  . Abnormal nerve conduction studies 11/09/2016  . 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HCC) 11/09/2016  . 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 Benzodiazepine abuse (HCC) 01/02/2016  . Chronic musculoskeletal pain 01/02/2016  . H/O: stroke 01/02/2016  . Multiple falls 12/31/2015  . UTI (urinary tract infection) 12/31/2015  . Hypokalemia 12/31/2015  . SDH (subdural hematoma) (HCC) 12/31/2013  . Depression with anxiety 04/02/2013  . 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 Chronic anxiety 12/14/2011  .  Diastolic dysfunction 12/14/2011  . Change in mental status 12/11/2011  . HTN (hypertension), benign 12/11/2011  . Auditory hallucinations 12/11/2011  . History of  drug dependence (HCC) 12/11/2011     Past Surgical History:  Procedure Laterality Date  . ABDOMINAL HYSTERECTOMY       Prior to Admission medications   Medication Sig Start Date End Date Taking? Authorizing Provider  amLODipine (NORVASC) 5 MG tablet Take by mouth. 07/26/17 07/26/18  [provider]  aspirin EC 81 MG tablet Take 81 mg by mouth daily.    [provider]  atenolol (TENORMIN) 100 MG tablet Take by mouth daily.  03/18/16   [provider]  Calcium Carb-Cholecalciferol (CALCIUM PLUS D3 ABSORBABLE) [phone] MG-UNIT CAPS Take 1 capsule by mouth daily with breakfast. 03/13/18 06/11/18  Barbette Merino, NP  gabapentin (NEURONTIN) 100 MG capsule Take 2 capsules (200 mg total) by mouth 3 (three) times daily. 03/13/18 06/11/18  Barbette Merino, NP  ibuprofen (ADVIL,MOTRIN) 200 MG tablet Take 200 mg by mouth every 6 (six) hours as needed.    [provider]  lisinopril (PRINIVIL,ZESTRIL) 10 MG tablet Take 10 mg by mouth.  11/23/16   [provider]  oxyCODONE (OXY IR/ROXICODONE) 5 MG immediate release tablet Take 1 tablet (5 mg total) by mouth every 6 (six) hours as needed for up to 30 days for severe pain. 05/12/18 06/11/18  Barbette Merino, NP  oxyCODONE (OXY IR/ROXICODONE) 5 MG immediate release tablet Take 1 tablet (5 mg total) by mouth every 6 (six) hours as needed for up to 30 days for severe pain. 04/12/18 05/12/18  Barbette Merino, NP  oxyCODONE (OXY IR/ROXICODONE) 5 MG immediate release tablet Take 1 tablet (5 mg total) by mouth every 6 (six) hours as needed for up to 30 days for severe pain. 03/13/18 04/12/18  Barbette Merino, NP     Allergies Penicillins   Family History  Problem Relation Age of Onset  . Charcot-Marie-Tooth disease Mother   . Heart disease Father   . Charcot-Marie-Tooth disease Son   . Diabetes Neg Hx     Social History Social History   Tobacco Use  . Smoking status: Never Smoker  . Smokeless tobacco: Never Used   Substance Use Topics  . Alcohol use: Yes    Alcohol/week: 1.0 standard drinks    Types: 1 Cans of beer per week  . Drug use: No    Review of Systems  Constitutional:   No fever or chills.  ENT:   No sore throat. No rhinorrhea. Cardiovascular:   No chest pain or syncope. Respiratory:   No dyspnea or cough. Gastrointestinal:   Negative for abdominal pain, vomiting and diarrhea.  Musculoskeletal:   Negative for focal pain or swelling All other systems reviewed and are negative except as documented above in ROS and HPI.  ____________________________________________   PHYSICAL EXAM:  VITAL SIGNS: ED Triage Vitals  Enc Vitals Group     BP 03/30/18 1043 (!) 150/89     Pulse Rate 03/30/18 1043 62     Resp 03/30/18 1043 18     Temp 03/30/18 1043 98.5 F (36.9 C)     Temp Source 03/30/18 1043 Oral     SpO2 03/30/18 1043 98 %     Weight 03/30/18 1043 130 lb (59 kg)     Height 03/30/18 1043  (1.626 m)     Head Circumference --      Peak Flow --  Pain Score 03/30/18 1049 0     Pain Loc --      Pain Edu? --      Excl. in GC? --     Vital signs reviewed, nursing assessments reviewed.   Constitutional:   Alert and oriented to person place and time. Non-toxic appearance. Eyes:   Conjunctivae are normal. EOMI. PERRL. ENT      Head:   Normocephalic and atraumatic.      Nose:   No congestion/rhinnorhea.       Mouth/Throat:   MMM, no pharyngeal erythema. No peritonsillar mass.       Neck:   No meningismus. Full ROM. Hematological/Lymphatic/Immunilogical:   No cervical lymphadenopathy. Cardiovascular:   RRR. Symmetric bilateral radial and DP pulses.  No murmurs. Cap refill less than 2 seconds. Respiratory:   Normal respiratory effort without tachypnea/retractions. Breath sounds are clear and equal bilaterally. No wheezes/rales/rhonchi. Gastrointestinal:   Soft and nontender. Non distended. There is no CVA tenderness.  No rebound, rigidity, or guarding. Genitourinary:    deferred Musculoskeletal:   Normal range of motion in all extremities. No joint effusions.  No lower extremity tenderness.  No edema. Neurologic:   Normal speech and language.  Linear thought process, intact memory Motor grossly intact. Cranial nerves II through XII intact Steady gait No acute focal neurologic deficits are appreciated.  Skin:    Skin is warm, dry and intact. No rash noted.  No petechiae, purpura, or bullae.  ____________________________________________    LABS (pertinent positives/negatives) (all labs ordered are listed, but only abnormal results are displayed) Labs Reviewed  COMPREHENSIVE METABOLIC PANEL - Abnormal; Notable for the following components:      Result Value   Glucose, Bld 102 (*)    All other components within normal limits  URINE CULTURE  CBC WITH DIFFERENTIAL/PLATELET  URINALYSIS, COMPLETE (UACMP) WITH MICROSCOPIC  URINE DRUG SCREEN, QUALITATIVE ([facility] ONLY)   ____________________________________________   EKG    ____________________________________________    RADIOLOGY  Ct Head Wo Contrast  Result Date: 03/30/2018 CLINICAL DATA:  Larey Seat, struck head on water acute heater. No loss of consciousness. History of falls. EXAM: CT HEAD WITHOUT CONTRAST CT CERVICAL SPINE WITHOUT CONTRAST TECHNIQUE: Multidetector CT imaging of the head and cervical spine was performed following the standard protocol without intravenous contrast. Multiplanar CT image reconstructions of the cervical spine were also generated. COMPARISON:  None. FINDINGS: CT HEAD FINDINGS BRAIN: No intraparenchymal hemorrhage, mass effect nor midline shift. Moderate central parenchymal brain volume loss, advanced for age. Old basal ganglia infarcts with mild ex vacuo dilatation bifrontal horns. No hydrocephalus. Patchy to confluent supratentorial white matter hypodensities. Old pontine lacunar infarcts. No acute large vascular territory infarcts. No abnormal extra-axial fluid collections.  Basal cisterns are patent. VASCULAR: Mild calcific atherosclerosis of the carotid siphons. SKULL: No skull fracture. Moderate temporomandibular osteoarthrosis. Small RIGHT parietal scalp hematoma without subcutaneous gas or radiopaque foreign bodies. SINUSES/ORBITS: Trace paranasal sinus mucosal thickening. Mastoid air cells are well aerated.The included ocular globes and orbital contents are non-suspicious. OTHER: None. CT CERVICAL SPINE FINDINGS ALIGNMENT: Maintained lordosis. No malalignment. SKULL BASE AND VERTEBRAE: Cervical vertebral bodies and posterior elements are intact. Moderate to severe C6-7 disc height loss, moderate C3-4, mild at C4-5 and C5-6 with endplate spurring compatible with degenerative discs. No destructive bony lesions. C1-2 articulation maintained. Congenitally capacious canal. Minimal calcified pannus about the odontoid process seen with CPPD. SOFT TISSUES AND SPINAL CANAL: Nonacute. Calcified RIGHT stylohyoid ligament. DISC LEVELS: No high-grade osseous canal stenosis or neural  foraminal narrowing. UPPER CHEST: LEFT apical scarring. OTHER: None. IMPRESSION: CT HEAD: 1. No acute intracranial process.  Small RIGHT scalp hematoma. 2. Stable advanced parenchymal brain volume loss for age. 3. Old pontine and basal ganglia infarcts. Moderate to severe chronic small vessel ischemic changes. CT CERVICAL SPINE: 1. No fracture or malalignment. Electronically Signed   By: Awilda Metroourtnay  Bloomer M.D.   On: 03/30/2018 01:55   Ct Cervical Spine Wo Contrast  Result Date: 03/30/2018 CLINICAL DATA:  Larey SeatFell, struck head on water acute heater. No loss of consciousness. History of falls. EXAM: CT HEAD WITHOUT CONTRAST CT CERVICAL SPINE WITHOUT CONTRAST TECHNIQUE: Multidetector CT imaging of the head and cervical spine was performed following the standard protocol without intravenous contrast. Multiplanar CT image reconstructions of the cervical spine were also generated. COMPARISON:  None. FINDINGS: CT HEAD  FINDINGS BRAIN: No intraparenchymal hemorrhage, mass effect nor midline shift. Moderate central parenchymal brain volume loss, advanced for age. Old basal ganglia infarcts with mild ex vacuo dilatation bifrontal horns. No hydrocephalus. Patchy to confluent supratentorial white matter hypodensities. Old pontine lacunar infarcts. No acute large vascular territory infarcts. No abnormal extra-axial fluid collections. Basal cisterns are patent. VASCULAR: Mild calcific atherosclerosis of the carotid siphons. SKULL: No skull fracture. Moderate temporomandibular osteoarthrosis. Small RIGHT parietal scalp hematoma without subcutaneous gas or radiopaque foreign bodies. SINUSES/ORBITS: Trace paranasal sinus mucosal thickening. Mastoid air cells are well aerated.The included ocular globes and orbital contents are non-suspicious. OTHER: None. CT CERVICAL SPINE FINDINGS ALIGNMENT: Maintained lordosis. No malalignment. SKULL BASE AND VERTEBRAE: Cervical vertebral bodies and posterior elements are intact. Moderate to severe C6-7 disc height loss, moderate C3-4, mild at C4-5 and C5-6 with endplate spurring compatible with degenerative discs. No destructive bony lesions. C1-2 articulation maintained. Congenitally capacious canal. Minimal calcified pannus about the odontoid process seen with CPPD. SOFT TISSUES AND SPINAL CANAL: Nonacute. Calcified RIGHT stylohyoid ligament. DISC LEVELS: No high-grade osseous canal stenosis or neural foraminal narrowing. UPPER CHEST: LEFT apical scarring. OTHER: None. IMPRESSION: CT HEAD: 1. No acute intracranial process.  Small RIGHT scalp hematoma. 2. Stable advanced parenchymal brain volume loss for age. 3. Old pontine and basal ganglia infarcts. Moderate to severe chronic small vessel ischemic changes. CT CERVICAL SPINE: 1. No fracture or malalignment. Electronically Signed   By: Awilda Metroourtnay  Bloomer M.D.   On: 03/30/2018 01:55     ____________________________________________   PROCEDURES Procedures  ____________________________________________    CLINICAL IMPRESSION / ASSESSMENT AND PLAN / ED COURSE  Medications ordered in the ED: Medications - No data to display  Pertinent labs & imaging results that were available during my care of the patient were reviewed by me and considered in my medical decision making (see chart for details).    Patient brought to the ED by her friend due to neurology clinic nurse concern for confusion.  I think this is likely due to concussion symptoms on top of the gabapentin Ativan and oxycodone the patient is already taking.  She is oriented, neurologically intact, not psychotic or suicidal, and by my assessment has medical decision-making capacity to refuse further care.  I informed the patient that I would like to repeat her CT scan of the head to ensure that there is no delayed venous bleeding.  Also, I do not see any results for urinalysis in our system or in care everywhere so would like to obtain that as well today.  The patient refuses, and while I was following up with the lab about the triage urine sample that was  sent (during which the lab inform me that it was QNS), the patient eloped from the emergency department.  She is accompanied by her friend who helps the patient with transportation to medical appointments and safe to return home at this time.   ____________________________________________   FINAL CLINICAL IMPRESSION(S) / ED DIAGNOSES    Final diagnoses:  Concussion without loss of consciousness, initial encounter     ED Discharge Orders    None      Portions of this note were generated with dragon dictation software. Dictation errors may occur despite best attempts at proofreading.   Sharman Cheek, MD 03/30/18 1345

## 2018-03-30 NOTE — ED Notes (Signed)
MD stafford made aware of pt's situation. Verbal order for blood work; MD will evaluate the pt prior to more testing.

## 2018-03-30 NOTE — Telephone Encounter (Signed)
Toni Amend Thrift from Dr. Daisy Blossom office [phone], lvmail stating the social worker at assisted living (did not leave any other info) called them concerned about the patient over medicating. As she was staggering and slurred conversation. Patient stated she was taking meds as directed. They did order  Urine Test to check for infection.  Toni Amend wanted to make our clinic aware as maybe we wanted to check on meds count.   When I pulled up to type in msg. I noticed patient is in ED for scalp lacerations at the moment

## 2018-03-30 NOTE — ED Triage Notes (Signed)
PT arrives with family from [place] home; independent living. Pt confused in triage; family reports this is abnormal for pt. Pt's family states she was called to take pt to the neurologist today. Neurologist evaluated pt prior to arrival and stated pt needed to be seen in the emergency room. Pt denies pain in triage. Last known normal unknown. Pt seen for fall last night; pt doesn't remember being seen last night in the ER.

## 2018-03-30 NOTE — ED Notes (Signed)
This nurse Medical laboratory scientific officer for EMS transport

## 2018-03-30 NOTE — ED Provider Notes (Signed)
[facility] Emergency Department Provider Note _____________________   First MD Initiated Contact with Patient 03/30/18 0022     (approximate)  I have reviewed the triage vital signs and the nursing notes.   HISTORY  Chief Complaint Fall    HPI Maria Wheeler is a 63 y.o. female   presents to the emergency department via EMS following accidental fall with resultant occipital head injury.  Patient states that she was looking out of a window when she turned and lost her balance subsequently falling.  Patient does admit to multiple falls in the past of similar nature.  Patient states that she lost consciousness for approximately 20 to 30 seconds.  Patient denies any other complaints.  Current pain score 7 out of 10   Past Medical History:  Diagnosis Date  . Abdominal pain 12/22/2012  . Abnormal EKG 12/11/2011  . Allergy   . Altered mental status 12/11/2011  . Anxiety   . 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 Charcot-Marie disease   . Chronic anxiety 12/14/2011  . Dehydration 12/11/2011  . Diastolic dysfunction 12/14/2011   Grade 1. Ejection fraction 65-70%.  . Drug withdrawal (HCC) 12/12/2011   Causing confusion, out at her hallucinations, etc. Resolved with Xanax was restarted.  . Hypertension   . Hypokalemia 12/11/2011  . Psychosis (HCC) 12/12/2011    Patient Active Problem List   Diagnosis Date Noted  . Depression, prolonged 07/26/2017  . Lumbar facet osteoarthritis (Bilateral) 12/28/2016  . Bradycardia by electrocardiogram 12/14/2016  . 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 Long term prescription opiate use 12/01/2016  . Opiate use 12/01/2016  . Chronic hand pain (Bilateral) (L>R) 12/01/2016  . Long term prescription benzodiazepine use 12/01/2016  . NSAID long-term use 12/01/2016  . Generalized muscle weakness 12/01/2016  . Vitamin D deficiency 12/01/2016  . DDD  (degenerative disc disease), lumbar 11/30/2016  . Lumbar facet arthropathy (Bilateral) 11/30/2016  . Adjustment disorder with mixed anxiety and depressed mood 11/10/2016  . Chronic upper extremity pain (Secondary Area of Pain) (Bilateral) (L>R) 11/10/2016  . Disorder of skeletal system 11/09/2016  . Pharmacologic therapy 11/09/2016  . Problems influencing health status 11/09/2016  . Chronic neck pain [facility] Area of Pain) (Posterior) (Bilateral) (L>R) 11/09/2016  . DDD (degenerative disc disease), cervical 11/09/2016  . Cervical spondylosis 11/09/2016  . Chronic shoulder pain (Right) 11/09/2016  . Chronic upper extremity pain (Left) 11/09/2016  . Chronic pain syndrome 11/09/2016  . Chronic knee pain (B) (L>R) 11/09/2016  . Chronic lumbar radiculopathy (L5/S1) (Left) 11/09/2016  . Chronic lower extremity pain (Primary Area of Pain) (Bilateral) (L>R) 11/09/2016  . Cervical Grade 1 Retrolisthesis of C5 over C6 11/09/2016  . Abnormal nerve conduction studies 11/09/2016  . 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HCC) 11/09/2016  . 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 Benzodiazepine abuse (HCC) 01/02/2016  . Chronic musculoskeletal pain 01/02/2016  . H/O: stroke 01/02/2016  . Multiple falls 12/31/2015  . UTI (urinary tract infection) 12/31/2015  . Hypokalemia 12/31/2015  . SDH (subdural hematoma) (HCC) 12/31/2013  . Depression with anxiety 04/02/2013  . 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 Chronic anxiety 12/14/2011  . Diastolic dysfunction 12/14/2011  . Change in mental status 12/11/2011  . HTN (hypertension), benign 12/11/2011  . Auditory hallucinations 12/11/2011  . History of drug dependence (HCC) 12/11/2011    Past Surgical History:  Procedure Laterality Date  . ABDOMINAL HYSTERECTOMY  Prior to Admission medications   Medication Sig Start Date End Date Taking? Authorizing Provider  amLODipine  (NORVASC) 5 MG tablet Take by mouth. 07/26/17 07/26/18  [provider]  aspirin EC 81 MG tablet Take 81 mg by mouth daily.    [provider]  atenolol (TENORMIN) 100 MG tablet Take by mouth daily.  03/18/16   [provider]  Calcium Carb-Cholecalciferol (CALCIUM PLUS D3 ABSORBABLE) [phone] MG-UNIT CAPS Take 1 capsule by mouth daily with breakfast. 03/13/18 06/11/18  Barbette Merino, NP  gabapentin (NEURONTIN) 100 MG capsule Take 2 capsules (200 mg total) by mouth 3 (three) times daily. 03/13/18 06/11/18  Barbette Merino, NP  ibuprofen (ADVIL,MOTRIN) 200 MG tablet Take 200 mg by mouth every 6 (six) hours as needed.    [provider]  lisinopril (PRINIVIL,ZESTRIL) 10 MG tablet Take 10 mg by mouth.  11/23/16   [provider]  oxyCODONE (OXY IR/ROXICODONE) 5 MG immediate release tablet Take 1 tablet (5 mg total) by mouth every 6 (six) hours as needed for up to 30 days for severe pain. 05/12/18 06/11/18  Barbette Merino, NP  oxyCODONE (OXY IR/ROXICODONE) 5 MG immediate release tablet Take 1 tablet (5 mg total) by mouth every 6 (six) hours as needed for up to 30 days for severe pain. 04/12/18 05/12/18  Barbette Merino, NP  oxyCODONE (OXY IR/ROXICODONE) 5 MG immediate release tablet Take 1 tablet (5 mg total) by mouth every 6 (six) hours as needed for up to 30 days for severe pain. 03/13/18 04/12/18  Barbette Merino, NP    Allergies Penicillins  Family History  Problem Relation Age of Onset  . Charcot-Marie-Tooth disease Mother   . Heart disease Father   . Charcot-Marie-Tooth disease Son   . Diabetes Neg Hx     Social History Social History   Tobacco Use  . Smoking status: Never Smoker  . Smokeless tobacco: Never Used  Substance Use Topics  . Alcohol use: Yes    Alcohol/week: 1.0 standard drinks    Types: 1 Cans of beer per week  . Drug use: No    Review of Systems Constitutional: No fever/chills Eyes: No visual changes. ENT: No sore  throat. Cardiovascular: Denies chest pain. Respiratory: Denies shortness of breath. Gastrointestinal: No abdominal pain.  No nausea, no vomiting.  No diarrhea.  No constipation. Genitourinary: Negative for dysuria. Musculoskeletal: Negative for neck pain.  Negative for back pain. Integumentary: Positive for occipital scalp laceration Neurological: Negative for headaches, focal weakness or numbness.   ____________________________________________   PHYSICAL EXAM:  VITAL SIGNS: ED Triage Vitals  Enc Vitals Group     BP 03/30/18 0023 139/88     Pulse Rate 03/30/18 0023 77     Resp 03/30/18 0023 10     Temp 03/30/18 0023 98.8 F (37.1 C)     Temp Source 03/30/18 0023 Oral     SpO2 03/30/18 0023 98 %     Weight 03/30/18 0020 59 kg (130 lb)     Height 03/30/18 0020 1.626 m (5\' 4" )     Head Circumference --      Peak Flow --      Pain Score 03/30/18 0020 10     Pain Loc --      Pain Edu? --      Excl. in GC? --     Constitutional: Alert and oriented. Well appearing and in no acute distress. Eyes: Conjunctivae are normal. PERRL. EOMI. Head: Sick centimeter linear  right occipital scalp laceration Mouth/Throat: Mucous membranes are moist.  Oropharynx non-erythematous. Neck: No stridor.  Cardiovascular: Normal rate, regular rhythm. Good peripheral circulation. Grossly normal heart sounds. Respiratory: Normal respiratory effort.  No retractions. Lungs CTAB. Gastrointestinal: Soft and nontender. No distention.  Musculoskeletal: No lower extremity tenderness nor edema. No gross deformities of extremities. Neurologic:  Normal speech and language. No gross focal neurologic deficits are appreciated.  Skin: Sick centimeter linear occipital scalp laceration Psychiatric: Mood and affect are normal. Speech and behavior are normal.     RADIOLOGY I, [place] N Tjuana Vickrey, personally viewed and evaluated these images (plain radiographs) as part of my medical decision making, as well as  reviewing the written report by the radiologist.  ED MD interpretation: No acute intracranial process noted on CT head small right scalp hematoma old pontine and basal ganglia infarcts moderate to severe chronic small vessel disease per radiologist.  Official radiology report(s): Ct Head Wo Contrast  Result Date: 03/30/2018 CLINICAL DATA:  Larey SeatFell, struck head on water acute heater. No loss of consciousness. History of falls. EXAM: CT HEAD WITHOUT CONTRAST CT CERVICAL SPINE WITHOUT CONTRAST TECHNIQUE: Multidetector CT imaging of the head and cervical spine was performed following the standard protocol without intravenous contrast. Multiplanar CT image reconstructions of the cervical spine were also generated. COMPARISON:  None. FINDINGS: CT HEAD FINDINGS BRAIN: No intraparenchymal hemorrhage, mass effect nor midline shift. Moderate central parenchymal brain volume loss, advanced for age. Old basal ganglia infarcts with mild ex vacuo dilatation bifrontal horns. No hydrocephalus. Patchy to confluent supratentorial white matter hypodensities. Old pontine lacunar infarcts. No acute large vascular territory infarcts. No abnormal extra-axial fluid collections. Basal cisterns are patent. VASCULAR: Mild calcific atherosclerosis of the carotid siphons. SKULL: No skull fracture. Moderate temporomandibular osteoarthrosis. Small RIGHT parietal scalp hematoma without subcutaneous gas or radiopaque foreign bodies. SINUSES/ORBITS: Trace paranasal sinus mucosal thickening. Mastoid air cells are well aerated.The included ocular globes and orbital contents are non-suspicious. OTHER: None. CT CERVICAL SPINE FINDINGS ALIGNMENT: Maintained lordosis. No malalignment. SKULL BASE AND VERTEBRAE: Cervical vertebral bodies and posterior elements are intact. Moderate to severe C6-7 disc height loss, moderate C3-4, mild at C4-5 and C5-6 with endplate spurring compatible with degenerative discs. No destructive bony lesions. C1-2 articulation  maintained. Congenitally capacious canal. Minimal calcified pannus about the odontoid process seen with CPPD. SOFT TISSUES AND SPINAL CANAL: Nonacute. Calcified RIGHT stylohyoid ligament. DISC LEVELS: No high-grade osseous canal stenosis or neural foraminal narrowing. UPPER CHEST: LEFT apical scarring. OTHER: None. IMPRESSION: CT HEAD: 1. No acute intracranial process.  Small RIGHT scalp hematoma. 2. Stable advanced parenchymal brain volume loss for age. 3. Old pontine and basal ganglia infarcts. Moderate to severe chronic small vessel ischemic changes. CT CERVICAL SPINE: 1. No fracture or malalignment. Electronically Signed   By: Awilda Metroourtnay  Bloomer M.D.   On: 03/30/2018 01:55   Ct Cervical Spine Wo Contrast  Result Date: 03/30/2018 CLINICAL DATA:  Larey SeatFell, struck head on water acute heater. No loss of consciousness. History of falls. EXAM: CT HEAD WITHOUT CONTRAST CT CERVICAL SPINE WITHOUT CONTRAST TECHNIQUE: Multidetector CT imaging of the head and cervical spine was performed following the standard protocol without intravenous contrast. Multiplanar CT image reconstructions of the cervical spine were also generated. COMPARISON:  None. FINDINGS: CT HEAD FINDINGS BRAIN: No intraparenchymal hemorrhage, mass effect nor midline shift. Moderate central parenchymal brain volume loss, advanced for age. Old basal ganglia infarcts with mild ex vacuo dilatation bifrontal horns. No hydrocephalus. Patchy to confluent supratentorial white matter hypodensities. Old  pontine lacunar infarcts. No acute large vascular territory infarcts. No abnormal extra-axial fluid collections. Basal cisterns are patent. VASCULAR: Mild calcific atherosclerosis of the carotid siphons. SKULL: No skull fracture. Moderate temporomandibular osteoarthrosis. Small RIGHT parietal scalp hematoma without subcutaneous gas or radiopaque foreign bodies. SINUSES/ORBITS: Trace paranasal sinus mucosal thickening. Mastoid air cells are well aerated.The included  ocular globes and orbital contents are non-suspicious. OTHER: None. CT CERVICAL SPINE FINDINGS ALIGNMENT: Maintained lordosis. No malalignment. SKULL BASE AND VERTEBRAE: Cervical vertebral bodies and posterior elements are intact. Moderate to severe C6-7 disc height loss, moderate C3-4, mild at C4-5 and C5-6 with endplate spurring compatible with degenerative discs. No destructive bony lesions. C1-2 articulation maintained. Congenitally capacious canal. Minimal calcified pannus about the odontoid process seen with CPPD. SOFT TISSUES AND SPINAL CANAL: Nonacute. Calcified RIGHT stylohyoid ligament. DISC LEVELS: No high-grade osseous canal stenosis or neural foraminal narrowing. UPPER CHEST: LEFT apical scarring. OTHER: None. IMPRESSION: CT HEAD: 1. No acute intracranial process.  Small RIGHT scalp hematoma. 2. Stable advanced parenchymal brain volume loss for age. 3. Old pontine and basal ganglia infarcts. Moderate to severe chronic small vessel ischemic changes. CT CERVICAL SPINE: 1. No fracture or malalignment. Electronically Signed   By: Awilda Metro M.D.   On: 03/30/2018 01:55      .Marland KitchenLaceration Repair Date/Time: 03/30/2018 8:05 AM Performed by: Darci Current, MD Authorized by: Darci Current, MD   Consent:    Consent obtained:  Verbal   Consent given by:  Patient   Risks discussed:  Infection, pain, retained foreign body, poor cosmetic result and poor wound healing Anesthesia (see MAR for exact dosages):    Anesthesia method:  Local infiltration   Local anesthetic:  Lidocaine 1% w/o epi Laceration details:    Location:  Scalp   Scalp location:  Occipital   Length (cm):  6 Repair type:    Repair type:  Simple Exploration:    Hemostasis achieved with:  Direct pressure   Wound exploration: entire depth of wound probed and visualized     Contaminated: no   Treatment:    Area cleansed with:  Saline   Amount of cleaning:  Extensive   Irrigation solution:  Sterile saline    Visualized foreign bodies/material removed: no   Skin repair:    Repair method:  Staples   Number of staples:  5 Approximation:    Approximation:  Close Post-procedure details:    Dressing:  Sterile dressing   Patient tolerance of procedure:  Tolerated well, no immediate complications     ____________________________________________   INITIAL IMPRESSION / MDM / ASSESSMENT AND PLAN / ED COURSE  As part of my medical decision making, I reviewed the following data within the electronic MEDICAL RECORD NUMBER  63 year old female presenting with above-stated history and physical exam fall mechanical fall with loss of consciousness.  CT scan performed to evaluate for possible skull fracture, intracranial abnormality which was negative.  Patient's wound was repaired with staples.  Patient tolerated procedure well ____________________________________________  FINAL CLINICAL IMPRESSION(S) / ED DIAGNOSES  Final diagnoses:  Scalp laceration, initial encounter     MEDICATIONS GIVEN DURING THIS VISIT:  Medications  ketorolac (TORADOL) tablet 10 mg (10 mg Oral Given 03/30/18 0055)  lidocaine (PF) (XYLOCAINE) 1 % injection 5 mL (5 mLs Intradermal Given by Other 03/30/18 1914)     ED Discharge Orders    None       Note:  This document was prepared using Dragon voice recognition software and may include  unintentional dictation errors.   Darci Current, MD 03/30/18 [phone]

## 2018-03-30 NOTE — ED Triage Notes (Addendum)
Pt to ED via EMS from home with mechanical fall. Pt lost balance and hit head on water heater, pt states no LOC or blood thinner use. Pt has hx of multiple falls due to chronic abuse of benzos. Pt arrives with hematoma and lac to back of right side head. NAD. VSS.

## 2018-03-30 NOTE — ED Notes (Signed)
During assessment of PT, pt got up to leave. RN attempted to reason with pt. Friend told pt to go for CT and then they could leave. CT arrived, RN asked pt to go with CT to make sure no further injuries sustained from hall. PT walked down hall, RN followed requesting pt to stay, charge RN notified. Pt threatened to strangle this RN with pony tail if she didn't shut up. Per Dr. Scotty Court and Charge RN, must let pt leave. Pt directed by other staff members to lobby. Family/friend informed Pt would not be penalized for leaving and to please bring pt back if she changes her mind about being seen.

## 2018-03-30 NOTE — ED Notes (Signed)
Patient was helped up to restroom by staff member.

## 2018-03-30 NOTE — ED Notes (Signed)
First Nurse Note: Patient here from Dr. Daisy Blossom office.  Family states they were advised to bring her here related to AMS.  Patient alert, aware she is at [facility] and answers questions appropriately.  Color good.

## 2018-03-30 NOTE — ED Notes (Addendum)
Nurse attempted to call family member for transport home but patient said that her son is her only family and he is in the hospital in [place].

## 2018-04-03 ENCOUNTER — Telehealth: Payer: Self-pay

## 2018-04-03 ENCOUNTER — Other Ambulatory Visit: Payer: Self-pay | Admitting: Nurse Practitioner

## 2018-04-03 NOTE — Telephone Encounter (Signed)
She sees [facility] for meds and they called to let us know that her social worker called and said she appears to be overmedicating herself, slurred speech, stumbling etc. Dr. Geralyn Flash office is ordering UDS. The patient states there is nothing wrong with her and she is taking her medicine the way she is supposed to. They just wanted to make [place] aware of this. IF any questions call Courtney at DR. Potters office.

## 2018-04-03 NOTE — Telephone Encounter (Signed)
Crystal,            This is an Financial planner from Dr. Daisy Blossom office. Please review and let nursing know if there is anything you would like for [place] to do. Also, please review patient's recent ED visit. Thank you-   Victorino Dike

## 2018-04-03 NOTE — Telephone Encounter (Signed)
Hi thanks for the FYI. I have her at moderate risk. I do not like the fact that she uses the Benzo, lorazepam with the narcotics.

## 2018-04-06 DIAGNOSIS — Z4802 Encounter for removal of sutures: Secondary | ICD-10-CM | POA: Diagnosis not present

## 2018-04-06 DIAGNOSIS — G600 Hereditary motor and sensory neuropathy: Secondary | ICD-10-CM | POA: Diagnosis not present

## 2018-04-28 ENCOUNTER — Emergency Department: Payer: Medicare Other

## 2018-04-28 ENCOUNTER — Emergency Department
Admission: EM | Admit: 2018-04-28 | Discharge: 2018-04-29 | Disposition: A | Discharge status: Home / Self Care | Payer: Medicare Other | Attending: Emergency Medicine | Admitting: Emergency Medicine

## 2018-04-28 ENCOUNTER — Encounter: Payer: Self-pay | Admitting: Emergency Medicine

## 2018-04-28 DIAGNOSIS — I10 Essential (primary) hypertension: Secondary | ICD-10-CM | POA: Insufficient documentation

## 2018-04-28 DIAGNOSIS — Z79899 Other long term (current) drug therapy: Secondary | ICD-10-CM | POA: Insufficient documentation

## 2018-04-28 DIAGNOSIS — F29 Unspecified psychosis not due to a substance or known physiological condition: Secondary | ICD-10-CM | POA: Diagnosis not present

## 2018-04-28 DIAGNOSIS — R410 Disorientation, unspecified: Secondary | ICD-10-CM | POA: Diagnosis not present

## 2018-04-28 DIAGNOSIS — N390 Urinary tract infection, site not specified: Secondary | ICD-10-CM | POA: Diagnosis not present

## 2018-04-28 DIAGNOSIS — Z7982 Long term (current) use of aspirin: Secondary | ICD-10-CM | POA: Diagnosis not present

## 2018-04-28 DIAGNOSIS — R4182 Altered mental status, unspecified: Secondary | ICD-10-CM | POA: Diagnosis not present

## 2018-04-28 LAB — BASIC METABOLIC PANEL
Anion gap: 11 (ref 5–15)
BUN: 8 mg/dL (ref 8–23)
CO2: 27 mmol/L (ref 22–32)
Calcium: 9.3 mg/dL (ref 8.9–10.3)
Chloride: 105 mmol/L (ref 98–111)
Creatinine, Ser: 1.03 mg/dL — ABNORMAL HIGH (ref 0.44–1.00)
GFR calc Af Amer: >60 mL/min (ref >60)
GFR calc non Af Amer: 57 mL/min — ABNORMAL LOW (ref >60)
Glucose, Bld: 104 mg/dL — ABNORMAL HIGH (ref 70–99)
Potassium: 3 mmol/L — ABNORMAL LOW (ref 3.5–5.1)
SODIUM: 143 mmol/L (ref 135–145)

## 2018-04-28 LAB — URINALYSIS, COMPLETE (UACMP) WITH MICROSCOPIC
Bilirubin Urine: NEGATIVE
Glucose, UA: NEGATIVE mg/dL
Hgb urine dipstick: NEGATIVE
Ketones, ur: NEGATIVE mg/dL
Nitrite: NEGATIVE
Protein, ur: NEGATIVE mg/dL
Specific Gravity, Urine: 1.013 (ref 1.005–1.030)
pH: 5 (ref 5.0–8.0)

## 2018-04-28 LAB — CBC
HCT: 39.1 % (ref 36.0–46.0)
Hemoglobin: 12.6 g/dL (ref 12.0–15.0)
MCH: 30.8 pg (ref 26.0–34.0)
MCHC: 32.2 g/dL (ref 30.0–36.0)
MCV: 95.6 fL (ref 80.0–100.0)
Platelets: 192 10*3/uL (ref 150–400)
RBC: 4.09 MIL/uL (ref 3.87–5.11)
RDW: 13.3 % (ref 11.5–15.5)
WBC: 8.6 10*3/uL (ref 4.0–10.5)
nRBC: 0 % (ref 0.0–0.2)

## 2018-04-28 LAB — URINE DRUG SCREEN, QUALITATIVE (ARMC ONLY)
Amphetamines, Ur Screen: NOT DETECTED
Barbiturates, Ur Screen: NOT DETECTED
Benzodiazepine, Ur Scrn: POSITIVE — AB
Cannabinoid 50 Ng, Ur ~~LOC~~: NOT DETECTED
Cocaine Metabolite,Ur ~~LOC~~: NOT DETECTED
MDMA (Ecstasy)Ur Screen: NOT DETECTED
Methadone Scn, Ur: NOT DETECTED
Opiate, Ur Screen: POSITIVE — AB
Phencyclidine (PCP) Ur S: NOT DETECTED
TRICYCLIC, UR SCREEN: NOT DETECTED

## 2018-04-28 LAB — GLUCOSE, CAPILLARY: Glucose-Capillary: 88 mg/dL (ref 70–99)

## 2018-04-28 MED ORDER — POTASSIUM CHLORIDE 20 MEQ/15ML (10%) PO SOLN
40.0000 meq | Freq: Once | ORAL | Status: AC
  Administered 2018-04-28: 40 meq via ORAL
  Filled 2018-04-28: qty 30

## 2018-04-28 MED ORDER — FOSFOMYCIN TROMETHAMINE 3 G PO PACK
3.0000 g | PACK | ORAL | Status: AC
  Administered 2018-04-28: 3 g via ORAL
  Filled 2018-04-28: qty 3

## 2018-04-28 NOTE — BH Assessment (Signed)
Assessment Note  Maria Wheeler is an 63 y.o. female who presents to the ER via EMS, due to DSS receiving concerns about the patient behaviors and mental state. Per DSS Levan Hurst [phone]), she received a wellness check report stating the patient was wandering and not taking care of herself. When she arrived to the apartment, the door was open. She had the apartment/property manager to go in the home with her. When she attempted to talk with the patient, she was falling asleep, mumbling her words and unable to participate in the interview. DSS checked the house to see what medications she was taking and it was still unclear why she was incoherent, so she called EMS.  DSS further reports, prior to calling 911, she was unaware of the patient's history of substance use.  Per the report of the patient, she don't know why she was brought to the ER and was unsure who brought her. Throughout the interview, the patient denied SI/HI and AV/H. At times she would fall asleep and had to be asked the question again. When awoke, she was able to provide answers.  Per DSS Levan Hurst [phone]), if patient discharged, can she be informed, so she can fellow up with her at her home.    Writer staffed patient with Per Nurse Practitioner Nanine Means, and she stated when patient medically cleared, she can be discharged home.   Past Medical History:  Past Medical History:  Diagnosis Date  . Abdominal pain 12/22/2012  . Abnormal EKG 12/11/2011  . Allergy   . Altered mental status 12/11/2011  . Anxiety   . 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 Charcot-Marie disease   . Chronic anxiety 12/14/2011  . Dehydration 12/11/2011  . Diastolic dysfunction 12/14/2011   Grade 1. Ejection fraction 65-70%.  . Drug withdrawal (HCC) 12/12/2011   Causing confusion, out at her hallucinations, etc. Resolved with Xanax was restarted.  . Hypertension   . Hypokalemia 12/11/2011  . Psychosis (HCC) 12/12/2011     Past Surgical History:  Procedure Laterality Date  . ABDOMINAL HYSTERECTOMY      Family History:  Family History  Problem Relation Age of Onset  . Charcot-Marie-Tooth disease Mother   . Heart disease Father   . Charcot-Marie-Tooth disease Son   . Diabetes Neg Hx     Social History:  reports that she has never smoked. She has never used smokeless tobacco. She reports current alcohol use of about 1.0 standard drinks of alcohol per week. She reports that she does not use drugs.  Additional Social History:  Alcohol / Drug Use Pain Medications: Pt has history of pain med abuse but son does not think this is current issue. Prescriptions: Pt has history of xanax abuse Over the Counter: na History of alcohol / drug use?: Yes Longest period of sobriety (when/how long): Unable to quantify Negative Consequences of Use: Personal relationships, Work / School  CIWA: CIWA-Ar BP: (!) 156/81 Pulse Rate: (!) 58 COWS:    Allergies:  Allergies  Allergen Reactions  . Penicillins Rash    rash    Home Medications: (Not in a hospital admission)   OB/GYN Status:  No LMP recorded. Patient has had a hysterectomy.  General Assessment Data Location of Assessment: [facility] ED TTS Assessment: In system Is this a Tele or Face-to-Face Assessment?: Face-to-Face Is this an Initial Assessment or a Re-assessment for this encounter?: Initial Assessment Language Other than English: No Living Arrangements: Other (Comment)(Private Home) What gender do you identify as?: Female Marital status:  Single Pregnancy Status: No Living Arrangements: Alone Can pt return to current living arrangement?: Yes Admission Status: Involuntary Petitioner: Other([place] DSS) Is patient capable of signing voluntary admission?: No(Under IVC) Referral Source: Self/Family/Friend Insurance type: None  Medical Screening Exam ([facility] Walk-in ONLY) Medical Exam completed: Yes  Crisis Care Plan Living Arrangements:  Alone Legal Guardian: Other:(None) Name of Psychiatrist: Reports of none Name of Therapist: Reports of none  Education Status Is patient currently in school?: No Is the patient employed, unemployed or receiving disability?: Unemployed  Risk to self with the past 6 months Suicidal Ideation: No Has patient been a risk to self within the past 6 months prior to admission? : No Suicidal Intent: No Has patient had any suicidal intent within the past 6 months prior to admission? : No Is patient at risk for suicide?: No Suicidal Plan?: No Has patient had any suicidal plan within the past 6 months prior to admission? : No Access to Means: No What has been your use of drugs/alcohol within the last 12 months?: History of abusing pain pills Previous Attempts/Gestures: No How many times?: 0 Other Self Harm Risks: Pain pill abuse Triggers for Past Attempts: None known Intentional Self Injurious Behavior: None Family Suicide History: No Recent stressful life event(s): Other (Comment) Persecutory voices/beliefs?: No Depression: No Depression Symptoms: (Reports of none) Substance abuse history and/or treatment for substance abuse?: Yes Suicide prevention information given to non-admitted patients: Not applicable  Risk to Others within the past 6 months Homicidal Ideation: No Does patient have any lifetime risk of violence toward others beyond the six months prior to admission? : No Thoughts of Harm to Others: No Current Homicidal Intent: No Current Homicidal Plan: No Access to Homicidal Means: No Identified Victim: Reports of none History of harm to others?: No Assessment of Violence: None Noted Violent Behavior Description: Reports of none Does patient have access to weapons?: No Criminal Charges Pending?: No Does patient have a court date: No Is patient on probation?: No  Psychosis Hallucinations: None noted Delusions: None noted  Mental Status Report Appearance/Hygiene:  Unremarkable Eye Contact: Fair Motor Activity: Unremarkable(Patient sitting in bed) Speech: Logical/coherent, Soft, Unremarkable Level of Consciousness: Alert, Drowsy Mood: Pleasant Affect: Appropriate to circumstance Anxiety Level: None Thought Processes: Coherent, Relevant Judgement: Partial Orientation: Person, Place, Time, Appropriate for developmental age Obsessive Compulsive Thoughts/Behaviors: None  Cognitive Functioning Concentration: Decreased Memory: Remote Intact, Recent Impaired Is patient IDD: No Insight: Fair Impulse Control: Fair Appetite: Good Have you had any weight changes? : No Change Sleep: No Change Total Hours of Sleep: 7 Vegetative Symptoms: None  ADLScreening [facility] Assessment Services) Patient's cognitive ability adequate to safely complete daily activities?: Yes Patient able to express need for assistance with ADLs?: Yes Independently performs ADLs?: Yes (appropriate for developmental age)  Prior Inpatient Therapy Prior Inpatient Therapy: Yes Prior Therapy Dates: 03/2008 Prior Therapy Facilty/Provider(s): [facility] Reason for Treatment: Depression  Prior Outpatient Therapy Prior Outpatient Therapy: No Does patient have an ACCT team?: No Does patient have Intensive In-House Services?  : No Does patient have [facility] services? : No Does patient have P4CC services?: No  ADL Screening (condition at time of admission) Patient's cognitive ability adequate to safely complete daily activities?: Yes Is the patient deaf or have difficulty hearing?: No Does the patient have difficulty seeing, even when wearing glasses/contacts?: No Does the patient have difficulty concentrating, remembering, or making decisions?: No Patient able to express need for assistance with ADLs?: Yes Does the patient have difficulty dressing or  bathing?: No Independently performs ADLs?: Yes (appropriate for developmental age) Does the patient have difficulty walking or climbing  stairs?: No Weakness of Legs: None Weakness of Arms/Hands: None  Home Assistive Devices/Equipment Home Assistive Devices/Equipment: None  Therapy Consults (therapy consults require a physician order) PT Evaluation Needed: No OT Evalulation Needed: No SLP Evaluation Needed: No Abuse/Neglect Assessment (Assessment to be complete while patient is alone) Abuse/Neglect Assessment Can Be Completed: Yes Physical Abuse: Denies Verbal Abuse: Denies Sexual Abuse: Denies Exploitation of patient/patient's resources: Denies Self-Neglect: Denies Values / Beliefs Cultural Requests During Hospitalization: None Spiritual Requests During Hospitalization: None Consults Spiritual Care Consult Needed: No Social Work Consult Needed: No Merchant navy officer (For Healthcare) Does Patient Have a Medical Advance Directive?: No       Child/Adolescent Assessment Running Away Risk: Denies(Patient is an adult)  Disposition:  Disposition Initial Assessment Completed for this Encounter: Yes  On Site Evaluation by:   Reviewed with Physician:    Lilyan Gilford MS, LCAS, LPMHCA, NCC, CCSI Therapeutic Triage Specialist 04/28/2018 7:28 PM

## 2018-04-28 NOTE — ED Notes (Signed)
IVC/Consult ordered/Pending 

## 2018-04-28 NOTE — ED Notes (Signed)
Pt changed into burgundy scrubs. Pt has has 1 set of pajamas and cell phone in belongings bag.

## 2018-04-28 NOTE — ED Triage Notes (Signed)
Pt to ED from [facility] by EMS under IVC. Per DSS pt wandering around complex and unable to care for herself.

## 2018-04-28 NOTE — BH Assessment (Addendum)
Writer called and left a HIPPA Compliant message with [place] DSS Levan Hurst Johnson-[phone]), requesting a return phone call.  Writer called and left a HIPPA Compliant message with sister-n-law (Darlene [phone]), requesting a return phone call.  Writer unable to contact patient's son ([phone]). The number in the patient's demographic is a non-working number. Patient states he have a new phone number and she do not remember it. Per her report, she spoke with him approximately two weeks ago.

## 2018-04-28 NOTE — ED Provider Notes (Signed)
[facility] Emergency Department Provider Note   ____________________________________________   First MD Initiated Contact with Patient 04/28/18 1545     (approximate)  I have reviewed the triage vital signs and the nursing notes.   HISTORY  Chief Complaint Altered Mental Status  EM caveat: Patient some confusion, poor recollection  HPI Maria Wheeler is a 63 y.o. female here for evaluation for wandering.  Evidently per IVC papers has been wandering, having some hallucinations, seeming confused.  Patient reports that she is not really sure why they brought her here.  She knows she is at a hospital but cannot tell me what month it is.  Not sure what the situation is.  She is alert, but unable to provide any notable history of and tell me that she is not in any discomfort or pain except for her usual chronic pains.  No nausea or vomiting.  Denies hallucinations.  Denies wanting to hurt herself or anyone else.   Past Medical History:  Diagnosis Date  . Abdominal pain 12/22/2012  . Abnormal EKG 12/11/2011  . Allergy   . Altered mental status 12/11/2011  . Anxiety   . 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 Charcot-Marie disease   . Chronic anxiety 12/14/2011  . Dehydration 12/11/2011  . Diastolic dysfunction 12/14/2011   Grade 1. Ejection fraction 65-70%.  . Drug withdrawal (HCC) 12/12/2011   Causing confusion, out at her hallucinations, etc. Resolved with Xanax was restarted.  . Hypertension   . Hypokalemia 12/11/2011  . Psychosis (HCC) 12/12/2011    Patient Active Problem List   Diagnosis Date Noted  . Depression, prolonged 07/26/2017  . Lumbar facet osteoarthritis (Bilateral) 12/28/2016  . Bradycardia by electrocardiogram 12/14/2016  . 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 Long term prescription opiate use 12/01/2016  . Opiate use 12/01/2016  . Chronic hand pain (Bilateral) (L>R)  12/01/2016  . Long term prescription benzodiazepine use 12/01/2016  . NSAID long-term use 12/01/2016  . Generalized muscle weakness 12/01/2016  . Vitamin D deficiency 12/01/2016  . DDD (degenerative disc disease), lumbar 11/30/2016  . Lumbar facet arthropathy (Bilateral) 11/30/2016  . Adjustment disorder with mixed anxiety and depressed mood 11/10/2016  . Chronic upper extremity pain (Secondary Area of Pain) (Bilateral) (L>R) 11/10/2016  . Disorder of skeletal system 11/09/2016  . Pharmacologic therapy 11/09/2016  . Problems influencing health status 11/09/2016  . Chronic neck pain [facility] Area of Pain) (Posterior) (Bilateral) (L>R) 11/09/2016  . DDD (degenerative disc disease), cervical 11/09/2016  . Cervical spondylosis 11/09/2016  . Chronic shoulder pain (Right) 11/09/2016  . Chronic upper extremity pain (Left) 11/09/2016  . Chronic pain syndrome 11/09/2016  . Chronic knee pain (B) (L>R) 11/09/2016  . Chronic lumbar radiculopathy (L5/S1) (Left) 11/09/2016  . Chronic lower extremity pain (Primary Area of Pain) (Bilateral) (L>R) 11/09/2016  . Cervical Grade 1 Retrolisthesis of C5 over C6 11/09/2016  . Abnormal nerve conduction studies 11/09/2016  . 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HCC) 11/09/2016  . 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 Benzodiazepine abuse (HCC) 01/02/2016  . Chronic musculoskeletal pain 01/02/2016  . H/O: stroke 01/02/2016  . Multiple falls 12/31/2015  . UTI (urinary tract infection) 12/31/2015  . Hypokalemia 12/31/2015  . SDH (subdural hematoma) (HCC) 12/31/2013  . Depression with anxiety 04/02/2013  . 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 Chronic anxiety 12/14/2011  . Diastolic dysfunction 12/14/2011  . Change in mental status 12/11/2011  . HTN (  hypertension), benign 12/11/2011  . Auditory hallucinations 12/11/2011  . History of drug dependence (HCC) 12/11/2011    Past  Surgical History:  Procedure Laterality Date  . ABDOMINAL HYSTERECTOMY      Prior to Admission medications   Medication Sig Start Date End Date Taking? Authorizing Provider  amLODipine (NORVASC) 5 MG tablet Take by mouth. 07/26/17 07/26/18  [provider]  aspirin EC 81 MG tablet Take 81 mg by mouth daily.    [provider]  atenolol (TENORMIN) 100 MG tablet Take by mouth daily.  03/18/16   [provider]  Calcium Carb-Cholecalciferol (CALCIUM PLUS D3 ABSORBABLE) [phone] MG-UNIT CAPS Take 1 capsule by mouth daily with breakfast. 03/13/18 06/11/18  Barbette Merino, NP  gabapentin (NEURONTIN) 100 MG capsule Take 2 capsules (200 mg total) by mouth 3 (three) times daily. 03/13/18 06/11/18  Barbette Merino, NP  ibuprofen (ADVIL,MOTRIN) 200 MG tablet Take 200 mg by mouth every 6 (six) hours as needed.    [provider]  lisinopril (PRINIVIL,ZESTRIL) 10 MG tablet Take 10 mg by mouth.  11/23/16   [provider]  oxyCODONE (OXY IR/ROXICODONE) 5 MG immediate release tablet Take 1 tablet (5 mg total) by mouth every 6 (six) hours as needed for up to 30 days for severe pain. 05/12/18 06/11/18  Barbette Merino, NP  oxyCODONE (OXY IR/ROXICODONE) 5 MG immediate release tablet Take 1 tablet (5 mg total) by mouth every 6 (six) hours as needed for up to 30 days for severe pain. 04/12/18 05/12/18  Barbette Merino, NP    Allergies Penicillins  Family History  Problem Relation Age of Onset  . Charcot-Marie-Tooth disease Mother   . Heart disease Father   . Charcot-Marie-Tooth disease Son   . Diabetes Neg Hx     Social History Social History   Tobacco Use  . Smoking status: Never Smoker  . Smokeless tobacco: Never Used  Substance Use Topics  . Alcohol use: Yes    Alcohol/week: 1.0 standard drinks    Types: 1 Cans of beer per week  . Drug use: No    Review of Systems Constitutional: No fever/chills the patient denies any new illness or concerns Eyes: No  visual changes. ENT: No sore throat. Cardiovascular: Denies chest pain. Respiratory: Denies shortness of breath. Gastrointestinal: No abdominal pain.    Genitourinary: Negative for dysuria. Musculoskeletal: Chronic aches and pains nothing new.  Uses a walker for mobility. Skin: Negative for rash. Neurological: Negative for headaches or hallucinations.    ____________________________________________   PHYSICAL EXAM:  VITAL SIGNS: ED Triage Vitals  Enc Vitals Group     BP 04/28/18 1549 (!) 156/81     Pulse Rate 04/28/18 1549 (!) 58     Resp 04/28/18 1549 16     Temp 04/28/18 1549 98.5 F (36.9 C)     Temp Source 04/28/18 1549 Oral     SpO2 04/28/18 1549 99 %     Weight --      Height --      Head Circumference --      Peak Flow --      Pain Score 04/28/18 1550 0     Pain Loc --      Pain Edu? --      Excl. in GC? --     Constitutional: Alert and oriented to self and being in a hospital but not to month, does recognize it is 2020. Well appearing and in no acute distress.  She is  resting, but alerts easily to my room entry. Eyes: Conjunctivae are normal. Head: Atraumatic. Nose: No congestion/rhinnorhea. Mouth/Throat: Mucous membranes are moist. Neck: No stridor.  Cardiovascular: Normal rate, regular rhythm. Grossly normal heart sounds.  Good peripheral circulation. Respiratory: Normal respiratory effort.  No retractions. Lungs CTAB. Gastrointestinal: Soft and nontender. No distention. Musculoskeletal: No lower extremity tenderness nor edema. Neurologic:  Normal speech and language. No gross focal neurologic deficits are appreciated.  Skin:  Skin is warm, dry and intact. No rash noted. Psychiatric: Mood and affect are slightly flat.  Calm.  Denies desire to harm herself or anyone else.  ____________________________________________   LABS (all labs ordered are listed, but only abnormal results are displayed)  Labs Reviewed  URINALYSIS, COMPLETE (UACMP) WITH  MICROSCOPIC - Abnormal; Notable for the following components:      Result Value   Color, Urine YELLOW (*)    APPearance CLOUDY (*)    Leukocytes,Ua LARGE (*)    Bacteria, UA RARE (*)    All other components within normal limits  BASIC METABOLIC PANEL - Abnormal; Notable for the following components:   Potassium 3.0 (*)    Glucose, Bld 104 (*)    Creatinine, Ser 1.03 (*)    GFR calc non Af Amer 57 (*)    All other components within normal limits  URINE DRUG SCREEN, QUALITATIVE ([facility] ONLY) - Abnormal; Notable for the following components:   Opiate, Ur Screen POSITIVE (*)    Benzodiazepine, Ur Scrn POSITIVE (*)    All other components within normal limits  CBC  GLUCOSE, CAPILLARY  CBG MONITORING, ED   ____________________________________________  EKG   ____________________________________________  RADIOLOGY  Ct Head Wo Contrast  Result Date: 04/28/2018 CLINICAL DATA:  Confusion.  Worsened mental status. EXAM: CT HEAD WITHOUT CONTRAST TECHNIQUE: Contiguous axial images were obtained from the base of the skull through the vertex without intravenous contrast. COMPARISON:  03/30/2018 FINDINGS: Brain: Mild generalized atrophy. Chronic small-vessel ischemic changes of the cerebral hemispheric white matter. 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 Old small vessel insults of the brainstem. No evidence of acute or subacute infarction, mass lesion, hemorrhage, hydrocephalus or extra-axial collection. Vascular: There is atherosclerotic calcification of the major vessels at the base of the brain. Skull: Negative Sinuses/Orbits: Clear/normal Other: None IMPRESSION: No acute finding by CT. Chronic atrophy. Chronic small-vessel ischemic changes of the brainstem, basal ganglia and hemispheric white matter Electronically Signed   By: Paulina Fusi M.D.   On: 04/28/2018 17:19    CT head reviewed negative for acute ____________________________________________   PROCEDURES  Procedure(s)  performed: None  Procedures  Critical Care performed: No  ____________________________________________   INITIAL IMPRESSION / ASSESSMENT AND PLAN / ED COURSE  Pertinent labs & imaging results that were available during my care of the patient were reviewed by me and considered in my medical decision making (see chart for details).   Patient here for evaluation under IVC.  The situation is reported that she has been wandering having some hallucinations.  Review of her records indicates that she has had some altered mental status evaluation reviewed in the past, prior history of falls but no evidence of trauma today.  Situation has not 100% clear as to her history, so I will obtain work-up including basic labs, CT of the head.  She does not appear to be an immediate threat to herself or anyone else but does appear to have some confusion, whether this is some baseline confusion or possibly other things like polypharmacy  or not is hard to tell.  She does not have classic signs of an overdose, denies anything like that and she is breathing respirating quite comfortably in no distress.  I have placed a consultation to our TTS and psychiatry services for further recommendations as well.  Clinical Course as of Apr 28 2314  Fri Apr 28, 2018  1953 Patient was seen by nurse practitioner Shaune Pollack, advises ok to discharge and have plan for outpatient care, however IVC was not rescinded.  Tele-psych consult ordered   [MQ]  1953 Patient is fully alert, resting comfortably in no distress.  Pleasant.   [MQ]  1953 Is   [MQ]    Clinical Course User Index [MQ] Sharyn Creamer, MD   Ongoing care assigned to Dr. Dolores Frame, follow-up on tele-psychiatry recommendations.  Anticipate likely her IVC will be rescinded and she will be able to be discharged back to her care home as planned.  ____________________________________________   FINAL CLINICAL IMPRESSION(S) / ED DIAGNOSES  Final diagnoses:  Lower urinary tract  infectious disease  Confusion        Note:  This document was prepared using Dragon voice recognition software and may include unintentional dictation errors       Sharyn Creamer, MD 04/28/18 2317

## 2018-04-28 NOTE — Discharge Instructions (Signed)
Please only use your medications as prescribed, never more than prescribed. This can be dangerous.

## 2018-04-28 NOTE — Progress Notes (Addendum)
Patient has no suicidal/homicidal ideations, hallucinations, or substance abuse but has a history of benzodiazepine abuse.  Positive for benzos and opiates but prescribed these opiates based on her home medications.  She told this provider she is here for pain and difficulty walking related to her Carcot-Marie-Tooth disease, concussion noted on 2/27 in her chart.  Pleasantly chatting and eating, talks about her 64 yo granddaughter.  Psychiatrically cleared at this time unless collateral information reports differently.  Attempts were made by Robinette Haines with no success.  Nanine Means, PMHNP

## 2018-04-29 DIAGNOSIS — N390 Urinary tract infection, site not specified: Secondary | ICD-10-CM | POA: Diagnosis not present

## 2018-04-29 LAB — FOLATE: FOLATE: 10.9 ng/mL (ref >5.9)

## 2018-04-29 LAB — VITAMIN B12: Vitamin B-12: 696 pg/mL (ref 180–914)

## 2018-04-29 LAB — TSH: TSH: 0.531 u[IU]/mL (ref 0.350–4.500)

## 2018-04-29 MED ORDER — GABAPENTIN 100 MG PO CAPS
200.0000 mg | ORAL_CAPSULE | Freq: Three times a day (TID) | ORAL | Status: DC
  Administered 2018-04-29: 200 mg via ORAL
  Filled 2018-04-29 (×3): qty 2

## 2018-04-29 MED ORDER — CALCIUM CARBONATE-VITAMIN D 500-200 MG-UNIT PO TABS
1.0000 | ORAL_TABLET | Freq: Every day | ORAL | Status: DC
  Administered 2018-04-29: 1 via ORAL
  Filled 2018-04-29: qty 1

## 2018-04-29 MED ORDER — LISINOPRIL 10 MG PO TABS
10.0000 mg | ORAL_TABLET | Freq: Every day | ORAL | Status: DC
  Administered 2018-04-29: 10 mg via ORAL
  Filled 2018-04-29: qty 1

## 2018-04-29 MED ORDER — ATENOLOL 25 MG PO TABS
100.0000 mg | ORAL_TABLET | Freq: Every day | ORAL | Status: DC
  Administered 2018-04-29: 100 mg via ORAL
  Filled 2018-04-29: qty 4

## 2018-04-29 MED ORDER — OXYCODONE HCL 5 MG PO TABS
5.0000 mg | ORAL_TABLET | Freq: Four times a day (QID) | ORAL | Status: DC | PRN
  Administered 2018-04-29: 5 mg via ORAL
  Filled 2018-04-29: qty 1

## 2018-04-29 MED ORDER — ASPIRIN EC 81 MG PO TBEC
81.0000 mg | DELAYED_RELEASE_TABLET | Freq: Every day | ORAL | Status: DC
  Administered 2018-04-29: 81 mg via ORAL
  Filled 2018-04-29: qty 1

## 2018-04-29 MED ORDER — AMLODIPINE BESYLATE 5 MG PO TABS
5.0000 mg | ORAL_TABLET | Freq: Every day | ORAL | Status: DC
  Administered 2018-04-29: 5 mg via ORAL
  Filled 2018-04-29: qty 1

## 2018-04-29 NOTE — ED Notes (Addendum)
Pt is AOx4 at this time. Pt is alert, calm, and cooperative for vitals. Pt does not report any SI/HI. Pt only has "typical pain" per pt in legs and feet. Pt up independently to use restroom at this time. Pt to receive breakfast soon.

## 2018-04-29 NOTE — ED Notes (Signed)
Voicemail left with Barbara Cower (son) new number.

## 2018-04-29 NOTE — ED Notes (Signed)
Pt given meal tray at this time 

## 2018-04-29 NOTE — ED Notes (Signed)
Darlene called in pt contacts and said she is in isolation in [place] unable to come get pt. She says pt should have phone numbers in her phone.

## 2018-04-29 NOTE — Consult Note (Signed)
[facility] Face-to-Face Psychiatry Consult   Reason for Consult:  Dr. Phineas Real Referring Physician:  Dr. Reita May Patient Identification: Maria Wheeler MRN:  [id number] Diagnosis:   Altered Mental status, resolved  Total Time spent with patient: 1 hour   HPI:   Angelena is a 63 year old Caucasian female with no known psychiatric history.  She presented perhaps due to wandering, but the patient does not recall this.  Tells me that she has been feeling good, not suicidal or homicidal in any capacity.  Tells me "I have a beautiful 46 year old granddaughter, and she would not herself.  Patient is alert and oriented x3 at this time.  I did attempt to call her son at [phone] for collateral information.  Unable to leave a message at this time.  Also is called the Department of human services case manager on the chart, but unable to reach her as well.  Patient denies symptoms of mania psychosis OCD and PTSD.  No recent stressors.  She does have chronic problems with sleeping and poor appetite.  Says that this is been ongoing since the death of her nephew 23 years ago.  Patient lives independently since being separated from her husband in 2001.  Patient denies recent falls or ailments.  Denies overtaking her medications, or using illicit drugs or alcohol.  Past Psychiatric History: No history of psychiatric admissions.  She is not on any psychiatric medications.  No past history of suicide attempts.  Risk to Self: Suicidal Ideation: No Suicidal Intent: No Is patient at risk for suicide?: No Suicidal Plan?: No Access to Means: No What has been your use of drugs/alcohol within the last 12 months?: History of abusing pain pills How many times?: 0 Other Self Harm Risks: Pain pill abuse Triggers for Past Attempts: None known Intentional Self Injurious Behavior: None Risk to Others: Homicidal Ideation: No Thoughts of Harm to Others: No Current Homicidal Intent: No Current Homicidal Plan: No Access to  Homicidal Means: No Identified Victim: Reports of none History of harm to others?: No Assessment of Violence: None Noted Violent Behavior Description: Reports of none Does patient have access to weapons?: No Criminal Charges Pending?: No Does patient have a court date: No Prior Inpatient Therapy: Prior Inpatient Therapy: Yes Prior Therapy Dates: 03/2008 Prior Therapy Facilty/Provider(s): [facility] Reason for Treatment: Depression Prior Outpatient Therapy: Prior Outpatient Therapy: No Does patient have an ACCT team?: No Does patient have Intensive In-House Services?  : No Does patient have [facility] services? : No Does patient have P4CC services?: No  Past Medical History:  Past Medical History:  Diagnosis Date  . Abdominal pain 12/22/2012  . Abnormal EKG 12/11/2011  . Allergy   . Altered mental status 12/11/2011  . Anxiety   . 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 Charcot-Marie disease   . Chronic anxiety 12/14/2011  . Dehydration 12/11/2011  . Diastolic dysfunction 12/14/2011   Grade 1. Ejection fraction 65-70%.  . Drug withdrawal (HCC) 12/12/2011   Causing confusion, out at her hallucinations, etc. Resolved with Xanax was restarted.  . Hypertension   . Hypokalemia 12/11/2011  . Psychosis (HCC) 12/12/2011    Past Surgical History:  Procedure Laterality Date  . ABDOMINAL HYSTERECTOMY     Family History:  Family History  Problem Relation Age of Onset  . Charcot-Marie-Tooth disease Mother   . Heart disease Father   . Charcot-Marie-Tooth disease Son   . Diabetes Neg Hx    Family Psychiatric  History: None  Social History:  Social History  Substance and Sexual Activity  Alcohol Use Yes  . Alcohol/week: 1.0 standard drinks  . Types: 1 Cans of beer per week     Social History   Substance and Sexual Activity  Drug Use No    Social History   Socioeconomic History  . Marital status: Divorced    Spouse name: Not on file  . Number of children: Not on file   . Years of education: Not on file  . Highest education level: Not on file  Occupational History  . Not on file  Social Needs  . Financial resource strain: Not on file  . Food insecurity:    Worry: Not on file    Inability: Not on file  . Transportation needs:    Medical: Not on file    Non-medical: Not on file  Tobacco Use  . Smoking status: Never Smoker  . Smokeless tobacco: Never Used  Substance and Sexual Activity  . Alcohol use: Yes    Alcohol/week: 1.0 standard drinks    Types: 1 Cans of beer per week  . Drug use: No  . Sexual activity: Never  Lifestyle  . Physical activity:    Days per week: Not on file    Minutes per session: Not on file  . Stress: Not on file  Relationships  . Social connections:    Talks on phone: Not on file    Gets together: Not on file    Attends religious service: Not on file    Active member of club or organization: Not on file    Attends meetings of clubs or organizations: Not on file    Relationship status: Not on file  Other Topics Concern  . Not on file  Social History Narrative  . Not on file   Additional Social History:    Allergies:   Allergies  Allergen Reactions  . Penicillins Rash    rash    Labs:  Results for orders placed or performed during the hospital encounter of 04/28/18 (from the past 48 hour(s))  Urinalysis, Complete w Microscopic     Status: Abnormal   Collection Time: 04/28/18  4:49 PM  Result Value Ref Range   Color, Urine YELLOW (A) YELLOW   APPearance CLOUDY (A) CLEAR   Specific Gravity, Urine 1.013 1.005 - 1.030   pH 5.0 5.0 - 8.0   Glucose, UA NEGATIVE NEGATIVE mg/dL   Hgb urine dipstick NEGATIVE NEGATIVE   Bilirubin Urine NEGATIVE NEGATIVE   Ketones, ur NEGATIVE NEGATIVE mg/dL   Protein, ur NEGATIVE NEGATIVE mg/dL   Nitrite NEGATIVE NEGATIVE   Leukocytes,Ua LARGE (A) NEGATIVE   RBC / HPF 0-5 0 - 5 RBC/hpf   WBC, UA 21-50 0 - 5 WBC/hpf   Bacteria, UA RARE (A) NONE SEEN   Squamous Epithelial  / LPF 6-10 0 - 5   Mucus PRESENT    Hyaline Casts, UA PRESENT     Comment: Performed at [facility], [address] [place], [place] [address]  CBC     Status: None   Collection Time: 04/28/18  4:49 PM  Result Value Ref Range   WBC 8.6 4.0 - 10.5 K/uL   RBC 4.09 3.87 - 5.11 MIL/uL   Hemoglobin 12.6 12.0 - 15.0 g/dL   HCT 60.4 54.0 - 98.1 %   MCV 95.6 80.0 - 100.0 fL   MCH 30.8 26.0 - 34.0 pg   MCHC 32.2 30.0 - 36.0 g/dL   RDW 19.1 47.8 - 29.5 %  Platelets 192 150 - 400 K/uL   nRBC 0.0 0.0 - 0.2 %    Comment: Performed at [facility], [address] [place], [place] [address]  Basic metabolic panel     Status: Abnormal   Collection Time: 04/28/18  4:49 PM  Result Value Ref Range   Sodium 143 135 - 145 mmol/L   Potassium 3.0 (L) 3.5 - 5.1 mmol/L   Chloride 105 98 - 111 mmol/L   CO2 27 22 - 32 mmol/L   Glucose, Bld 104 (H) 70 - 99 mg/dL   BUN 8 8 - 23 mg/dL   Creatinine, Ser 5.04 (H) 0.44 - 1.00 mg/dL   Calcium 9.3 8.9 - 13.6 mg/dL   GFR calc non Af Amer 57 (L) >60 mL/min   GFR calc Af Amer >60 >60 mL/min   Anion gap 11 5 - 15    Comment: Performed at [facility], [address] [place], [place] [address]  TSH     Status: None   Collection Time: 04/28/18  4:49 PM  Result Value Ref Range   TSH 0.531 0.350 - 4.500 uIU/mL    Comment: Performed by a 3rd Generation assay with a functional sensitivity of <=0.01 uIU/mL. Performed at [facility], [address] [place], [place] [address]   Folate     Status: None   Collection Time: 04/28/18  4:49 PM  Result Value Ref Range   Folate 10.9 >5.9 ng/mL    Comment: Performed at [facility], [address] [place], [place] [address]  Glucose, capillary     Status: None   Collection Time: 04/28/18  5:23 PM  Result Value Ref Range   Glucose-Capillary 88 70 - 99 mg/dL  Urine Drug Screen, Qualitative ([facility] only)     Status: Abnormal   Collection Time: 04/28/18  5:34 PM  Result  Value Ref Range   Tricyclic, Ur Screen NONE DETECTED NONE DETECTED   Amphetamines, Ur Screen NONE DETECTED NONE DETECTED   MDMA (Ecstasy)Ur Screen NONE DETECTED NONE DETECTED   Cocaine Metabolite,Ur [place] NONE DETECTED NONE DETECTED   Opiate, Ur Screen POSITIVE (A) NONE DETECTED   Phencyclidine (PCP) Ur S NONE DETECTED NONE DETECTED   Cannabinoid 50 Ng, Ur [place] NONE DETECTED NONE DETECTED   Barbiturates, Ur Screen NONE DETECTED NONE DETECTED   Benzodiazepine, Ur Scrn POSITIVE (A) NONE DETECTED   Methadone Scn, Ur NONE DETECTED NONE DETECTED    Comment: (NOTE) Tricyclics + metabolites, urine    Cutoff 1000 ng/mL Amphetamines + metabolites, urine  Cutoff 1000 ng/mL MDMA (Ecstasy), urine              Cutoff 500 ng/mL Cocaine Metabolite, urine          Cutoff 300 ng/mL Opiate + metabolites, urine        Cutoff 300 ng/mL Phencyclidine (PCP), urine         Cutoff 25 ng/mL Cannabinoid, urine                 Cutoff 50 ng/mL Barbiturates + metabolites, urine  Cutoff 200 ng/mL Benzodiazepine, urine              Cutoff 200 ng/mL Methadone, urine                   Cutoff 300 ng/mL The urine drug screen provides only a preliminary, unconfirmed analytical test result and should not be used for non-medical purposes. Clinical consideration and professional judgment should be  applied to any positive drug screen result due to possible interfering substances. A more specific alternate chemical method must be used in order to obtain a confirmed analytical result. Gas chromatography / mass spectrometry (GC/MS) is the preferred confirmat ory method. Performed at [facility], [address] [place], [place] [address]     Current Facility-Administered Medications  Medication Dose Route Frequency Provider Last Rate Last Dose  . amLODipine (NORVASC) tablet 5 mg  5 mg Oral Daily Irean Hong, MD   5 mg at 04/29/18 0916  . aspirin EC tablet 81 mg  81 mg Oral Daily Irean Hong, MD   81 mg at  04/29/18 6045  . atenolol (TENORMIN) tablet 100 mg  100 mg Oral Daily Irean Hong, MD   100 mg at 04/29/18 0915  . calcium-vitamin D (OSCAL WITH D) 500-200 MG-UNIT per tablet 1 tablet  1 tablet Oral Q breakfast Irean Hong, MD   1 tablet at 04/29/18 0915  . gabapentin (NEURONTIN) capsule 200 mg  200 mg Oral TID Irean Hong, MD   200 mg at 04/29/18 0916  . lisinopril (PRINIVIL,ZESTRIL) tablet 10 mg  10 mg Oral Daily Irean Hong, MD   10 mg at 04/29/18 0916  . oxyCODONE (Oxy IR/ROXICODONE) immediate release tablet 5 mg  5 mg Oral Q6H PRN Irean Hong, MD   5 mg at 04/29/18 4098   Current Outpatient Medications  Medication Sig Dispense Refill  . amLODipine (NORVASC) 5 MG tablet Take by mouth.    Marland Kitchen aspirin EC 81 MG tablet Take 81 mg by mouth daily.    Marland Kitchen atenolol (TENORMIN) 100 MG tablet Take by mouth daily.     . Calcium Carb-Cholecalciferol (CALCIUM PLUS D3 ABSORBABLE) [phone] MG-UNIT CAPS Take 1 capsule by mouth daily with breakfast. 90 capsule 0  . gabapentin (NEURONTIN) 100 MG capsule Take 2 capsules (200 mg total) by mouth 3 (three) times daily. 180 capsule 2  . ibuprofen (ADVIL,MOTRIN) 200 MG tablet Take 200 mg by mouth every 6 (six) hours as needed.    Marland Kitchen lisinopril (PRINIVIL,ZESTRIL) 10 MG tablet Take 10 mg by mouth.     Melene Muller ON 05/12/2018] oxyCODONE (OXY IR/ROXICODONE) 5 MG immediate release tablet Take 1 tablet (5 mg total) by mouth every 6 (six) hours as needed for up to 30 days for severe pain. 120 tablet 0  . oxyCODONE (OXY IR/ROXICODONE) 5 MG immediate release tablet Take 1 tablet (5 mg total) by mouth every 6 (six) hours as needed for up to 30 days for severe pain. 120 tablet 0    Musculoskeletal: Strength & Muscle Tone: within normal limits Gait & Station: normal Patient leans: Right  Psychiatric Specialty Exam: Physical Exam  ROS  Blood pressure (!) 197/95, pulse 65, temperature 98.5 F (36.9 C), temperature source Oral, resp. rate 16, SpO2 98 %.There is no height or  weight on file to calculate BMI.  General Appearance: Casual  Eye Contact:  Good  Speech:  Normal Rate  Volume:  Normal  Mood:  Euthymic  Affect:  Full Range  Thought Process:  Coherent  Orientation:  Negative  Thought Content:  Logical  Suicidal Thoughts:  No  Homicidal Thoughts:  No  Memory:  Negative  Judgement:  Good  Insight:  Good  Psychomotor Activity:  Normal  Concentration:  Concentration: Good  Recall:  Good  Fund of Knowledge:  Good  Language:  Good  Akathisia:  Negative  Handed:  Right  AIMS (if indicated):     Assets:  Communication Skills  ADL's:  Intact  Cognition:  WNL  Sleep:        Treatment Plan Summary:  Patient seen and assessed Chart reviewed  laboratories reviewed Attempted to obtain collateral information Patient is alert and oriented x3, no longer confused We will resend patient's involuntary commitment Medication changes Patient is to follow-up for her sleep disturbances with her primary care physician.  May take melatonin over-the-counter if needed    Disposition: No evidence of imminent risk to self or others at present.    Reggie Pile, MD 04/29/2018 9:27 AM

## 2018-04-29 NOTE — ED Provider Notes (Addendum)
-----------------------------------------   6:02 AM on 04/29/2018 -----------------------------------------   Blood pressure (!) 161/87, pulse 66, temperature 98.3 F (36.8 C), resp. rate 14, SpO2 99 %.  The patient is sleeping at this time.  There have been no acute events since the last update.  Patient was evaluated by [facility] psychiatrist Dr Sharlene Motts who recommends obtaining collateral information with family regarding patient's baseline mental state as it is unclear.  Recommends adding TSH, B12 and folate.  Patient had CT scan.   Irean Hong, MD 04/29/18 1191    Irean Hong, MD 04/29/18 [phone]

## 2018-04-29 NOTE — ED Notes (Signed)
Cab called to get pt home. Pt to be d/c to lobby and driven home by [employer].

## 2018-04-29 NOTE — ED Notes (Signed)
Report from silvia, rn. Pt sleeping. resps unlabored.

## 2018-04-29 NOTE — ED Notes (Signed)
Pt continues to sleep. resps unlabored.  

## 2018-04-29 NOTE — ED Notes (Signed)
Report to jessica, rn

## 2018-04-29 NOTE — ED Notes (Signed)
Pt is sleeping, no distress noted, will continue to monitor

## 2018-04-29 NOTE — ED Notes (Signed)
Psychiatry at bedside.

## 2018-04-29 NOTE — ED Notes (Signed)
Pt awoken for vital signs. Pt is alert to place, situation, but disoriented to time. Med rec performed with assist of pt. Pt declines further needs.

## 2018-04-29 NOTE — ED Notes (Signed)
Pt called multiple people on her phone and no one will come pick her up.

## 2018-06-05 ENCOUNTER — Other Ambulatory Visit: Payer: Self-pay

## 2018-06-05 ENCOUNTER — Ambulatory Visit: Payer: Medicare Other | Attending: Nurse Practitioner | Admitting: Nurse Practitioner

## 2018-06-05 DIAGNOSIS — G600 Hereditary motor and sensory neuropathy: Secondary | ICD-10-CM | POA: Diagnosis not present

## 2018-06-05 DIAGNOSIS — G894 Chronic pain syndrome: Secondary | ICD-10-CM | POA: Diagnosis not present

## 2018-06-05 DIAGNOSIS — M7918 Myalgia, other site: Secondary | ICD-10-CM | POA: Diagnosis not present

## 2018-06-05 DIAGNOSIS — G8929 Other chronic pain: Secondary | ICD-10-CM

## 2018-06-05 DIAGNOSIS — E559 Vitamin D deficiency, unspecified: Secondary | ICD-10-CM | POA: Diagnosis not present

## 2018-06-05 DIAGNOSIS — M4722 Other spondylosis with radiculopathy, cervical region: Secondary | ICD-10-CM | POA: Diagnosis not present

## 2018-06-05 MED ORDER — GABAPENTIN 100 MG PO CAPS: 200.0000 mg | ORAL_CAPSULE | Freq: Three times a day (TID) | ORAL | 2 refills | Status: DC

## 2018-06-05 MED ORDER — OXYCODONE HCL 5 MG PO TABS: 5.0000 mg | ORAL_TABLET | Freq: Four times a day (QID) | ORAL | 0 refills | Status: DC | PRN

## 2018-06-05 MED ORDER — CALCIUM PLUS D3 ABSORBABLE 600-2500 MG-UNIT PO CAPS: 1.0000 | ORAL_CAPSULE | Freq: Every day | ORAL | 0 refills | Status: DC

## 2018-06-05 NOTE — Progress Notes (Signed)
Pain Management Encounter Note - Virtual Visit via Telephone Telehealth (real-time audio visits between healthcare provider and patient).  Patient's Phone No. & Preferred Pharmacy:  [phone] (home); [phone] (mobile); (Preferred) [phone]  [facility] - [place], [place] - [address] [place] [address] Phone: [phone] Fax: [phone]  [id number] - [place], [place] - [address] [place] [address] Phone: [phone] Fax: [phone]   Pre-screening note:  Our staff contacted Maria Wheeler and offered her an "in person", "face-to-face" appointment versus a telephone encounter. She indicated preferring the telephone encounter, at this time.  Reason for Virtual Visit: COVID-19*  Social distancing based on CDC and AMA recommendations.   I contacted Maria Wheeler on 06/05/2018 at 11:17 AM by telephone and clearly identified myself as Thad Ranger, NP. I verified that I was speaking with the correct person using two identifiers (Name and date of birth: Sep 21, 1955).  Advanced Informed Consent I sought verbal advanced consent from Maria Wheeler for telemedicine interactions and virtual visit. I informed Maria Wheeler of the security and privacy concerns, risks, and limitations associated with performing an evaluation and management service by telephone. I also informed Maria Wheeler of the availability of "in person" appointments and I informed her of the possibility of a patient responsible charge related to this service. Maria Wheeler expressed understanding and agreed to proceed.   Historic Elements   Maria Wheeler is a 63 y.o. year old, female patient evaluated today after her last encounter by our practice on 04/03/2018. Maria Wheeler  has a past medical history of Abdominal pain (12/22/2012), Abnormal EKG (12/11/2011), Allergy, Altered mental status (12/11/2011), Anxiety, Benzodiazepine dependence  (HCC) (12/14/2011), Charcot-Marie disease, Chronic anxiety (12/14/2011), Dehydration (12/11/2011), Diastolic dysfunction (12/14/2011), Drug withdrawal (HCC) (12/12/2011), Hypertension, Hypokalemia (12/11/2011), and Psychosis (HCC) (12/12/2011). She also  has a past surgical history that includes Abdominal hysterectomy. Maria Wheeler has a current medication list which includes the following prescription(s): amlodipine, aspirin ec, atenolol, calcium plus d3 absorbable, gabapentin, ibuprofen, lisinopril, oxycodone, oxycodone, and oxycodone. She  reports that she has never smoked. She has never used smokeless tobacco. She reports current alcohol use of about 1.0 standard drinks of alcohol per week. She reports that she does not use drugs. Maria Wheeler is allergic to penicillins.   HPI  I last saw her on 03/30/2018. She is being evaluated for medication management. She rates her pain 10/10. She admits that this is getting wrorse. She knows that she can expect this because it just gets worse. She suffered a fall about one month. She had to have staples in her head. She is leaning to live with her pain and and balance issues and her falls.   Pharmacotherapy Assessment  Analgesic:Oxycodone IR 5 mg 1 tablet by mouth every 6 hours when necessary (20mg /dayof oxycodone) MME/day:30mg /day.  Monitoring: Pharmacotherapy: No side-effects or adverse reactions reported. [place] PMP: PDMP reviewed during this encounter.       Compliance: No problems identified. Plan: Refer to "POC".  Review of recent tests  CT Head Wo Contrast CLINICAL DATA:  Confusion.  Worsened mental status.  EXAM: CT HEAD WITHOUT CONTRAST  TECHNIQUE: Contiguous axial images were obtained from the base of the skull through the vertex without intravenous contrast.  COMPARISON:  03/30/2018  FINDINGS: Brain: Mild generalized atrophy. Chronic small-vessel ischemic changes of the cerebral hemispheric white matter. Old lacunar infarctions of the  basal ganglia. Old small vessel insults of the brainstem. No  evidence of acute or subacute infarction, mass lesion, hemorrhage, hydrocephalus or extra-axial collection.  Vascular: There is atherosclerotic calcification of the major vessels at the base of the brain.  Skull: Negative  Sinuses/Orbits: Clear/normal  Other: None  IMPRESSION: No acute finding by CT. Chronic atrophy. Chronic small-vessel ischemic changes of the brainstem, basal ganglia and hemispheric white matter  Electronically Signed   By: Paulina Fusi M.D.   On: 04/28/2018 17:19   Admission on 04/28/2018, Discharged on 04/29/2018  Component Date Value Ref Range Status  . Color, Urine 04/28/2018 YELLOW* YELLOW Final  . APPearance 04/28/2018 CLOUDY* CLEAR Final  . Specific Gravity, Urine 04/28/2018 1.013  1.005 - 1.030 Final  . pH 04/28/2018 5.0  5.0 - 8.0 Final  . Glucose, UA 04/28/2018 NEGATIVE  NEGATIVE mg/dL Final  . Hgb urine dipstick 04/28/2018 NEGATIVE  NEGATIVE Final  . Bilirubin Urine 04/28/2018 NEGATIVE  NEGATIVE Final  . Ketones, ur 04/28/2018 NEGATIVE  NEGATIVE mg/dL Final  . Protein, ur 16/11/9602 NEGATIVE  NEGATIVE mg/dL Final  . Nitrite 54/10/8117 NEGATIVE  NEGATIVE Final  . Leukocytes,Ua 04/28/2018 LARGE* NEGATIVE Final  . RBC / HPF 04/28/2018 0-5  0 - 5 RBC/hpf Final  . WBC, UA 04/28/2018 21-50  0 - 5 WBC/hpf Final  . Bacteria, UA 04/28/2018 RARE* NONE SEEN Final  . Squamous Epithelial / LPF 04/28/2018 6-10  0 - 5 Final  . Mucus 04/28/2018 PRESENT   Final  . Hyaline Casts, UA 04/28/2018 PRESENT   Final   Performed at [facility], [address], [place], [place] [address]  . WBC 04/28/2018 8.6  4.0 - 10.5 K/uL Final  . RBC 04/28/2018 4.09  3.87 - 5.11 MIL/uL Final  . Hemoglobin 04/28/2018 12.6  12.0 - 15.0 g/dL Final  . HCT 95/62/1308 39.1  36.0 - 46.0 % Final  . MCV 04/28/2018 95.6  80.0 - 100.0 fL Final  . MCH 04/28/2018 30.8  26.0 - 34.0 pg Final  . MCHC 04/28/2018 32.2  30.0  - 36.0 g/dL Final  . RDW 65/78/4696 13.3  11.5 - 15.5 % Final  . Platelets 04/28/2018 192  150 - 400 K/uL Final  . nRBC 04/28/2018 0.0  0.0 - 0.2 % Final   Performed at [facility], [address], [place], [place] [address]  . Sodium 04/28/2018 143  135 - 145 mmol/L Final  . Potassium 04/28/2018 3.0* 3.5 - 5.1 mmol/L Final  . Chloride 04/28/2018 105  98 - 111 mmol/L Final  . CO2 04/28/2018 27  22 - 32 mmol/L Final  . Glucose, Bld 04/28/2018 104* 70 - 99 mg/dL Final  . BUN 41/32/4401 8  8 - 23 mg/dL Final  . Creatinine, Ser 04/28/2018 1.03* 0.44 - 1.00 mg/dL Final  . Calcium 02/72/5366 9.3  8.9 - 10.3 mg/dL Final  . GFR calc non Af Amer 04/28/2018 57* >60 mL/min Final  . GFR calc Af Amer 04/28/2018 >60  >60 mL/min Final  . Anion gap 04/28/2018 11  5 - 15 Final   Performed at [facility], [address] [place], [place] [address]  . Tricyclic, Ur Screen 04/28/2018 NONE DETECTED  NONE DETECTED Final  . Amphetamines, Ur Screen 04/28/2018 NONE DETECTED  NONE DETECTED Final  . MDMA (Ecstasy)Ur Screen 04/28/2018 NONE DETECTED  NONE DETECTED Final  . Cocaine Metabolite,Ur [place] 04/28/2018 NONE DETECTED  NONE DETECTED Final  . Opiate, Ur Screen 04/28/2018 POSITIVE* NONE DETECTED Final  . Phencyclidine (PCP) Ur S 04/28/2018 NONE DETECTED  NONE  DETECTED Final  . Cannabinoid 50 Ng, Ur [place] 04/28/2018 NONE DETECTED  NONE DETECTED Final  . Barbiturates, Ur Screen 04/28/2018 NONE DETECTED  NONE DETECTED Final  . Benzodiazepine, Ur Scrn 04/28/2018 POSITIVE* NONE DETECTED Final  . Methadone Scn, Ur 04/28/2018 NONE DETECTED  NONE DETECTED Final   Comment: (NOTE) Tricyclics + metabolites, urine    Cutoff 1000 ng/mL Amphetamines + metabolites, urine  Cutoff 1000 ng/mL MDMA (Ecstasy), urine              Cutoff 500 ng/mL Cocaine Metabolite, urine          Cutoff 300 ng/mL Opiate + metabolites, urine        Cutoff 300 ng/mL Phencyclidine (PCP), urine         Cutoff 25 ng/mL Cannabinoid,  urine                 Cutoff 50 ng/mL Barbiturates + metabolites, urine  Cutoff 200 ng/mL Benzodiazepine, urine              Cutoff 200 ng/mL Methadone, urine                   Cutoff 300 ng/mL The urine drug screen provides only a preliminary, unconfirmed analytical test result and should not be used for non-medical purposes. Clinical consideration and professional judgment should be applied to any positive drug screen result due to possible interfering substances. A more specific alternate chemical method must be used in order to obtain a confirmed analytical result. Gas chromatography / mass spectrometry (GC/MS) is the preferred confirmat                          ory method. Performed at [facility], [address] [place], [place] [address]   . Glucose-Capillary 04/28/2018 88  70 - 99 mg/dL Final  . TSH 60/45/409803/27/2020 0.531  0.350 - 4.500 uIU/mL Final   Comment: Performed by a 3rd Generation assay with a functional sensitivity of <=0.01 uIU/mL. Performed at [facility], [address] [place], [place] [address]   . Vitamin B-12 04/28/2018 696  180 - 914 pg/mL Final   Comment: (NOTE) This assay is not validated for testing neonatal or myeloproliferative syndrome specimens for Vitamin B12 levels. Performed at [facility], [address] [place], [place] [address]   . 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 Diagnoses of Vitamin D deficiency, Chronic pain syndrome, Chronic musculoskeletal pain, and Cervical spondylosis were also pertinent to this visit.  Plan of Care  I am having Riley L. Dayton ScrapeMurray "Becky" maintain her atenolol, lisinopril, ibuprofen, aspirin EC, amLODipine, gabapentin, Calcium Plus D3 Absorbable, oxyCODONE, oxyCODONE, and oxyCODONE.  Pharmacotherapy (Medications  Ordered): Meds ordered this encounter  Medications  . gabapentin (NEURONTIN) 100 MG capsule    Sig: Take 2 capsules (200 mg total) by mouth 3 (three) times daily.    Dispense:  180 capsule    Refill:  2    Do not place medication on "Automatic Refill". Fill one day early if pharmacy is closed on scheduled refill date.    Order Specific Question:   Supervising Provider    Answer:   Delano MetzNAVEIRA, FRANCISCO [phone][[id number]]  . Calcium Carb-Cholecalciferol (CALCIUM PLUS D3 ABSORBABLE) [phone] MG-UNIT CAPS    Sig: Take 1 capsule by mouth  daily with breakfast.    Dispense:  90 capsule    Refill:  0    Do not place medication on "Automatic Refill". Fill one day early if pharmacy is closed on scheduled refill date.    Order Specific Question:   Supervising Provider    Answer:   Delano Metz [phone]  . oxyCODONE (OXY IR/ROXICODONE) 5 MG immediate release tablet    Sig: Take 1 tablet (5 mg total) by mouth every 6 (six) hours as needed for up to 30 days for severe pain.    Dispense:  120 tablet    Refill:  0    Do not place this medication, or any other prescription from our practice, on "Automatic Refill". Patient may have prescription filled one day early if pharmacy is closed on scheduled refill date.    Order Specific Question:   Supervising Provider    Answer:   Delano Metz [phone]  . oxyCODONE (OXY IR/ROXICODONE) 5 MG immediate release tablet    Sig: Take 1 tablet (5 mg total) by mouth every 6 (six) hours as needed for up to 30 days for severe pain.    Dispense:  120 tablet    Refill:  0    Do not place this medication, or any other prescription from our practice, on "Automatic Refill". Patient may have prescription filled one day early if pharmacy is closed on scheduled refill date.    Order Specific Question:   Supervising Provider    Answer:   Delano Metz [phone]  . oxyCODONE (OXY IR/ROXICODONE) 5 MG immediate release tablet    Sig: Take 1 tablet (5 mg total) by mouth every 6  (six) hours as needed for up to 30 days for severe pain.    Dispense:  120 tablet    Refill:  0    Do not place this medication, or any other prescription from our practice, on "Automatic Refill". Patient may have prescription filled one day early if pharmacy is closed on scheduled refill date.    Order Specific Question:   Supervising Provider    Answer:   Delano Metz [phone]   Orders:  No orders of the defined types were placed in this encounter.  Follow-up plan:   Return in about 3 months (around 09/05/2018) for MedMgmt.   I discussed the assessment and treatment plan with the patient. The patient was provided an opportunity to ask questions and all were answered. The patient agreed with the plan and demonstrated an understanding of the instructions.  Patient advised to call back or seek an in-person evaluation if the symptoms or condition worsens.  Total duration of non-face-to-face encounter: 12 minutes.  Note by: Thad Ranger, NP Date: 06/05/2018; Time: 11:30 AM  Disclaimer:  * Given the special circumstances of the COVID-19 pandemic, the federal government has announced that the Office for Civil Rights (OCR) will exercise its enforcement discretion and will not impose penalties on physicians using telehealth in the event of noncompliance with regulatory requirements under the [employer] Portability and Accountability Act (HIPAA) in connection with the good faith provision of telehealth during the COVID-19 national public health emergency. (AMA)

## 2018-06-05 NOTE — Patient Instructions (Signed)
____________________________________________________________________________________________  Medication Rules  Purpose: To inform patients, and their family members, of our rules and regulations.  Applies to: All patients receiving prescriptions (written or electronic).  Pharmacy of record: Pharmacy where electronic prescriptions will be sent. If written prescriptions are taken to a different pharmacy, please inform the nursing staff. The pharmacy listed in the electronic medical record should be the one where you would like electronic prescriptions to be sent.  Electronic prescriptions: In compliance with the [place] Strengthen Opioid Misuse Prevention (STOP) Act of 2017 (Session Law 2017-74/H243), effective February 01, 2018, all controlled substances must be electronically prescribed. Calling prescriptions to the pharmacy will cease to exist.  Prescription refills: Only during scheduled appointments. Applies to all prescriptions.  NOTE: The following applies primarily to controlled substances (Opioid* Pain Medications).   Patient's responsibilities: 1. Pain Pills: Bring all pain pills to every appointment (except for procedure appointments). 2. Pill Bottles: Bring pills in original pharmacy bottle. Always bring the newest bottle. Bring bottle, even if empty. 3. Medication refills: You are responsible for knowing and keeping track of what medications you take and those you need refilled. The day before your appointment: write a list of all prescriptions that need to be refilled. The day of the appointment: give the list to the admitting nurse. Prescriptions will be written only during appointments. No prescriptions will be written on procedure days. If you forget a medication: it will not be "Called in", "Faxed", or "electronically sent". You will need to get another appointment to get these prescribed. No early refills. Do not call asking to have your prescription filled  early. 4. Prescription Accuracy: You are responsible for carefully inspecting your prescriptions before leaving our office. Have the discharge nurse carefully go over each prescription with you, before taking them home. Make sure that your name is accurately spelled, that your address is correct. Check the name and dose of your medication to make sure it is accurate. Check the number of pills, and the written instructions to make sure they are clear and accurate. Make sure that you are given enough medication to last until your next medication refill appointment. 5. Taking Medication: Take medication as prescribed. When it comes to controlled substances, taking less pills or less frequently than prescribed is permitted and encouraged. Never take more pills than instructed. Never take medication more frequently than prescribed.  6. Inform other Doctors: Always inform, all of your healthcare providers, of all the medications you take. 7. Pain Medication from other Providers: You are not allowed to accept any additional pain medication from any other Doctor or Healthcare provider. There are two exceptions to this rule. (see below) In the event that you require additional pain medication, you are responsible for notifying us, as stated below. 8. Medication Agreement: You are responsible for carefully reading and following our Medication Agreement. This must be signed before receiving any prescriptions from our practice. Safely store a copy of your signed Agreement. Violations to the Agreement will result in no further prescriptions. (Additional copies of our Medication Agreement are available upon request.) 9. Laws, Rules, & Regulations: All patients are expected to follow all Federal and State Laws, Statutes, Rules, & Regulations. Ignorance of the Laws does not constitute a valid excuse. The use of any illegal substances is prohibited. 10. Adopted CDC guidelines & recommendations: Target dosing levels will be  at or below 60 MME/day. Use of benzodiazepines** is not recommended.  Exceptions: There are only two exceptions to the rule of not   receiving pain medications from other Healthcare Providers. 1. Exception #1 (Emergencies): In the event of an emergency (i.e.: accident requiring emergency care), you are allowed to receive additional pain medication. However, you are responsible for: As soon as you are able, call our office [phone], at any time of the day or night, and leave a message stating your name, the date and nature of the emergency, and the name and dose of the medication prescribed. In the event that your call is answered by a member of our staff, make sure to document and save the date, time, and the name of the person that took your information.  2. Exception #2 (Planned Surgery): In the event that you are scheduled by another doctor or dentist to have any type of surgery or procedure, you are allowed (for a period no longer than 30 days), to receive additional pain medication, for the acute post-op pain. However, in this case, you are responsible for picking up a copy of our "Post-op Pain Management for Surgeons" handout, and giving it to your surgeon or dentist. This document is available at our office, and does not require an appointment to obtain it. Simply go to our office during business hours (Monday-Thursday from 8:00 AM to 4:00 PM) (Friday 8:00 AM to 12:00 Noon) or if you have a scheduled appointment with us, prior to your surgery, and ask for it by name. In addition, you will need to provide us with your name, name of your surgeon, type of surgery, and date of procedure or surgery.  *Opioid medications include: morphine, codeine, oxycodone, oxymorphone, hydrocodone, hydromorphone, meperidine, tramadol, tapentadol, buprenorphine, fentanyl, methadone. **Benzodiazepine medications include: diazepam (Valium), alprazolam (Xanax), clonazepam (Klonopine), lorazepam (Ativan), clorazepate  (Tranxene), chlordiazepoxide (Librium), estazolam (Prosom), oxazepam (Serax), temazepam (Restoril), triazolam (Halcion) (Last updated: 03/31/2017) ____________________________________________________________________________________________    

## 2018-06-27 ENCOUNTER — Other Ambulatory Visit: Payer: Self-pay | Admitting: Nurse Practitioner

## 2018-06-29 DIAGNOSIS — G600 Hereditary motor and sensory neuropathy: Secondary | ICD-10-CM | POA: Diagnosis not present

## 2018-06-29 DIAGNOSIS — I10 Essential (primary) hypertension: Secondary | ICD-10-CM | POA: Diagnosis not present

## 2018-07-04 DIAGNOSIS — R300 Dysuria: Secondary | ICD-10-CM | POA: Diagnosis not present

## 2018-07-04 DIAGNOSIS — R1031 Right lower quadrant pain: Secondary | ICD-10-CM | POA: Diagnosis not present

## 2018-07-04 DIAGNOSIS — R399 Unspecified symptoms and signs involving the genitourinary system: Secondary | ICD-10-CM | POA: Diagnosis not present

## 2018-07-04 DIAGNOSIS — R1032 Left lower quadrant pain: Secondary | ICD-10-CM | POA: Diagnosis not present

## 2018-07-29 IMAGING — MR MR HEAD W/O CM
10 series · 48 of 48 positions shown · non-contrast
Comparison: CT 09/18/2015.  MR brain 04/11/2014.

CLINICAL DATA: Weakness on LEFT side of face.  Frequent falls.

EXAM:
MRI HEAD WITHOUT CONTRAST
TECHNIQUE: Multiplanar, multiecho pulse sequences of the brain and surrounding
structures were obtained without intravenous contrast.

[Series 2: T1 · sagittal · 5.0mm · 0.45mm/px · 3 of 23 slices shown (1 of 2)]
[im 1/23]
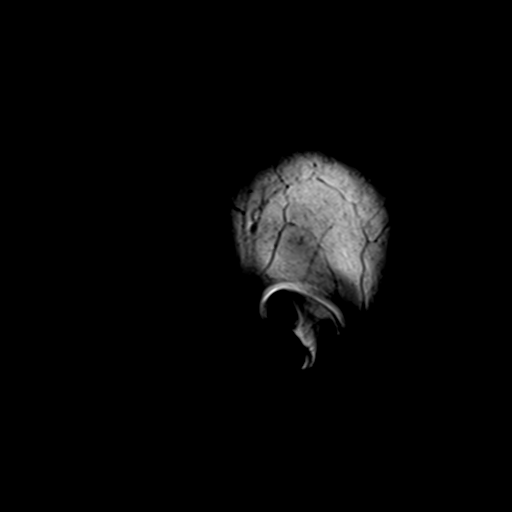
[im 12/23]
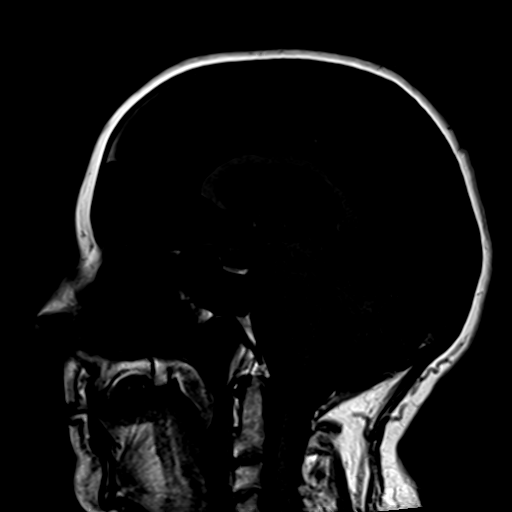
[im 23/23]
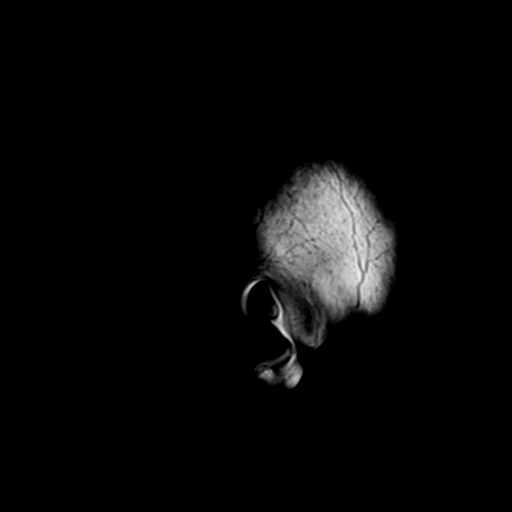

[Series 4: DWI · axial · 3.0mm · 1.20mm/px · z∈[-48,+111]mm · 7 of 55 slices shown (1 of 4)]
[im 1/55]
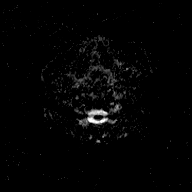
[im 10/55]
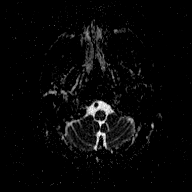
[im 19/55]
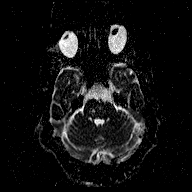
[im 28/55]
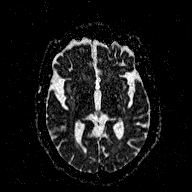
[im 37/55]
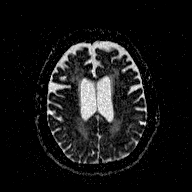
[im 46/55]
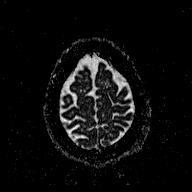
[im 55/55]
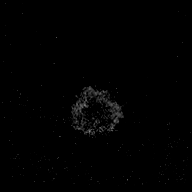

[Series 6: DWI · coronal · 3.0mm · 1.20mm/px · 5 of 45 slices shown (2 of 4)]
[im 1/45]
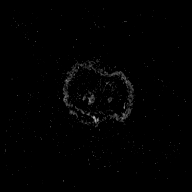
[im 12/45]
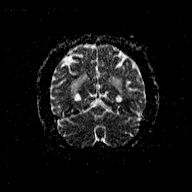
[im 23/45]
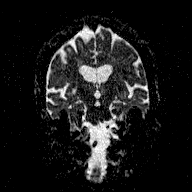
[im 34/45]
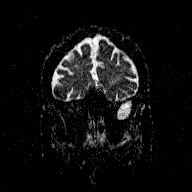
[im 45/45]
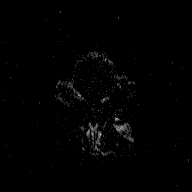

[Series 7: T2 · axial · 5.0mm · 0.72mm/px · z∈[-49,+111]mm · 3 of 26 slices shown (1 of 3)]
[im 1/26]
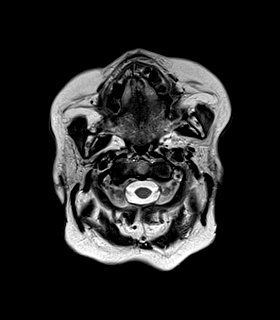
[im 13/26]
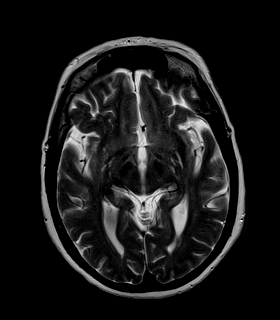
[im 26/26]
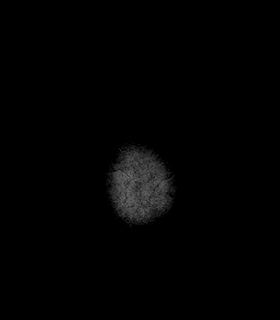

[Series 8: FLAIR · axial · 5.0mm · 0.45mm/px · z∈[-49,+111]mm · 3 of 26 slices shown]
[im 1/26]
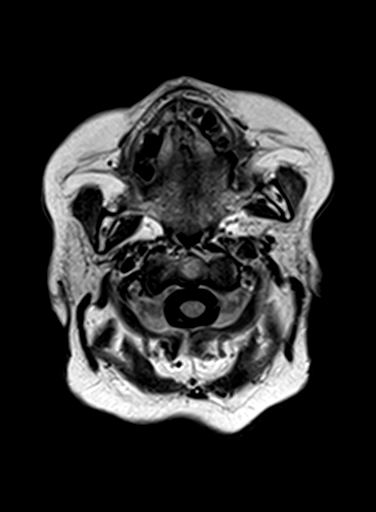
[im 13/26]
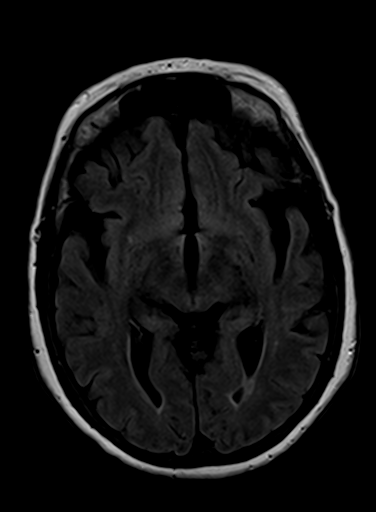
[im 26/26]
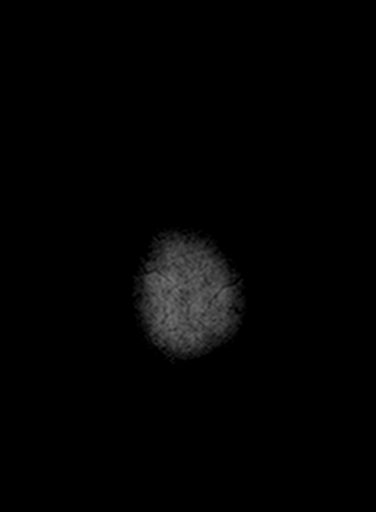

[Series 9: T2 · axial · 5.0mm · 0.72mm/px · z∈[-49,+111]mm · 3 of 26 slices shown (2 of 3)]
[im 1/26]
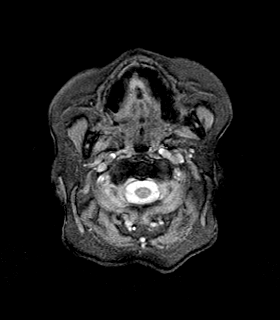
[im 13/26]
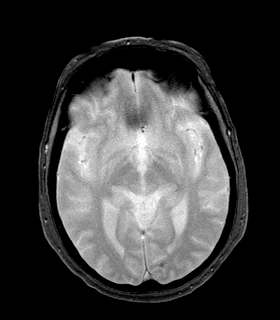
[im 26/26]
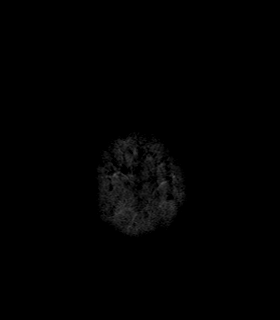

[Series 10: T1 · axial · 3.0mm · 1.00mm/px · z∈[-60,+126]mm · 8 of 64 slices shown (2 of 2)]
[im 1/64]
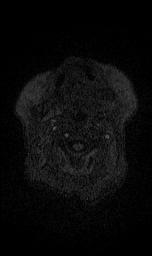
[im 10/64]
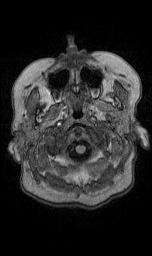
[im 19/64]
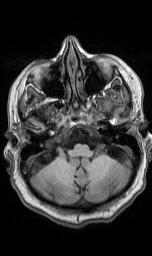
[im 28/64]
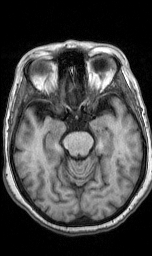
[im 37/64]
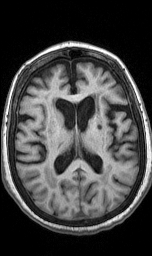
[im 46/64]
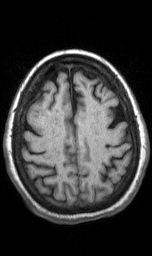
[im 55/64]
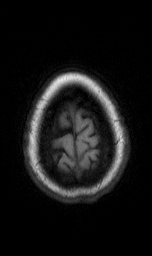
[im 64/64]
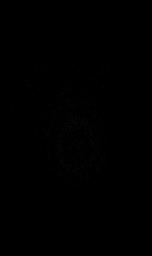

[Series 11: T2 · coronal · 5.0mm · 0.45mm/px · 4 of 29 slices shown (3 of 3)]
[im 1/29]
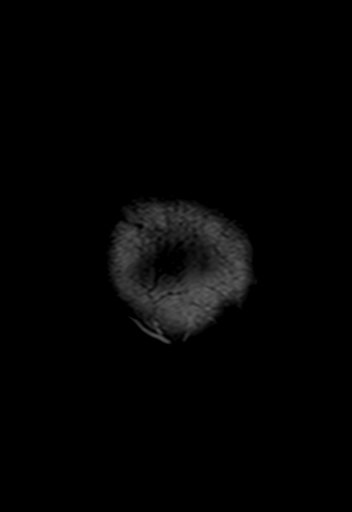
[im 10/29]
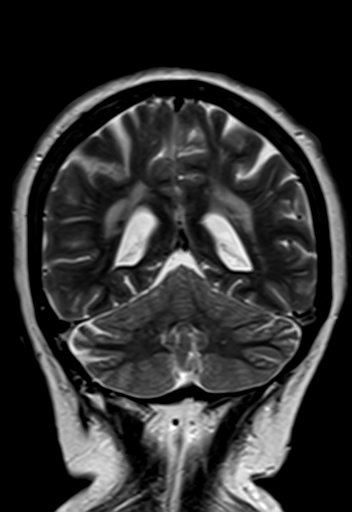
[im 19/29]
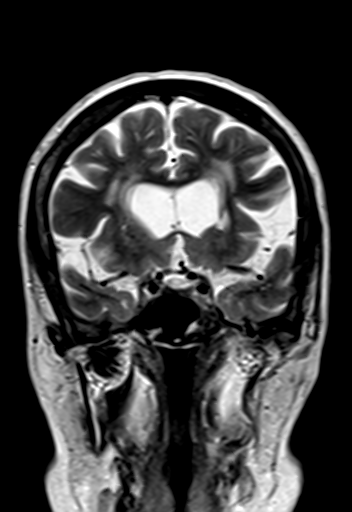
[im 29/29]
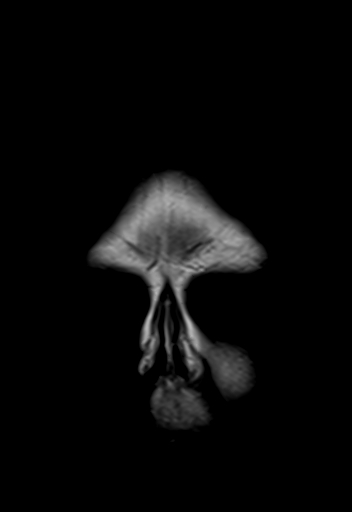

[Series 100: DWI · axial · 3.0mm · 1.20mm/px · z∈[-48,+111]mm · 7 of 55 slices shown (3 of 4)]
[im 1/55]
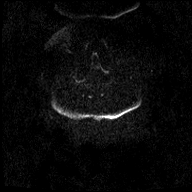
[im 10/55]
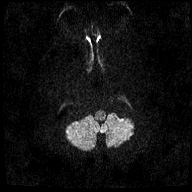
[im 19/55]
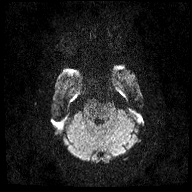
[im 28/55]
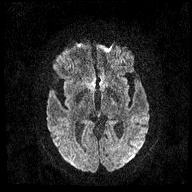
[im 37/55]
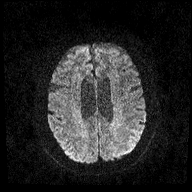
[im 46/55]
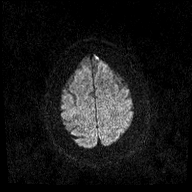
[im 55/55]
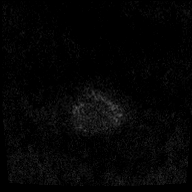

[Series 101: DWI · coronal · 3.0mm · 1.20mm/px · 5 of 45 slices shown (4 of 4)]
[im 1/45]
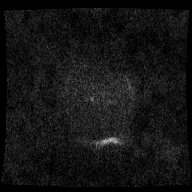
[im 12/45]
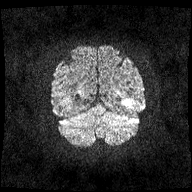
[im 23/45]
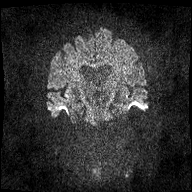
[im 34/45]
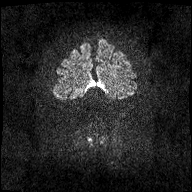
[im 45/45]
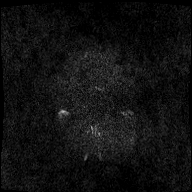

[48 of 48 positions shown; findings below may reference images not displayed]

FINDINGS: No evidence for acute infarction, hemorrhage, mass lesion,
hydrocephalus, or extra-axial fluid. Premature for age cerebral and
cerebellar atrophy. Advanced T2 and FLAIR hyperintensities
throughout the white matter, representing chronic microvascular
ischemic change. Widespread areas of lacunar infarction throughout
the cerebral hemispheres, deep nuclei, and brainstem.

Flow voids are maintained throughout the carotid, basilar, and
vertebral arteries. There are no areas of chronic hemorrhage; RIGHT
vertebral dominant.

Pituitary, pineal, and cerebellar tonsils unremarkable. No upper
cervical lesions.

Visualized calvarium, skull base, and upper cervical osseous
structures unremarkable. Scalp and extracranial soft tissues,
orbits, sinuses, and mastoids show no acute process.

Compared with priors, the RIGHT lentiform nucleus infarct was acute.
IMPRESSION: Premature for age atrophy. Advanced cerebrovascular disease, with
chronic microvascular ischemic change, as well as widespread areas
of lacunar infarction.

No acute intracranial findings are evident to explain the reported
symptoms.

## 2018-08-17 ENCOUNTER — Ambulatory Visit: Payer: Self-pay | Admitting: Urology

## 2018-08-31 ENCOUNTER — Encounter: Payer: Self-pay | Admitting: Pain Medicine

## 2018-09-01 DIAGNOSIS — M545 Low back pain: Secondary | ICD-10-CM | POA: Diagnosis not present

## 2018-09-03 DIAGNOSIS — M792 Neuralgia and neuritis, unspecified: Secondary | ICD-10-CM | POA: Insufficient documentation

## 2018-09-03 DIAGNOSIS — G8929 Other chronic pain: Secondary | ICD-10-CM | POA: Insufficient documentation

## 2018-09-03 NOTE — Patient Instructions (Signed)
____________________________________________________________________________________________  Medication Rules  Purpose: To inform patients, and their family members, of our rules and regulations.  Applies to: All patients receiving prescriptions (written or electronic).  Pharmacy of record: Pharmacy where electronic prescriptions will be sent. If written prescriptions are taken to a different pharmacy, please inform the nursing staff. The pharmacy listed in the electronic medical record should be the one where you would like electronic prescriptions to be sent.  Electronic prescriptions: In compliance with the [place] Strengthen Opioid Misuse Prevention (STOP) Act of 2017 (Session Law 2017-74/H243), effective February 01, 2018, all controlled substances must be electronically prescribed. Calling prescriptions to the pharmacy will cease to exist.  Prescription refills: Only during scheduled appointments. Applies to all prescriptions.  NOTE: The following applies primarily to controlled substances (Opioid* Pain Medications).   Patient's responsibilities: 1. Pain Pills: Bring all pain pills to every appointment (except for procedure appointments). 2. Pill Bottles: Bring pills in original pharmacy bottle. Always bring the newest bottle. Bring bottle, even if empty. 3. Medication refills: You are responsible for knowing and keeping track of what medications you take and those you need refilled. The day before your appointment: write a list of all prescriptions that need to be refilled. The day of the appointment: give the list to the admitting nurse. Prescriptions will be written only during appointments. No prescriptions will be written on procedure days. If you forget a medication: it will not be "Called in", "Faxed", or "electronically sent". You will need to get another appointment to get these prescribed. No early refills. Do not call asking to have your prescription filled  early. 4. Prescription Accuracy: You are responsible for carefully inspecting your prescriptions before leaving our office. Have the discharge nurse carefully go over each prescription with you, before taking them home. Make sure that your name is accurately spelled, that your address is correct. Check the name and dose of your medication to make sure it is accurate. Check the number of pills, and the written instructions to make sure they are clear and accurate. Make sure that you are given enough medication to last until your next medication refill appointment. 5. Taking Medication: Take medication as prescribed. When it comes to controlled substances, taking less pills or less frequently than prescribed is permitted and encouraged. Never take more pills than instructed. Never take medication more frequently than prescribed.  6. Inform other Doctors: Always inform, all of your healthcare providers, of all the medications you take. 7. Pain Medication from other Providers: You are not allowed to accept any additional pain medication from any other Doctor or Healthcare provider. There are two exceptions to this rule. (see below) In the event that you require additional pain medication, you are responsible for notifying us, as stated below. 8. Medication Agreement: You are responsible for carefully reading and following our Medication Agreement. This must be signed before receiving any prescriptions from our practice. Safely store a copy of your signed Agreement. Violations to the Agreement will result in no further prescriptions. (Additional copies of our Medication Agreement are available upon request.) 9. Laws, Rules, & Regulations: All patients are expected to follow all Federal and State Laws, Statutes, Rules, & Regulations. Ignorance of the Laws does not constitute a valid excuse. The use of any illegal substances is prohibited. 10. Adopted CDC guidelines & recommendations: Target dosing levels will be  at or below 60 MME/day. Use of benzodiazepines** is not recommended.  Exceptions: There are only two exceptions to the rule of not   receiving pain medications from other Healthcare Providers. 1. Exception #1 (Emergencies): In the event of an emergency (i.e.: accident requiring emergency care), you are allowed to receive additional pain medication. However, you are responsible for: As soon as you are able, call our office [phone], at any time of the day or night, and leave a message stating your name, the date and nature of the emergency, and the name and dose of the medication prescribed. In the event that your call is answered by a member of our staff, make sure to document and save the date, time, and the name of the person that took your information.  2. Exception #2 (Planned Surgery): In the event that you are scheduled by another doctor or dentist to have any type of surgery or procedure, you are allowed (for a period no longer than 30 days), to receive additional pain medication, for the acute post-op pain. However, in this case, you are responsible for picking up a copy of our "Post-op Pain Management for Surgeons" handout, and giving it to your surgeon or dentist. This document is available at our office, and does not require an appointment to obtain it. Simply go to our office during business hours (Monday-Thursday from 8:00 AM to 4:00 PM) (Friday 8:00 AM to 12:00 Noon) or if you have a scheduled appointment with us, prior to your surgery, and ask for it by name. In addition, you will need to provide us with your name, name of your surgeon, type of surgery, and date of procedure or surgery.  *Opioid medications include: morphine, codeine, oxycodone, oxymorphone, hydrocodone, hydromorphone, meperidine, tramadol, tapentadol, buprenorphine, fentanyl, methadone. **Benzodiazepine medications include: diazepam (Valium), alprazolam (Xanax), clonazepam (Klonopine), lorazepam (Ativan), clorazepate  (Tranxene), chlordiazepoxide (Librium), estazolam (Prosom), oxazepam (Serax), temazepam (Restoril), triazolam (Halcion) (Last updated: 03/31/2017) ____________________________________________________________________________________________   ____________________________________________________________________________________________  Medication Recommendations and Reminders  Applies to: All patients receiving prescriptions (written and/or electronic).  Medication Rules & Regulations: These rules and regulations exist for your safety and that of others. They are not flexible and neither are we. Dismissing or ignoring them will be considered "non-compliance" with medication therapy, resulting in complete and irreversible termination of such therapy. (See document titled "Medication Rules" for more details.) In all conscience, because of safety reasons, we cannot continue providing a therapy where the patient does not follow instructions.  Pharmacy of record:   Definition: This is the pharmacy where your electronic prescriptions will be sent.   We do not endorse any particular pharmacy.  You are not restricted in your choice of pharmacy.  The pharmacy listed in the electronic medical record should be the one where you want electronic prescriptions to be sent.  If you choose to change pharmacy, simply notify our nursing staff of your choice of new pharmacy.  Recommendations:  Keep all of your pain medications in a safe place, under lock and key, even if you live alone.   After you fill your prescription, take 1 week's worth of pills and put them away in a safe place. You should keep a separate, properly labeled bottle for this purpose. The remainder should be kept in the original bottle. Use this as your primary supply, until it runs out. Once it's gone, then you know that you have 1 week's worth of medicine, and it is time to come in for a prescription refill. If you do this correctly, it  is unlikely that you will ever run out of medicine.  To make sure that the above recommendation works,   it is very important that you make sure your medication refill appointments are scheduled at least 1 week before you run out of medicine. To do this in an effective manner, make sure that you do not leave the office without scheduling your next medication management appointment. Always ask the nursing staff to show you in your prescription , when your medication will be running out. Then arrange for the receptionist to get you a return appointment, at least 7 days before you run out of medicine. Do not wait until you have 1 or 2 pills left, to come in. This is very poor planning and does not take into consideration that we may need to cancel appointments due to bad weather, sickness, or emergencies affecting our staff.  "Partial Fill": If for any reason your pharmacy does not have enough pills/tablets to completely fill or refill your prescription, do not allow for a "partial fill". You will need a separate prescription to fill the remaining amount, which we will not provide. If the reason for the partial fill is your insurance, you will need to talk to the pharmacist about payment alternatives for the remaining tablets, but again, do not accept a partial fill.  Prescription refills and/or changes in medication(s):   Prescription refills, and/or changes in dose or medication, will be conducted only during scheduled medication management appointments. (Applies to both, written and electronic prescriptions.)  No refills on procedure days. No medication will be changed or started on procedure days. No changes, adjustments, and/or refills will be conducted on a procedure day. Doing so will interfere with the diagnostic portion of the procedure.  No phone refills. No medications will be "called into the pharmacy".  No Fax refills.  No weekend refills.  No Holliday refills.  No after hours  refills.  Remember:  Business hours are:  Monday to Thursday 8:00 AM to 4:00 PM Provider's Schedule: Crystal King, NP - Appointments are:  Medication management: Monday to Thursday 8:00 AM to 4:00 PM Delylah Stanczyk, MD - Appointments are:  Medication management: Monday and Wednesday 8:00 AM to 4:00 PM Procedure day: Tuesday and Thursday 7:30 AM to 4:00 PM Bilal Lateef, MD - Appointments are:  Medication management: Tuesday and Thursday 8:00 AM to 4:00 PM Procedure day: Monday and Wednesday 7:30 AM to 4:00 PM (Last update: 03/31/2017) ____________________________________________________________________________________________   ____________________________________________________________________________________________  CANNABIDIOL (AKA: CBD Oil or Pills)  Applies to: All patients receiving prescriptions of controlled substances (written and/or electronic).  General Information: Cannabidiol (CBD) was discovered in 1940. It is one of some 113 identified cannabinoids in cannabis (Marijuana) plants, accounting for up to 40% of the plant's extract. As of 2018, preliminary clinical research on cannabidiol included studies of anxiety, cognition, movement disorders, and pain.  Cannabidiol is consummed in multiple ways, including inhalation of cannabis smoke or vapor, as an aerosol spray into the cheek, and by mouth. It may be supplied as CBD oil containing CBD as the active ingredient (no added tetrahydrocannabinol (THC) or terpenes), a full-plant CBD-dominant hemp extract oil, capsules, dried cannabis, or as a liquid solution. CBD is thought not have the same psychoactivity as THC, and may affect the actions of THC. Studies suggest that CBD may interact with different biological targets, including cannabinoid receptors and other neurotransmitter receptors. As of 2018 the mechanism of action for its biological effects has not been determined.  In the [place], cannabidiol has a limited  approval by the Food and Drug Administration (FDA) for treatment of only two types   of epilepsy disorders. The side effects of long-term use of the drug include somnolence, decreased appetite, diarrhea, fatigue, malaise, weakness, sleeping problems, and others.  CBD remains a Schedule I drug prohibited for any use.  Legality: Some manufacturers ship CBD products nationally, an illegal action which the FDA has not enforced in 2018, with CBD remaining the subject of an FDA investigational new drug evaluation, and is not considered legal as a dietary supplement or food ingredient as of December 2018. Federal illegality has made it difficult historically to conduct research on CBD. CBD is openly sold in head shops and health food stores in some states where such sales have not been explicitly legalized.  Warning: Because it is not FDA approved for general use or treatment of pain, it is not required to undergo the same manufacturing controls as prescription drugs.  This means that the available cannabidiol (CBD) may be contaminated with THC.  If this is the case, it will trigger a positive urine drug screen (UDS) test for cannabinoids (Marijuana).  Because a positive UDS for illicit substances is a violation of our medication agreement, your opioid analgesics (pain medicine) may be permanently discontinued. (Last update: 04/21/2017) ____________________________________________________________________________________________    

## 2018-09-03 NOTE — Progress Notes (Signed)
Pain Management Virtual Encounter Note - Virtual Visit via Telephone Telehealth (real-time audio visits between healthcare provider and patient).   Patient's Phone No. & Preferred Pharmacy:  [phone] (home); [phone] (mobile); (Preferred) [phone] No e-mail address on record  [place], [place] [address] [place] [address] Phone: [phone] Fax: [phone]    Pre-screening note:  Our staff contacted Maria Wheeler and offered her an "in person", "face-to-face" appointment versus a telephone encounter. She indicated preferring the telephone encounter, at this time.   Reason for Virtual Visit: COVID-19*  Social distancing based on CDC and AMA recommendations.   I contacted Maria Wheeler on 09/04/2018 via telephone.      I clearly identified myself as Gaspar Cola, MD. I verified that I was speaking with the correct person using two identifiers (Name: Maria Wheeler, and date of birth: 24-May-1955).  Advanced Informed Consent I sought verbal advanced consent from Maria Wheeler for virtual visit interactions. I informed Maria Wheeler of possible security and privacy concerns, risks, and limitations associated with providing "not-in-person" medical evaluation and management services. I also informed Maria Wheeler of the availability of "in-person" appointments. Finally, I informed her that there would be a charge for the virtual visit and that she could be  personally, fully or partially, financially responsible for it. Maria Wheeler expressed understanding and agreed to proceed.   Historic Elements   Maria Wheeler is a 63 y.o. year old, female patient evaluated today after her last encounter by our practice on 06/27/2018. Maria Wheeler  has a past medical history of Abdominal pain (12/22/2012), Abnormal EKG (12/11/2011), Allergy, Altered mental status (12/11/2011), Anxiety, Benzodiazepine dependence ([place]) (12/14/2011),  Charcot-Marie disease, Chronic anxiety (12/14/2011), Dehydration (08/03/7104), Diastolic dysfunction (26/94/8546), Drug withdrawal ([place]) (12/12/2011), Hypertension, Hypokalemia (12/11/2011), and Psychosis ([place]) (12/12/2011). She also  has a past surgical history that includes Abdominal hysterectomy. Maria Wheeler has a current medication list which includes the following prescription(s): amlodipine, aspirin ec, atenolol, calcium plus d3 absorbable, gabapentin, ibuprofen, lisinopril, oxycodone, oxycodone, and oxycodone. She  reports that she has never smoked. She has never used smokeless tobacco. She reports current alcohol use of about 1.0 standard drinks of alcohol per week. She reports that she does not use drugs. Maria Wheeler is allergic to penicillins.   HPI  Today, she is being contacted for medication management. According to the PMP, she received Hydrocodone/APAP 5/325 #9 form Damita Lack, NP-C, on 09/01/2018. She last had our last 06/05/2018 Oxycodone 5 mg #120 prescription filled on 08/10/2018, which should have lasted until 09/09/2018.  Today I had a very long conversation with this patient about taking any additional pain medicines without checking with [place] first.  According to her, she received this medication at an urgent care facility when she went to be checked because of a fall that she suffered while she was in the bathroom.  She indicated that she had so much pain that she was unable to walk and that is why they gave her the additional pain medicine.  I reminded the patient that she needs to call whenever she gets additional pain medicine and she also needs to be extremely careful with the use of her opioid analgesics and any other medication that can cause a drug to drug interaction that may lead to respiratory depression and death.  I was very clear with her about my concerns with her use of the lorazepam in combination with the  opioids.  I informed the patient regarding the CDC guidelines  associated with this topic and her increased risk of respiratory depression and death when combining this type of drugs.  She indicated understanding.  Pharmacotherapy Assessment  Analgesic: Oxycodone IR 5 mg, 1 tab PO q 6 hrs PRN (20mg /dayof oxycodone) MME/day:30mg /day.   Monitoring: Pharmacotherapy: No side-effects or adverse reactions reported. [place] PMP: PDMP reviewed during this encounter.        Compliance: Medication agreement violation - unsanctioned acquisition/use of additional opioid-containing medication. Effectiveness: Clinically acceptable. Plan: Refer to "POC".  Pertinent Labs   SAFETY SCREENING Profile No results found for: SARSCOV2NAA, COVIDSOURCE, STAPHAUREUS, MRSAPCR, HCVAB, HIV, PREGTESTUR Renal Function Lab Results  Component Value Date   BUN 8 04/28/2018   CREATININE 1.03 (H) 04/28/2018   BCR 21 11/10/2016   GFRAA >60 04/28/2018   GFRNONAA 57 (L) 04/28/2018   Hepatic Function Lab Results  Component Value Date   AST 22 03/30/2018   ALT 8 03/30/2018   ALBUMIN 4.2 03/30/2018   UDS Summary  Date Value Ref Range Status  08/30/2017 FINAL  Final    Comment:    ==================================================================== TOXASSURE SELECT 13 (MW) ==================================================================== Test                             Result       Flag       Units Drug Present and Declared for Prescription Verification   Oxycodone                      1004         EXPECTED   ng/mg creat   Oxymorphone                    473          EXPECTED   ng/mg creat   Noroxycodone                   1144         EXPECTED   ng/mg creat   Noroxymorphone                 136          EXPECTED   ng/mg creat    Sources of oxycodone are scheduled prescription medications.    Oxymorphone, noroxycodone, and noroxymorphone are expected    metabolites of oxycodone. Oxymorphone is also available as a    scheduled prescription medication. Drug Present not  Declared for Prescription Verification   Lorazepam                      481          UNEXPECTED ng/mg creat    Source of lorazepam is a scheduled prescription medication. ==================================================================== Test                      Result    Flag   Units      Ref Range   Creatinine              213              mg/dL      >=09>=20 ==================================================================== Declared Medications:  The flagging and interpretation on this report are based on the  following declared medications.  Unexpected results may arise from  inaccuracies in the declared medications.  **Note: The testing  scope of this panel includes these medications:  Oxycodone  **Note: The testing scope of this panel does not include following  reported medications:  Amlodipine Besylate  Aspirin (Aspirin 81)  Atenolol  Calcium carbonate (Calcium carbonate/Vitamin D3)  Gabapentin  Ibuprofen  Lisinopril  Vitamin D3 (Calcium carbonate/Vitamin D3) ==================================================================== For clinical consultation, please call [phone]. ====================================================================    Note: Above Lab results reviewed.  Recent imaging  CT Head Wo Contrast CLINICAL DATA:  Confusion.  Worsened mental status.  EXAM: CT HEAD WITHOUT CONTRAST  TECHNIQUE: Contiguous axial images were obtained from the base of the skull through the vertex without intravenous contrast.  COMPARISON:  03/30/2018  FINDINGS: Brain: Mild generalized atrophy. Chronic small-vessel ischemic changes of the cerebral hemispheric white matter. 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 Old small vessel insults of the brainstem. No evidence of acute or subacute infarction, mass lesion, hemorrhage, hydrocephalus or extra-axial collection.  Vascular: There is atherosclerotic calcification of the major vessels at the base of the  brain.  Skull: Negative  Sinuses/Orbits: Clear/normal  Other: None  IMPRESSION: No acute finding by CT. Chronic atrophy. Chronic small-vessel ischemic changes of the brainstem, basal ganglia and hemispheric white matter  Electronically Signed   By: Paulina FusiMark  Shogry M.D.   On: 04/28/2018 17:19  Assessment  The primary encounter diagnosis was Chronic pain syndrome. Diagnoses of Chronic lower extremity pain (Primary Area of Pain) (Bilateral) (L>R), Chronic upper extremity pain (Secondary Area of Pain) (Bilateral) (L>R), Chronic neck pain (Tertiary Area of Pain) (Posterior) (Bilateral) (L>R), Chronic upper extremity polyneuropathy (severe) (CMT), Vitamin D deficiency, CMT (Charcot-Marie-Tooth disease), Neurogenic pain, Chronic neuropathic pain, Long term prescription benzodiazepine use, Long term prescription opiate use, and Pharmacologic therapy were also pertinent to this visit.  Plan of Care  I have discontinued Maria Wheeler "Becky"'s oxyCODONE and oxyCODONE. I have also changed her gabapentin and oxyCODONE. Additionally, I am having her start on oxyCODONE and oxyCODONE. Lastly, I am having her maintain her atenolol, lisinopril, ibuprofen, aspirin EC, amLODipine, and Calcium Plus D3 Absorbable.  Pharmacotherapy (Medications Ordered): Meds ordered this encounter  Medications  . Calcium Carb-Cholecalciferol (CALCIUM PLUS D3 ABSORBABLE) [phone] MG-UNIT CAPS    Sig: Take 1 capsule by mouth daily with breakfast.    Dispense:  30 capsule    Refill:  PRN    Fill one day early if pharmacy is closed on scheduled refill date. May substitute for generic if available.  . gabapentin (NEURONTIN) 100 MG capsule    Sig: Take 2 capsules (200 mg total) by mouth 2 (two) times daily AND 3 capsules (300 mg total) at bedtime.    Dispense:  210 capsule    Refill:  2    Fill one day early if pharmacy is closed on scheduled refill date. May substitute for generic if available.  Marland Kitchen. oxyCODONE (OXY  IR/ROXICODONE) 5 MG immediate release tablet    Sig: Take 1 tablet (5 mg total) by mouth every 6 (six) hours as needed for severe pain. Must last 30 days    Dispense:  120 tablet    Refill:  0    Chronic Pain: STOP Act (Not applicable) Fill 1 day early if closed on refill date. Do not fill until: 09/09/2018. To last until: 10/09/2018. Avoid benzodiazepines within 8 hours of opioids  . oxyCODONE (OXY IR/ROXICODONE) 5 MG immediate release tablet    Sig: Take 1 tablet (5 mg total) by mouth every 6 (six) hours as needed for severe pain. Must last 30 days  Dispense:  120 tablet    Refill:  0    Chronic Pain: STOP Act (Not applicable) Fill 1 day early if closed on refill date. Do not fill until: 10/09/2018. To last until: 11/08/2018. Avoid benzodiazepines within 8 hours of opioids  . oxyCODONE (OXY IR/ROXICODONE) 5 MG immediate release tablet    Sig: Take 1 tablet (5 mg total) by mouth every 6 (six) hours as needed for severe pain. Must last 30 days    Dispense:  120 tablet    Refill:  0    Chronic Pain: STOP Act (Not applicable) Fill 1 day early if closed on refill date. Do not fill until: 11/08/2018. To last until: 12/08/2018. Avoid benzodiazepines within 8 hours of opioids   Orders:  No orders of the defined types were placed in this encounter.  Follow-up plan:   Return in about 3 months (around 12/06/2018) for (VV), E/M (MM).      Interventional management options: Planned, scheduled, and/or pending:   Agreement violation (09/01/18) additional HYDROCODONE!!! Back to close monitoring.   Considering:   Diagnostic right-sided L5-S1 LESI Diagnostic bilateral lumbar facet block Possiblebilateral lumbar facet RFA Diagnostic left sided CESI  Diagnostic bilateral cervical facet block Possible bilateral cervical facet RFA Diagnostic right shoulder injection Diagnostic right suprascapular nerve block Possible right sided suprascapular nerve RFA   Palliative PRN treatment(s):   Diagnostic  left sided CESI #2      Recent Visits No visits were found meeting these conditions.  Showing recent visits within past 90 days and meeting all other requirements   Today's Visits Date Type Provider Dept  09/04/18 Office Visit Delano MetzNaveira, Chakara Bognar, MD [facility]  Showing today's visits and meeting all other requirements   Future Appointments No visits were found meeting these conditions.  Showing future appointments within next 90 days and meeting all other requirements   I discussed the assessment and treatment plan with the patient. The patient was provided an opportunity to ask questions and all were answered. The patient agreed with the plan and demonstrated an understanding of the instructions.  Patient advised to call back or seek an in-person evaluation if the symptoms or condition worsens.  Total duration of non-face-to-face encounter: 15 minutes.  Note by: Oswaldo DoneFrancisco A Shaunette Gassner, MD Date: 09/04/2018; Time: 10:50 AM  Note: This dictation was prepared with Dragon dictation. Any transcriptional errors that may result from this process are unintentional.  Disclaimer:  * Given the special circumstances of the COVID-19 pandemic, the federal government has announced that the Office for Civil Rights (OCR) will exercise its enforcement discretion and will not impose penalties on physicians using telehealth in the event of noncompliance with regulatory requirements under the [employer] Insurance Portability and Accountability Act (HIPAA) in connection with the good faith provision of telehealth during the COVID-19 national public health emergency. (AMA)

## 2018-09-04 ENCOUNTER — Ambulatory Visit: Payer: Medicare Other | Attending: Pain Medicine | Admitting: Pain Medicine

## 2018-09-04 ENCOUNTER — Other Ambulatory Visit: Payer: Self-pay

## 2018-09-04 DIAGNOSIS — G894 Chronic pain syndrome: Secondary | ICD-10-CM | POA: Diagnosis not present

## 2018-09-04 DIAGNOSIS — M792 Neuralgia and neuritis, unspecified: Secondary | ICD-10-CM | POA: Diagnosis not present

## 2018-09-04 DIAGNOSIS — M542 Cervicalgia: Secondary | ICD-10-CM | POA: Diagnosis not present

## 2018-09-04 DIAGNOSIS — E559 Vitamin D deficiency, unspecified: Secondary | ICD-10-CM | POA: Diagnosis not present

## 2018-09-04 DIAGNOSIS — M79604 Pain in right leg: Secondary | ICD-10-CM

## 2018-09-04 DIAGNOSIS — G629 Polyneuropathy, unspecified: Secondary | ICD-10-CM

## 2018-09-04 DIAGNOSIS — M79601 Pain in right arm: Secondary | ICD-10-CM | POA: Diagnosis not present

## 2018-09-04 DIAGNOSIS — M79602 Pain in left arm: Secondary | ICD-10-CM

## 2018-09-04 DIAGNOSIS — Z79891 Long term (current) use of opiate analgesic: Secondary | ICD-10-CM | POA: Diagnosis not present

## 2018-09-04 DIAGNOSIS — Z79899 Other long term (current) drug therapy: Secondary | ICD-10-CM

## 2018-09-04 DIAGNOSIS — G8929 Other chronic pain: Secondary | ICD-10-CM

## 2018-09-04 DIAGNOSIS — M79605 Pain in left leg: Secondary | ICD-10-CM

## 2018-09-04 DIAGNOSIS — G600 Hereditary motor and sensory neuropathy: Secondary | ICD-10-CM

## 2018-09-04 MED ORDER — GABAPENTIN 100 MG PO CAPS: ORAL_CAPSULE | ORAL | 2 refills | Status: AC

## 2018-09-04 MED ORDER — CALCIUM PLUS D3 ABSORBABLE 600-2500 MG-UNIT PO CAPS: 1.0000 | ORAL_CAPSULE | Freq: Every day | ORAL | 99 refills | Status: AC

## 2018-09-04 MED ORDER — OXYCODONE HCL 5 MG PO TABS: 5.0000 mg | ORAL_TABLET | Freq: Four times a day (QID) | ORAL | 0 refills | Status: DC | PRN

## 2018-09-05 ENCOUNTER — Telehealth: Payer: Self-pay | Admitting: Pain Medicine

## 2018-09-05 NOTE — Telephone Encounter (Signed)
[place] called regarding script calcium. She can get 1200mg  but unable to get 2500mg . What do you want to do? Please call pharmacy     Renee [phone]

## 2018-09-05 NOTE — Telephone Encounter (Signed)
Called back to pharmacy and they will just use the 1200 x 2 to get the dosage up to what was ordered.

## 2018-09-26 ENCOUNTER — Emergency Department: Payer: Medicare Other

## 2018-09-26 ENCOUNTER — Inpatient Hospital Stay
Admission: EM | Admit: 2018-09-26 | Discharge: 2018-09-29 | DRG: 918 | Disposition: A | Discharge status: Other Acute Inpatient Hospital | Payer: Medicare Other | Attending: Internal Medicine | Admitting: Internal Medicine

## 2018-09-26 ENCOUNTER — Other Ambulatory Visit: Payer: Self-pay

## 2018-09-26 DIAGNOSIS — T424X1A Poisoning by benzodiazepines, accidental (unintentional), initial encounter: Principal | ICD-10-CM | POA: Diagnosis present

## 2018-09-26 DIAGNOSIS — Z7982 Long term (current) use of aspirin: Secondary | ICD-10-CM

## 2018-09-26 DIAGNOSIS — F13121 Sedative, hypnotic or anxiolytic abuse with intoxication delirium: Secondary | ICD-10-CM | POA: Diagnosis present

## 2018-09-26 DIAGNOSIS — R451 Restlessness and agitation: Secondary | ICD-10-CM | POA: Diagnosis not present

## 2018-09-26 DIAGNOSIS — I10 Essential (primary) hypertension: Secondary | ICD-10-CM | POA: Diagnosis present

## 2018-09-26 DIAGNOSIS — T424X4A Poisoning by benzodiazepines, undetermined, initial encounter: Secondary | ICD-10-CM | POA: Diagnosis not present

## 2018-09-26 DIAGNOSIS — F418 Other specified anxiety disorders: Secondary | ICD-10-CM | POA: Diagnosis present

## 2018-09-26 DIAGNOSIS — T50905A Adverse effect of unspecified drugs, medicaments and biological substances, initial encounter: Secondary | ICD-10-CM | POA: Diagnosis present

## 2018-09-26 DIAGNOSIS — R410 Disorientation, unspecified: Secondary | ICD-10-CM | POA: Diagnosis present

## 2018-09-26 DIAGNOSIS — I119 Hypertensive heart disease without heart failure: Secondary | ICD-10-CM | POA: Diagnosis present

## 2018-09-26 DIAGNOSIS — Z20828 Contact with and (suspected) exposure to other viral communicable diseases: Secondary | ICD-10-CM | POA: Diagnosis present

## 2018-09-26 DIAGNOSIS — Z8673 Personal history of transient ischemic attack (TIA), and cerebral infarction without residual deficits: Secondary | ICD-10-CM

## 2018-09-26 DIAGNOSIS — F1310 Sedative, hypnotic or anxiolytic abuse, uncomplicated: Secondary | ICD-10-CM | POA: Diagnosis present

## 2018-09-26 DIAGNOSIS — R5381 Other malaise: Secondary | ICD-10-CM | POA: Diagnosis not present

## 2018-09-26 DIAGNOSIS — J9811 Atelectasis: Secondary | ICD-10-CM | POA: Diagnosis not present

## 2018-09-26 DIAGNOSIS — Z8249 Family history of ischemic heart disease and other diseases of the circulatory system: Secondary | ICD-10-CM

## 2018-09-26 DIAGNOSIS — T50904A Poisoning by unspecified drugs, medicaments and biological substances, undetermined, initial encounter: Secondary | ICD-10-CM

## 2018-09-26 DIAGNOSIS — R0989 Other specified symptoms and signs involving the circulatory and respiratory systems: Secondary | ICD-10-CM | POA: Diagnosis not present

## 2018-09-26 DIAGNOSIS — R51 Headache: Secondary | ICD-10-CM | POA: Diagnosis not present

## 2018-09-26 DIAGNOSIS — F19921 Other psychoactive substance use, unspecified with intoxication with delirium: Secondary | ICD-10-CM | POA: Diagnosis not present

## 2018-09-26 DIAGNOSIS — R4182 Altered mental status, unspecified: Secondary | ICD-10-CM | POA: Diagnosis not present

## 2018-09-26 DIAGNOSIS — I5189 Other ill-defined heart diseases: Secondary | ICD-10-CM

## 2018-09-26 DIAGNOSIS — Y92039 Unspecified place in apartment as the place of occurrence of the external cause: Secondary | ICD-10-CM

## 2018-09-26 DIAGNOSIS — I517 Cardiomegaly: Secondary | ICD-10-CM | POA: Diagnosis not present

## 2018-09-26 LAB — CBC WITH DIFFERENTIAL/PLATELET
Abs Immature Granulocytes: 0.06 10*3/uL (ref 0.00–0.07)
Basophils Absolute: 0 10*3/uL (ref 0.0–0.1)
Basophils Relative: 0 %
Eosinophils Absolute: 0 10*3/uL (ref 0.0–0.5)
Eosinophils Relative: 0 %
HCT: 38.6 % (ref 36.0–46.0)
Hemoglobin: 12.5 g/dL (ref 12.0–15.0)
Immature Granulocytes: 1 %
Lymphocytes Relative: 12 %
Lymphs Abs: 1.2 10*3/uL (ref 0.7–4.0)
MCH: 31.2 pg (ref 26.0–34.0)
MCHC: 32.4 g/dL (ref 30.0–36.0)
MCV: 96.3 fL (ref 80.0–100.0)
Monocytes Absolute: 0.5 10*3/uL (ref 0.1–1.0)
Monocytes Relative: 5 %
Neutro Abs: 8.6 10*3/uL — ABNORMAL HIGH (ref 1.7–7.7)
Neutrophils Relative %: 82 %
Platelets: 228 10*3/uL (ref 150–400)
RBC: 4.01 MIL/uL (ref 3.87–5.11)
RDW: 14.1 % (ref 11.5–15.5)
WBC: 10.4 10*3/uL (ref 4.0–10.5)
nRBC: 0 % (ref 0.0–0.2)

## 2018-09-26 LAB — URINALYSIS, COMPLETE (UACMP) WITH MICROSCOPIC
Bacteria, UA: NONE SEEN
Bilirubin Urine: NEGATIVE
Glucose, UA: NEGATIVE mg/dL
Ketones, ur: 20 mg/dL — AB
Leukocytes,Ua: NEGATIVE
Nitrite: NEGATIVE
Protein, ur: NEGATIVE mg/dL
Specific Gravity, Urine: 1.006 (ref 1.005–1.030)
pH: 8 (ref 5.0–8.0)

## 2018-09-26 LAB — URINE DRUG SCREEN, QUALITATIVE (ARMC ONLY)
Amphetamines, Ur Screen: NOT DETECTED
Barbiturates, Ur Screen: NOT DETECTED
Benzodiazepine, Ur Scrn: POSITIVE — AB
Cannabinoid 50 Ng, Ur ~~LOC~~: NOT DETECTED
Cocaine Metabolite,Ur ~~LOC~~: NOT DETECTED
MDMA (Ecstasy)Ur Screen: NOT DETECTED
Methadone Scn, Ur: NOT DETECTED
Opiate, Ur Screen: NOT DETECTED
Phencyclidine (PCP) Ur S: NOT DETECTED
Tricyclic, Ur Screen: NOT DETECTED

## 2018-09-26 LAB — COMPREHENSIVE METABOLIC PANEL
ALT: 13 U/L (ref 0–44)
AST: 25 U/L (ref 15–41)
Albumin: 4.1 g/dL (ref 3.5–5.0)
Alkaline Phosphatase: 82 U/L (ref 38–126)
Anion gap: 13 (ref 5–15)
BUN: 9 mg/dL (ref 8–23)
CO2: 24 mmol/L (ref 22–32)
Calcium: 9.6 mg/dL (ref 8.9–10.3)
Chloride: 103 mmol/L (ref 98–111)
Creatinine, Ser: 0.8 mg/dL (ref 0.44–1.00)
GFR calc Af Amer: >60 mL/min (ref >60)
GFR calc non Af Amer: >60 mL/min (ref >60)
Glucose, Bld: 132 mg/dL — ABNORMAL HIGH (ref 70–99)
Potassium: 3.7 mmol/L (ref 3.5–5.1)
Sodium: 140 mmol/L (ref 135–145)
Total Bilirubin: 0.7 mg/dL (ref 0.3–1.2)
Total Protein: 7.2 g/dL (ref 6.5–8.1)

## 2018-09-26 LAB — ETHANOL: Alcohol, Ethyl (B): <10 mg/dL (ref <10)

## 2018-09-26 LAB — TROPONIN I (HIGH SENSITIVITY): Troponin I (High Sensitivity): 9 ng/L (ref <18)

## 2018-09-26 LAB — SALICYLATE LEVEL: Salicylate Lvl: <7 mg/dL (ref 2.8–30.0)

## 2018-09-26 LAB — ACETAMINOPHEN LEVEL: Acetaminophen (Tylenol), Serum: <10 ug/mL — ABNORMAL LOW (ref 10–30)

## 2018-09-26 NOTE — ED Notes (Signed)
ED TO INPATIENT HANDOFF REPORT  ED Nurse Name and Phone #:  Tom [phone]  S Name/Age/Gender Maria Wheeler 63 y.o. female Room/Bed: ED07A/ED07A  Code Status   Code Status: Prior  Home/SNF/Other Home Patient oriented to: self, place, time and situation Is this baseline? Yes   Triage Complete: Triage complete  Chief Complaint Overdose  Triage Note Pt arrived via [facility] from [facility] with possible overdose. pt had a prescription for ativan that was filled on 09/22/18 with 90 tablets originally in the bottle, with 21 left in bottle verified with Annie Main, Therapist, sports. Pt someulent, but calm and cooperative.   Allergies Allergies  Allergen Reactions  . Penicillins Rash    rash    Level of Care/Admitting Diagnosis ED Disposition    ED Disposition Condition [facility] Area: [place] [[id number]]  Level of Care: Med-Surg [16]  Covid Evaluation: Asymptomatic Screening Protocol (No Symptoms)  Diagnosis: Drug-induced delirium [facility]) [[id number]]  Admitting Physician: Lance Coon [[id number]]  Attending Physician: Lance Coon [phone]  Estimated length of stay: past midnight tomorrow  Certification:: I certify this patient will need inpatient services for at least 2 midnights  PT Class (Do Not Modify): Inpatient [101]  PT Acc Code (Do Not Modify): Private [1]       B Medical/Surgery History Past Medical History:  Diagnosis Date  . Abdominal pain 12/22/2012  . Abnormal EKG 12/11/2011  . Allergy   . Altered mental status 12/11/2011  . Anxiety   . Benzodiazepine dependence ([place]) 12/14/2011  . Charcot-Marie disease   . Chronic anxiety 12/14/2011  . Dehydration 12/11/2011  . Diastolic dysfunction 15/40/0867   Grade 1. Ejection fraction 65-70%.  . Drug withdrawal ([place]) 12/12/2011   Causing confusion, out at her hallucinations, etc. Resolved with Xanax was restarted.  . Hypertension   . Hypokalemia 12/11/2011  . Psychosis ([place]) 12/12/2011    Past Surgical History:  Procedure Laterality Date  . ABDOMINAL HYSTERECTOMY       A IV Location/Drains/Wounds Patient Lines/Drains/Airways Status   Active Line/Drains/Airways    Name:   Placement date:   Placement time:   Site:   Days:   Peripheral IV 09/26/18 Left Antecubital   09/26/18    1733    Antecubital   less than 1          Intake/Output Last 24 hours No intake or output data in the 24 hours ending 09/26/18 2323  Labs/Imaging Results for orders placed or performed during the hospital encounter of 09/26/18 (from the past 48 hour(s))  Comprehensive metabolic panel     Status: Abnormal   Collection Time: 09/26/18  5:18 PM  Result Value Ref Range   Sodium 140 135 - 145 mmol/L   Potassium 3.7 3.5 - 5.1 mmol/L   Chloride 103 98 - 111 mmol/L   CO2 24 22 - 32 mmol/L   Glucose, Bld 132 (H) 70 - 99 mg/dL   BUN 9 8 - 23 mg/dL   Creatinine, Ser 0.80 0.44 - 1.00 mg/dL   Calcium 9.6 8.9 - 10.3 mg/dL   Total Protein 7.2 6.5 - 8.1 g/dL   Albumin 4.1 3.5 - 5.0 g/dL   AST 25 15 - 41 U/L   ALT 13 0 - 44 U/L   Alkaline Phosphatase 82 38 - 126 U/L   Total Bilirubin 0.7 0.3 - 1.2 mg/dL   GFR calc non Af Amer >60 >60 mL/min   GFR calc Af Amer >60 >60 mL/min  Anion gap 13 5 - 15    Comment: Performed at [facility], [address] [place], [place] [address]  Troponin I (High Sensitivity)     Status: None   Collection Time: 09/26/18  5:18 PM  Result Value Ref Range   Troponin I (High Sensitivity) 9 <18 ng/L    Comment: (NOTE) Elevated high sensitivity troponin I (hsTnI) values and significant  changes across serial measurements may suggest ACS but many other  chronic and acute conditions are known to elevate hsTnI results.  Refer to the "Links" section for chest pain algorithms and additional  guidance. Performed at [facility], [address] [place], [place] [address]   Acetaminophen level     Status: Abnormal   Collection Time: 09/26/18  5:18 PM   Result Value Ref Range   Acetaminophen (Tylenol), Serum <10 (L) 10 - 30 ug/mL    Comment: (NOTE) Therapeutic concentrations vary significantly. A range of 10-30 ug/mL  may be an effective concentration for many patients. However, some  are best treated at concentrations outside of this range. Acetaminophen concentrations >150 ug/mL at 4 hours after ingestion  and >50 ug/mL at 12 hours after ingestion are often associated with  toxic reactions. Performed at [facility], [address] [place], [place] [address]   Salicylate level     Status: None   Collection Time: 09/26/18  5:18 PM  Result Value Ref Range   Salicylate Lvl <7.0 2.8 - 30.0 mg/dL    Comment: Performed at [facility], [address] [place], [place] [address]  Ethanol     Status: None   Collection Time: 09/26/18  5:18 PM  Result Value Ref Range   Alcohol, Ethyl (B) <10 <10 mg/dL    Comment: (NOTE) Lowest detectable limit for serum alcohol is 10 mg/dL. For medical purposes only. Performed at [facility], [address] [place], [place] [address]   CBC with Differential/Platelet     Status: Abnormal   Collection Time: 09/26/18  5:42 PM  Result Value Ref Range   WBC 10.4 4.0 - 10.5 K/uL   RBC 4.01 3.87 - 5.11 MIL/uL   Hemoglobin 12.5 12.0 - 15.0 g/dL   HCT 13.038.6 86.536.0 - 78.446.0 %   MCV 96.3 80.0 - 100.0 fL   MCH 31.2 26.0 - 34.0 pg   MCHC 32.4 30.0 - 36.0 g/dL   RDW 69.614.1 29.511.5 - 28.415.5 %   Platelets 228 150 - 400 K/uL   nRBC 0.0 0.0 - 0.2 %   Neutrophils Relative % 82 %   Neutro Abs 8.6 (H) 1.7 - 7.7 K/uL   Lymphocytes Relative 12 %   Lymphs Abs 1.2 0.7 - 4.0 K/uL   Monocytes Relative 5 %   Monocytes Absolute 0.5 0.1 - 1.0 K/uL   Eosinophils Relative 0 %   Eosinophils Absolute 0.0 0.0 - 0.5 K/uL   Basophils Relative 0 %   Basophils Absolute 0.0 0.0 - 0.1 K/uL   Immature Granulocytes 1 %   Abs Immature Granulocytes 0.06 0.00 - 0.07 K/uL    Comment: Performed at [facility], [address] [place], [place] [address]  Urinalysis, Complete w Microscopic     Status: Abnormal   Collection Time: 09/26/18  6:44 PM  Result Value Ref Range   Color, Urine STRAW (A) YELLOW   APPearance CLEAR (A) CLEAR   Specific Gravity, Urine 1.006 1.005 - 1.030   pH 8.0 5.0 - 8.0  Glucose, UA NEGATIVE NEGATIVE mg/dL   Hgb urine dipstick MODERATE (A) NEGATIVE   Bilirubin Urine NEGATIVE NEGATIVE   Ketones, ur 20 (A) NEGATIVE mg/dL   Protein, ur NEGATIVE NEGATIVE mg/dL   Nitrite NEGATIVE NEGATIVE   Leukocytes,Ua NEGATIVE NEGATIVE   RBC / HPF 0-5 0 - 5 RBC/hpf   WBC, UA 0-5 0 - 5 WBC/hpf   Bacteria, UA NONE SEEN NONE SEEN   Squamous Epithelial / LPF 0-5 0 - 5    Comment: Performed at [facility], [address] [place], [place] [address]  Urine Drug Screen, Qualitative ([facility] only)     Status: Abnormal   Collection Time: 09/26/18  6:44 PM  Result Value Ref Range   Tricyclic, Ur Screen NONE DETECTED NONE DETECTED   Amphetamines, Ur Screen NONE DETECTED NONE DETECTED   MDMA (Ecstasy)Ur Screen NONE DETECTED NONE DETECTED   Cocaine Metabolite,Ur [place] NONE DETECTED NONE DETECTED   Opiate, Ur Screen NONE DETECTED NONE DETECTED   Phencyclidine (PCP) Ur S NONE DETECTED NONE DETECTED   Cannabinoid 50 Ng, Ur [place] NONE DETECTED NONE DETECTED   Barbiturates, Ur Screen NONE DETECTED NONE DETECTED   Benzodiazepine, Ur Scrn POSITIVE (A) NONE DETECTED   Methadone Scn, Ur NONE DETECTED NONE DETECTED    Comment: (NOTE) Tricyclics + metabolites, urine    Cutoff 1000 ng/mL Amphetamines + metabolites, urine  Cutoff 1000 ng/mL MDMA (Ecstasy), urine              Cutoff 500 ng/mL Cocaine Metabolite, urine          Cutoff 300 ng/mL Opiate + metabolites, urine        Cutoff 300 ng/mL Phencyclidine (PCP), urine         Cutoff 25 ng/mL Cannabinoid, urine                 Cutoff 50 ng/mL Barbiturates + metabolites, urine  Cutoff 200 ng/mL Benzodiazepine, urine              Cutoff  200 ng/mL Methadone, urine                   Cutoff 300 ng/mL The urine drug screen provides only a preliminary, unconfirmed analytical test result and should not be used for non-medical purposes. Clinical consideration and professional judgment should be applied to any positive drug screen result due to possible interfering substances. A more specific alternate chemical method must be used in order to obtain a confirmed analytical result. Gas chromatography / mass spectrometry (GC/MS) is the preferred confirmat ory method. Performed at [facility], [address] [place], [place] [address]    Dg Chest 1 View  Result Date: 09/26/2018 CLINICAL DATA:  Altered mental status EXAM: CHEST  1 VIEW COMPARISON:  12/11/2011 FINDINGS: Cardiomegaly, vascular congestion. Right base atelectasis. No overt edema, effusions or acute bony abnormality. IMPRESSION: Cardiomegaly with vascular congestion. Right base atelectasis. Electronically Signed   By: Charlett NoseKevin  Dover M.D.   On: 09/26/2018 17:40   Ct Head Wo Contrast  Result Date: 09/26/2018 CLINICAL DATA:  Altered level of consciousness EXAM: CT HEAD WITHOUT CONTRAST TECHNIQUE: Contiguous axial images were obtained from the base of the skull through the vertex without intravenous contrast. COMPARISON:  04/28/2018 FINDINGS: Brain: Old bilateral basal ganglia lacunar infarcts. There is atrophy and chronic small vessel disease changes. 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 No acute intracranial abnormality. Specifically, no hemorrhage, hydrocephalus, mass lesion, acute infarction, or significant intracranial injury. Vascular: No hyperdense vessel  or unexpected calcification. Skull: No acute calvarial abnormality. Sinuses/Orbits: Visualized paranasal sinuses and mastoids clear. Orbital soft tissues unremarkable. Other: None IMPRESSION: Old infarcts as above. Atrophy, chronic microvascular disease. No acute intracranial abnormality. Electronically Signed   By:  Charlett NoseKevin  Dover M.D.   On: 09/26/2018 17:41    Pending Labs Unresulted Labs (From admission, onward)    Start     Ordered   09/26/18 1937  Novel Coronavirus, NAA (Hosp order, Send-out to [facility]; TAT 18-24 hrs  (Symptomatic/High Risk of Exposure/Tier 1 Patients Labs with Precautions)  Once,   STAT    Question Answer Comment  Is this test for diagnosis or screening Diagnosis of ill patient   Symptomatic for COVID-19 as defined by CDC Yes   Date of Symptom Onset 09/26/2018   Hospitalized for COVID-19 No   Admitted to ICU for COVID-19 No   Previously tested for COVID-19 No   Resident in a congregate (group) care setting No   Employed in healthcare setting No   Pregnant No      09/26/18 1936   09/26/18 1713  CBC with Differential  (ALOC)  Once,   STAT     09/26/18 1713   Signed and Held  HIV antibody (Routine Testing)  Once,   R     Signed and Held   Signed and Held  CBC  (enoxaparin (LOVENOX)    CrCl >/= 30 ml/min)  Once,   R    Comments: Baseline for enoxaparin therapy IF NOT ALREADY DRAWN.  Notify MD if PLT < 100 K.    Signed and Held   Signed and Held  Creatinine, serum  (enoxaparin (LOVENOX)    CrCl >/= 30 ml/min)  Once,   R    Comments: Baseline for enoxaparin therapy IF NOT ALREADY DRAWN.    Signed and Held   Signed and Held  Creatinine, serum  (enoxaparin (LOVENOX)    CrCl >/= 30 ml/min)  Weekly,   R    Comments: while on enoxaparin therapy    Signed and Held   Signed and Held  Basic metabolic panel  Tomorrow morning,   R     Signed and Held   Signed and Held  CBC  Tomorrow morning,   R     Signed and Held          Vitals/Pain Today's Vitals   09/26/18 1712 09/26/18 1725 09/26/18 1740 09/26/18 1741  BP:   (!) 162/90   Pulse:    87  Resp:   (!) 28 18  Temp:  98.4 F (36.9 C)    TempSrc:  Oral    SpO2:    98%  Weight: 63.2 kg     Height: 5\' 3"  (1.6 m)     PainSc: 0-No pain       Isolation Precautions Airborne and Contact  precautions  Medications Medications - No data to display  Mobility walks with device High fall risk   Focused Assessments n/a   R Recommendations: See Admitting Provider Note  Report given to:   Additional Notes: n/a

## 2018-09-26 NOTE — ED Triage Notes (Addendum)
Pt arrived via [facility] from [facility] with possible overdose. pt had a prescription for ativan that was filled on 09/22/18 with 90 tablets originally in the bottle, with 21 left in bottle verified with Annie Main, Therapist, sports. Pt someulent, but calm and cooperative.

## 2018-09-26 NOTE — ED Notes (Signed)
TTS consult was not completed, as patient is not medically cleared, and is scheduled to be medically admitted at this time.

## 2018-09-26 NOTE — H&P (Signed)
[facility] [place] at [facility]   PATIENT NAME: Maria PippinRebecca Wheeler    MR#:  [id number]  DATE OF BIRTH:  14-Feb-1955  DATE OF ADMISSION:  09/26/2018  PRIMARY CARE PHYSICIAN: Gracelyn NurseJohnston, John D, MD   REQUESTING/REFERRING PHYSICIAN: Mayford KnifeWilliams, MD  CHIEF COMPLAINT:   Chief Complaint  Patient presents with  . Drug Overdose    HISTORY OF PRESENT ILLNESS:  Maria PippinRebecca Wheeler  is a 63 y.o. female who presents with chief complaint as above.  Patient brought to the ED for significant confusion.  She was recently prescribed benzodiazepines, and reportedly has more than half the prescription missing over just a few days.  Strong concern for benzo overdose and drug-induced delirium.  Patient is confused and cannot contribute to the HPI.  Hospitalist were called for admission  PAST MEDICAL HISTORY:   Past Medical History:  Diagnosis Date  . Abdominal pain 12/22/2012  . Abnormal EKG 12/11/2011  . Allergy   . Altered mental status 12/11/2011  . Anxiety   . 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 Charcot-Marie disease   . Chronic anxiety 12/14/2011  . Dehydration 12/11/2011  . Diastolic dysfunction 12/14/2011   Grade 1. Ejection fraction 65-70%.  . Drug withdrawal ([facility]) 12/12/2011   Causing confusion, out at her hallucinations, etc. Resolved with Xanax was restarted.  . Hypertension   . Hypokalemia 12/11/2011  . Psychosis ([facility]) 12/12/2011     PAST SURGICAL HISTORY:   Past Surgical History:  Procedure Laterality Date  . ABDOMINAL HYSTERECTOMY       SOCIAL HISTORY:   Social History   Tobacco Use  . Smoking status: Never Smoker  . Smokeless tobacco: Never Used  Substance Use Topics  . Alcohol use: Yes    Alcohol/week: 1.0 standard drinks    Types: 1 Cans of beer per week     FAMILY HISTORY:   Family History  Problem Relation Age of Onset  . Charcot-Marie-Tooth disease Mother   . Heart disease Father   . Charcot-Marie-Tooth disease Son   .  Diabetes Neg Hx      DRUG ALLERGIES:   Allergies  Allergen Reactions  . Penicillins Rash    rash    MEDICATIONS AT HOME:   Prior to Admission medications   Medication Sig Start Date End Date Taking? Authorizing Provider  amLODipine (NORVASC) 5 MG tablet Take by mouth. 07/26/17 07/26/18  [provider]  aspirin EC 81 MG tablet Take 81 mg by mouth daily.    [provider]  atenolol (TENORMIN) 100 MG tablet Take by mouth daily.  03/18/16   [provider]  Calcium Carb-Cholecalciferol (CALCIUM PLUS D3 ABSORBABLE) [phone] MG-UNIT CAPS Take 1 capsule by mouth daily with breakfast. 09/04/18 09/04/19  Delano MetzNaveira, Francisco, MD  gabapentin (NEURONTIN) 100 MG capsule Take 2 capsules (200 mg total) by mouth 2 (two) times daily AND 3 capsules (300 mg total) at bedtime. 09/09/18 12/08/18  Delano MetzNaveira, Francisco, MD  ibuprofen (ADVIL,MOTRIN) 200 MG tablet Take 200 mg by mouth every 6 (six) hours as needed.    [provider]  lisinopril (PRINIVIL,ZESTRIL) 10 MG tablet Take 10 mg by mouth.  11/23/16   [provider]  oxyCODONE (OXY IR/ROXICODONE) 5 MG immediate release tablet Take 1 tablet (5 mg total) by mouth every 6 (six) hours as needed for severe pain. Must last 30 days 09/09/18 10/09/18  Delano MetzNaveira, Francisco, MD  oxyCODONE (OXY IR/ROXICODONE) 5 MG immediate release tablet Take 1 tablet (5 mg total) by mouth every 6 (  six) hours as needed for severe pain. Must last 30 days 10/09/18 11/08/18  Delano Metz, MD  oxyCODONE (OXY IR/ROXICODONE) 5 MG immediate release tablet Take 1 tablet (5 mg total) by mouth every 6 (six) hours as needed for severe pain. Must last 30 days 11/08/18 12/08/18  Delano Metz, MD    REVIEW OF SYSTEMS:  Review of Systems  Unable to perform ROS: Acuity of condition     VITAL SIGNS:   Vitals:   09/26/18 1712 09/26/18 1725 09/26/18 1740 09/26/18 1741  BP:   (!) 162/90   Pulse:    87  Resp:   (!) 28 18  Temp:  98.4 F (36.9 C)     TempSrc:  Oral    SpO2:    98%  Weight: 63.2 kg     Height: 5\' 3"  (1.6 m)      Wt Readings from Last 3 Encounters:  09/26/18 63.2 kg  03/30/18 59 kg  03/30/18 59 kg    PHYSICAL EXAMINATION:  Physical Exam  Vitals reviewed. Constitutional: She appears well-developed and well-nourished. No distress.  HENT:  Head: Normocephalic and atraumatic.  Mouth/Throat: Oropharynx is clear and moist.  Eyes: Pupils are equal, round, and reactive to light. Conjunctivae and EOM are normal. No scleral icterus.  Neck: Normal range of motion. Neck supple. No JVD present. No thyromegaly present.  Cardiovascular: Normal rate, regular rhythm and intact distal pulses. Exam reveals no gallop and no friction rub.  No murmur heard. Respiratory: Effort normal and breath sounds normal. No respiratory distress. She has no wheezes. She has no rales.  GI: Soft. Bowel sounds are normal. She exhibits no distension. There is no abdominal tenderness.  Musculoskeletal: Normal range of motion.        General: No edema.     Comments: No arthritis, no gout  Lymphadenopathy:    She has no cervical adenopathy.  Neurological: No cranial nerve deficit.  Unable to fully assess due to patient condition  Skin: Skin is warm and dry. No rash noted. No erythema.  Psychiatric:  Unable to assess due to patient condition    LABORATORY PANEL:   CBC Recent Labs  Lab 09/26/18 1742  WBC 10.4  HGB 12.5  HCT 38.6  PLT 228   ------------------------------------------------------------------------------------------------------------------  Chemistries  Recent Labs  Lab 09/26/18 1718  NA 140  K 3.7  CL 103  CO2 24  GLUCOSE 132*  BUN 9  CREATININE 0.80  CALCIUM 9.6  AST 25  ALT 13  ALKPHOS 82  BILITOT 0.7   ------------------------------------------------------------------------------------------------------------------  Cardiac Enzymes No results for input(s): TROPONINI in the last 168  hours. ------------------------------------------------------------------------------------------------------------------  RADIOLOGY:  Dg Chest 1 View  Result Date: 09/26/2018 CLINICAL DATA:  Altered mental status EXAM: CHEST  1 VIEW COMPARISON:  12/11/2011 FINDINGS: Cardiomegaly, vascular congestion. Right base atelectasis. No overt edema, effusions or acute bony abnormality. IMPRESSION: Cardiomegaly with vascular congestion. Right base atelectasis. Electronically Signed   By: Charlett Nose M.D.   On: 09/26/2018 17:40   Ct Head Wo Contrast  Result Date: 09/26/2018 CLINICAL DATA:  Altered level of consciousness EXAM: CT HEAD WITHOUT CONTRAST TECHNIQUE: Contiguous axial images were obtained from the base of the skull through the vertex without intravenous contrast. COMPARISON:  04/28/2018 FINDINGS: Brain: Old bilateral basal ganglia lacunar infarcts. There is atrophy and chronic small vessel disease changes. 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 No acute intracranial abnormality. Specifically, no hemorrhage, hydrocephalus, mass lesion, acute infarction, or significant intracranial injury. Vascular: No hyperdense  vessel or unexpected calcification. Skull: No acute calvarial abnormality. Sinuses/Orbits: Visualized paranasal sinuses and mastoids clear. Orbital soft tissues unremarkable. Other: None IMPRESSION: Old infarcts as above. Atrophy, chronic microvascular disease. No acute intracranial abnormality. Electronically Signed   By: Rolm Baptise M.D.   On: 09/26/2018 17:41    EKG:   Orders placed or performed during the hospital encounter of 09/26/18  . ED EKG  . ED EKG  . EKG 12-Lead  . EKG 12-Lead    IMPRESSION AND PLAN:  Principal Problem:   Drug-induced delirium ([place]) -likely related to taking too many benzodiazepines.  She does have a prior history of benzo abuse.  She is relatively calm, and is protecting her airway and oxygenating well.  Monitor closely until she can naturally metabolize  medications Active Problems:   Benzodiazepine abuse [facility]) -psychiatry consult ordered by ED physician   HTN (hypertension), benign -home dose antihypertensives   Diastolic dysfunction -continue home meds   Depression with anxiety -continue home meds except for benzos   Chart review performed and case discussed with ED provider. Labs, imaging and/or ECG reviewed by provider and discussed with patient/family. Management plans discussed with the patient and/or family.  COVID-19 status: Pending  DVT PROPHYLAXIS: SubQ lovenox   GI PROPHYLAXIS:  None  ADMISSION STATUS: Inpatient     CODE STATUS: Full Code Status History    Date Active Date Inactive Code Status Order ID Comments User Context   12/31/2015 1441 01/01/2016 1910 Full Code [id number]  Lytle Butte, MD ED   12/11/2011 2235 12/14/2011 1733 Full Code [id number]  Arna Medici, RN Inpatient   Advance Care Planning Activity      TOTAL TIME TAKING CARE OF THIS PATIENT: 45 minutes.   This patient was evaluated in the context of the global COVID-19 pandemic, which necessitated consideration that the patient might be at risk for infection with the SARS-CoV-2 virus that causes COVID-19. Institutional protocols and algorithms that pertain to the evaluation of patients at risk for COVID-19 are in a state of rapid change based on information released by regulatory bodies including the CDC and federal and state organizations. These policies and algorithms were followed to the best of this provider's knowledge to date during the patient's care at this facility.  Ethlyn Daniels 09/26/2018, 10:06 PM  [facility]  Office  [phone]  CC: Primary care physician; Baxter Hire, MD  Note:  This document was prepared using Dragon voice recognition software and may include unintentional dictation errors.

## 2018-09-26 NOTE — ED Provider Notes (Signed)
[facility] Emergency Department Provider Note       Time seen: ----------------------------------------- 5:10 PM on 09/26/2018 ----------------------------------------- Level V caveat: History/ROS limited by altered mental status  I have reviewed the triage vital signs and the nursing notes.  HISTORY   Chief Complaint No chief complaint on file.   HPI Maria Wheeler is a 63 y.o. female with a history of benzodiazepine dependence, Charcot-Marie-Tooth disease, chronic anxiety, dehydration, diastolic dysfunction, withdrawal, hypertension, psychosis who presents to the ED for altered mental status and bizarre behavior.  Patient was thought to have taken too much of her benzodiazepines.  There was a bottle of Ativan recently feel that she had taken most of.  She was confused and knocking on people's doors at her facility.  She denies any complaints at this time but is confused on arrival.  Past Medical History:  Diagnosis Date  . Abdominal pain 12/22/2012  . Abnormal EKG 12/11/2011  . Allergy   . Altered mental status 12/11/2011  . Anxiety   . 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 Charcot-Marie disease   . Chronic anxiety 12/14/2011  . Dehydration 12/11/2011  . Diastolic dysfunction 12/14/2011   Grade 1. Ejection fraction 65-70%.  . Drug withdrawal (HCC) 12/12/2011   Causing confusion, out at her hallucinations, etc. Resolved with Xanax was restarted.  . Hypertension   . Hypokalemia 12/11/2011  . Psychosis (HCC) 12/12/2011    Patient Active Problem List   Diagnosis Date Noted  . Neurogenic pain 09/03/2018  . Chronic neuropathic pain 09/03/2018  . Hypertension, well controlled 03/22/2018  . Depression, prolonged 07/26/2017  . Lumbar facet osteoarthritis (Bilateral) 12/28/2016  . Bradycardia by electrocardiogram 12/14/2016  . 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 Long term prescription  opiate use 12/01/2016  . Opiate use 12/01/2016  . Chronic hand pain (Bilateral) (L>R) 12/01/2016  . Long term prescription benzodiazepine use 12/01/2016  . NSAID long-term use 12/01/2016  . Generalized muscle weakness 12/01/2016  . Vitamin D deficiency 12/01/2016  . DDD (degenerative disc disease), lumbar 11/30/2016  . Lumbar facet arthropathy (Bilateral) 11/30/2016  . Adjustment disorder with mixed anxiety and depressed mood 11/10/2016  . Chronic upper extremity pain (Secondary Area of Pain) (Bilateral) (L>R) 11/10/2016  . Disorder of skeletal system 11/09/2016  . Pharmacologic therapy 11/09/2016  . Problems influencing health status 11/09/2016  . Chronic neck pain [facility] Area of Pain) (Posterior) (Bilateral) (L>R) 11/09/2016  . DDD (degenerative disc disease), cervical 11/09/2016  . Cervical spondylosis 11/09/2016  . Chronic shoulder pain (Right) 11/09/2016  . Chronic upper extremity pain (Left) 11/09/2016  . Chronic pain syndrome 11/09/2016  . Chronic knee pain (B) (L>R) 11/09/2016  . Chronic lumbar radiculopathy (L5/S1) (Left) 11/09/2016  . Chronic lower extremity pain (Primary Area of Pain) (Bilateral) (L>R) 11/09/2016  . Cervical Grade 1 Retrolisthesis of C5 over C6 11/09/2016  . Abnormal nerve conduction studies 11/09/2016  . 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HCC) 11/09/2016  . 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 Benzodiazepine abuse (HCC) 01/02/2016  . Chronic musculoskeletal pain 01/02/2016  . H/O: stroke 01/02/2016  . Multiple falls 12/31/2015  . UTI (urinary tract infection) 12/31/2015  . Hypokalemia 12/31/2015  . SDH (subdural hematoma) (HCC) 12/31/2013  . Depression with anxiety 04/02/2013  . 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 Chronic anxiety 12/14/2011  . Diastolic dysfunction 12/14/2011  . Change in mental status 12/11/2011  . HTN (hypertension), benign 12/11/2011  . Auditory  hallucinations 12/11/2011  . History of drug dependence (HCC) 12/11/2011    Past Surgical History:  Procedure Laterality Date  . ABDOMINAL HYSTERECTOMY      Allergies Penicillins  Social History Social History   Tobacco Use  . Smoking status: Never Smoker  . Smokeless tobacco: Never Used  Substance Use Topics  . Alcohol use: Yes    Alcohol/week: 1.0 standard drinks    Types: 1 Cans of beer per week  . Drug use: No   Review of Systems Patient denies complaints but is confused  All systems negative/normal/unremarkable except as stated in the HPI  ____________________________________________   PHYSICAL EXAM:  VITAL SIGNS: ED Triage Vitals  Enc Vitals Group     BP      Pulse      Resp      Temp      Temp src      SpO2      Weight      Height      Head Circumference      Peak Flow      Pain Score      Pain Loc      Pain Edu?      Excl. in GC?     Constitutional: Alert but disoriented.  No acute distress Eyes: Conjunctivae are normal. Normal extraocular movements.  Pupils are dilated bilaterally ENT      Head: Normocephalic and atraumatic.      Nose: No congestion/rhinnorhea.      Mouth/Throat: Mucous membranes are moist.      Neck: No stridor. Cardiovascular: Normal rate, regular rhythm. No murmurs, rubs, or gallops. Respiratory: Normal respiratory effort without tachypnea nor retractions. Breath sounds are clear and equal bilaterally. No wheezes/rales/rhonchi. Gastrointestinal: Soft and nontender. Normal bowel sounds Musculoskeletal: Nontender with normal range of motion in extremities. No lower extremity tenderness nor edema. Neurologic: Slightly slurred speech no gross focal neurologic deficits are appreciated.  Skin:  Skin is warm, dry and intact. No rash noted. Psychiatric: Flat affect, slurred speech with some confusion ____________________________________________  EKG: Interpreted by me.  Sinus rhythm rate 83 bpm, borderline repolarization  abnormality, prolonged QT  ____________________________________________  ED COURSE:  As part of my medical decision making, I reviewed the following data within the electronic MEDICAL RECORD NUMBER History obtained from family if available, nursing notes, old chart and ekg, as well as notes from prior ED visits. Patient presented for altered mental status, possible overdose, we will assess with labs and imaging as indicated at this time.   Procedures  Maria Wheeler was evaluated in Emergency Department on 09/26/2018 for the symptoms described in the history of present illness. She was evaluated in the context of the global COVID-19 pandemic, which necessitated consideration that the patient might be at risk for infection with the SARS-CoV-2 virus that causes COVID-19. Institutional protocols and algorithms that pertain to the evaluation of patients at risk for COVID-19 are in a state of rapid change based on information released by regulatory bodies including the CDC and federal and state organizations. These policies and algorithms were followed during the patient's care in the ED.  ____________________________________________   LABS (pertinent positives/negatives)  Labs Reviewed  COMPREHENSIVE METABOLIC PANEL - Abnormal; Notable for the following components:      Result Value   Glucose, Bld 132 (*)    All other components within normal limits  URINALYSIS, COMPLETE (UACMP) WITH MICROSCOPIC - Abnormal; Notable for the following components:   Color, Urine STRAW (*)  APPearance CLEAR (*)    Hgb urine dipstick MODERATE (*)    Ketones, ur 20 (*)    All other components within normal limits  ACETAMINOPHEN LEVEL - Abnormal; Notable for the following components:   Acetaminophen (Tylenol), Serum <10 (*)    All other components within normal limits  URINE DRUG SCREEN, QUALITATIVE ([facility] ONLY) - Abnormal; Notable for the following components:   Benzodiazepine, Ur Scrn POSITIVE (*)    All other  components within normal limits  CBC WITH DIFFERENTIAL/PLATELET - Abnormal; Notable for the following components:   Neutro Abs 8.6 (*)    All other components within normal limits  SALICYLATE LEVEL  ETHANOL  CBC WITH DIFFERENTIAL/PLATELET  TROPONIN I (HIGH SENSITIVITY)    RADIOLOGY Images were viewed by me  CT head, chest x-ray IMPRESSION: Old infarcts as above.  Atrophy, chronic microvascular disease.  No acute intracranial abnormality. IMPRESSION: Cardiomegaly with vascular congestion.  Right base atelectasis. ____________________________________________   DIFFERENTIAL DIAGNOSIS   Overdose, medication noncompliance, dehydration, electrolyte normality, CVA, psychosis  FINAL ASSESSMENT AND PLAN  Altered mental status, drug overdose, delirium   Plan: The patient had presented for altered mental status and confusion with concerns for overdose. Patient's labs did indicate benzodiazepine ingestion but this is not unexpected.  Labs are otherwise unremarkable. Patient's imaging not reveal any acute process.  She is still markedly confused and will likely require admission for drug-induced delirium   Laurence Aly, MD    Note: This note was generated in part or whole with voice recognition software. Voice recognition is usually quite accurate but there are transcription errors that can and very often do occur. I apologize for any typographical errors that were not detected and corrected.     Earleen Newport, MD 09/26/18 [phone]

## 2018-09-26 NOTE — ED Notes (Signed)
Checked in on patient and found that she had pulled out her IV and removed her diaper. She had her legs draped over the side rail of her bed and was urinating. IV site evaluated, bleeding stopped and site dressed. Pt cleaned and placed in diaper. Linens replaced. Discussed situation with EDP and pt placed in mittens. Pt resting in bed and sitter at bedside.

## 2018-09-27 DIAGNOSIS — F19921 Other psychoactive substance use, unspecified with intoxication with delirium: Secondary | ICD-10-CM

## 2018-09-27 LAB — BASIC METABOLIC PANEL
Anion gap: 12 (ref 5–15)
BUN: 8 mg/dL (ref 8–23)
CO2: 25 mmol/L (ref 22–32)
Calcium: 9.5 mg/dL (ref 8.9–10.3)
Chloride: 103 mmol/L (ref 98–111)
Creatinine, Ser: 0.73 mg/dL (ref 0.44–1.00)
GFR calc Af Amer: >60 mL/min (ref >60)
GFR calc non Af Amer: >60 mL/min (ref >60)
Glucose, Bld: 112 mg/dL — ABNORMAL HIGH (ref 70–99)
Potassium: 3.1 mmol/L — ABNORMAL LOW (ref 3.5–5.1)
Sodium: 140 mmol/L (ref 135–145)

## 2018-09-27 LAB — CBC
HCT: 39 % (ref 36.0–46.0)
Hemoglobin: 12.9 g/dL (ref 12.0–15.0)
MCH: 31.1 pg (ref 26.0–34.0)
MCHC: 33.1 g/dL (ref 30.0–36.0)
MCV: 94 fL (ref 80.0–100.0)
Platelets: 253 10*3/uL (ref 150–400)
RBC: 4.15 MIL/uL (ref 3.87–5.11)
RDW: 14.2 % (ref 11.5–15.5)
WBC: 12.1 10*3/uL — ABNORMAL HIGH (ref 4.0–10.5)
nRBC: 0 % (ref 0.0–0.2)

## 2018-09-27 MED ORDER — AMLODIPINE BESYLATE 5 MG PO TABS
5.0000 mg | ORAL_TABLET | Freq: Every day | ORAL | Status: DC
  Administered 2018-09-27: 11:00:00 5 mg via ORAL
  Filled 2018-09-27: qty 1

## 2018-09-27 MED ORDER — ACETAMINOPHEN 325 MG PO TABS
650.0000 mg | ORAL_TABLET | Freq: Four times a day (QID) | ORAL | Status: DC | PRN
  Administered 2018-09-27 (×2): 650 mg via ORAL
  Filled 2018-09-27 (×2): qty 2

## 2018-09-27 MED ORDER — ONDANSETRON HCL 4 MG/2ML IJ SOLN
4.0000 mg | Freq: Four times a day (QID) | INTRAMUSCULAR | Status: DC | PRN
  Administered 2018-09-28: 4 mg via INTRAVENOUS
  Filled 2018-09-27: qty 2

## 2018-09-27 MED ORDER — ENOXAPARIN SODIUM 40 MG/0.4ML ~~LOC~~ SOLN
40.0000 mg | SUBCUTANEOUS | Status: DC
  Administered 2018-09-27 – 2018-09-28 (×3): 40 mg via SUBCUTANEOUS
  Filled 2018-09-27 (×3): qty 0.4

## 2018-09-27 MED ORDER — ONDANSETRON HCL 4 MG PO TABS: 4.0000 mg | ORAL_TABLET | Freq: Four times a day (QID) | ORAL | Status: DC | PRN

## 2018-09-27 MED ORDER — LISINOPRIL 10 MG PO TABS
10.0000 mg | ORAL_TABLET | Freq: Every day | ORAL | Status: DC
  Administered 2018-09-27 – 2018-09-28 (×2): 10 mg via ORAL
  Filled 2018-09-27 (×2): qty 1

## 2018-09-27 MED ORDER — ASPIRIN EC 81 MG PO TBEC
81.0000 mg | DELAYED_RELEASE_TABLET | Freq: Every day | ORAL | Status: DC
  Administered 2018-09-27 – 2018-09-28 (×2): 81 mg via ORAL
  Filled 2018-09-27 (×2): qty 1

## 2018-09-27 MED ORDER — ACETAMINOPHEN 650 MG RE SUPP: 650.0000 mg | Freq: Four times a day (QID) | RECTAL | Status: DC | PRN

## 2018-09-27 MED ORDER — ATENOLOL 100 MG PO TABS
100.0000 mg | ORAL_TABLET | Freq: Every day | ORAL | Status: DC
  Administered 2018-09-27 – 2018-09-28 (×2): 100 mg via ORAL
  Filled 2018-09-27 (×4): qty 1

## 2018-09-27 NOTE — Progress Notes (Signed)
Psych NP is recommending inpatient geri-psych for patient. Clinical Education officer, museum (CSW) faxed referral to the following facilities.   [place]: Per staff member their geri-psych inpatient unit has been closed for 2 years now. [facility]: [place]: [facility]:  [facility]:  [facility]:  [place]:  [facility]:  [facility]: [place] Adelheid Hoggard, [place] [phone]

## 2018-09-27 NOTE — Plan of Care (Signed)
  Problem: Health Behavior/Discharge Planning: Goal: Ability to manage health-related needs will improve Outcome: Progressing   Problem: Clinical Measurements: Goal: Ability to maintain clinical measurements within normal limits will improve Outcome: Progressing Goal: Will remain free from infection Outcome: Progressing   Problem: Clinical Measurements: Goal: Will remain free from infection Outcome: Progressing   Problem: Activity: Goal: Risk for activity intolerance will decrease Outcome: Progressing   Problem: Safety: Goal: Ability to remain free from injury will improve Outcome: Progressing

## 2018-09-27 NOTE — Consult Note (Signed)
[facility] Face-to-Face Psychiatry Consult   Reason for Consult:  Overdose Referring Physician:  EDP Patient Identification: Maria Wheeler MRN:  [id number] Principal Diagnosis: Drug-induced delirium ([place]) Diagnosis:  Principal Problem:   Drug-induced delirium ([place]) Active Problems:   HTN (hypertension), benign   Diastolic dysfunction   Depression with anxiety   Benzodiazepine abuse ([place])   Total Time spent with patient: 30 minutes  Subjective:   Maria Wheeler is a 63 y.o. female patient reports that she is doing okay today.  Patient reports that she does not really remember what happened yesterday for her to be brought to the hospital.  She also states she does not remember who brought her to the hospital.  She is asked if she has any family nearby and she states that she has a son and sister but she does not know their numbers and she does not want them to be contacted.  She is asked about her Ativan and she states that she is prescribed it and she states that she does not overuse it and only took it as prescribed.  Patient was informed about using approximately 70 tabs of her Ativan in 5 days and the patient stated that she did not do that.  She reports that she lives alone and the last time someone was at her house was about a week ago.  She then states that she does not want her family contacted and that it could have been someone stealing her medications.  Patient is oriented to place and time and self.  However patient could not tell me any president prior to [employer].  The patient denies having any psychiatric history and denies any previous psychiatric hospitalizations and denies any previous suicide attempts.  She reports that her primary care doctor prescribes her Ativan because she has "bad nerves."  Due to patient's reported history reviewed the chart and found son's phone number under the face sheet.  Patient's son is Mannie Stabile and he can be contacted at [phone].   Patient's son reports that the patient does have a long history of psychiatric issues.  He reports that there have been no incidences in several years, approximately 63.  He states that she has had numerous overdose attempts of locking herself in the bathroom and taking a bunch of Xanax.  He states that she has had multiple admissions to [facility].  He states that he thought she was doing well here recently but that he would not put a pastor to overdose on some pills again.  He also reports that the only trigger he can think of was that she had a female that was coming and staying with her and helping take care of her and that woman died in Jun 15, 2018 and now his mother lives at home alone.  He states that he has tried to talk her into going to assisted living but she is refused he states that she does have a lot of falls and has been unstable especially when bathing and there have been multiple EMS visits to her house because of her falls.  He states that she has also been found wandering around in cold weather with minimal clothing on as well as being barefoot. He also reports that the patient has slurred speech and it worsens when overusing her medications. He states that she also has had some TIAs.  HPI:  Per Dr. Jimmye Norman: 63 y.o. female with a history of benzodiazepine dependence, Charcot-Marie-Tooth disease, chronic anxiety, dehydration, diastolic dysfunction,  withdrawal, hypertension, psychosis who presents to the ED for altered mental status and bizarre behavior.  Patient was thought to have taken too much of her benzodiazepines.  There was a bottle of Ativan recently feel that she had taken most of.  She was confused and knocking on people's doors at her facility.  She denies any complaints at this time but is confused on arrival.  Patient has been seen by this provider face-to-face.  Patient is sitting in her bed and is slow to respond and her speech is slurred.  Unsure if this is due to TIAs or due to  the amount of Ativan that she is taking.  Patient seems to be minimizing her overdose.  She is already lied about having a psychiatric history or being hospitalized or having any previous overdose attempts.  She also did not want her family contacted, but due to safety risk chart review presented to [place] with her son's phone number as next of kin.  Feel that at this time the patient is a safety risk and should be admitted to an inpatient psychiatric unit.  Based on patient's presentation and report from the patient's son feel that the patient needs to have a gero-psych placement at this time.  Past Psychiatric History: depression, suicide attempts (multiple), multiple previous hospitalizations  Risk to Self:   Risk to Others:   Prior Inpatient Therapy:   Prior Outpatient Therapy:    Past Medical History:  Past Medical History:  Diagnosis Date  . Abdominal pain 12/22/2012  . Abnormal EKG 12/11/2011  . Allergy   . Altered mental status 12/11/2011  . Anxiety   . 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 Charcot-Marie disease   . Chronic anxiety 12/14/2011  . Dehydration 12/11/2011  . Diastolic dysfunction 12/14/2011   Grade 1. Ejection fraction 65-70%.  . Drug withdrawal (HCC) 12/12/2011   Causing confusion, out at her hallucinations, etc. Resolved with Xanax was restarted.  . Hypertension   . Hypokalemia 12/11/2011  . Psychosis (HCC) 12/12/2011    Past Surgical History:  Procedure Laterality Date  . ABDOMINAL HYSTERECTOMY     Family History:  Family History  Problem Relation Age of Onset  . Charcot-Marie-Tooth disease Mother   . Heart disease Father   . Charcot-Marie-Tooth disease Son   . Diabetes Neg Hx    Family Psychiatric  History: None reported Social History:  Social History   Substance and Sexual Activity  Alcohol Use Yes  . Alcohol/week: 1.0 standard drinks  . Types: 1 Cans of beer per week     Social History   Substance and Sexual Activity  Drug Use No     Social History   Socioeconomic History  . Marital status: Divorced    Spouse name: Not on file  . Number of children: Not on file  . Years of education: Not on file  . Highest education level: Not on file  Occupational History  . Not on file  Social Needs  . Financial resource strain: Not on file  . Food insecurity    Worry: Not on file    Inability: Not on file  . Transportation needs    Medical: Not on file    Non-medical: Not on file  Tobacco Use  . Smoking status: Never Smoker  . Smokeless tobacco: Never Used  Substance and Sexual Activity  . Alcohol use: Yes    Alcohol/week: 1.0 standard drinks    Types: 1 Cans of beer per week  . Drug use:  No  . Sexual activity: Never  Lifestyle  . Physical activity    Days per week: Not on file    Minutes per session: Not on file  . Stress: Not on file  Relationships  . Social Musician on phone: Not on file    Gets together: Not on file    Attends religious service: Not on file    Active member of club or organization: Not on file    Attends meetings of clubs or organizations: Not on file    Relationship status: Not on file  Other Topics Concern  . Not on file  Social History Narrative  . Not on file   Additional Social History:    Allergies:   Allergies  Allergen Reactions  . Penicillins Rash    rash    Labs:  Results for orders placed or performed during the hospital encounter of 09/26/18 (from the past 48 hour(s))  Comprehensive metabolic panel     Status: Abnormal   Collection Time: 09/26/18  5:18 PM  Result Value Ref Range   Sodium 140 135 - 145 mmol/L   Potassium 3.7 3.5 - 5.1 mmol/L   Chloride 103 98 - 111 mmol/L   CO2 24 22 - 32 mmol/L   Glucose, Bld 132 (H) 70 - 99 mg/dL   BUN 9 8 - 23 mg/dL   Creatinine, Ser 4.09 0.44 - 1.00 mg/dL   Calcium 9.6 8.9 - 81.1 mg/dL   Total Protein 7.2 6.5 - 8.1 g/dL   Albumin 4.1 3.5 - 5.0 g/dL   AST 25 15 - 41 U/L   ALT 13 0 - 44 U/L   Alkaline  Phosphatase 82 38 - 126 U/L   Total Bilirubin 0.7 0.3 - 1.2 mg/dL   GFR calc non Af Amer >60 >60 mL/min   GFR calc Af Amer >60 >60 mL/min   Anion gap 13 5 - 15    Comment: Performed at [facility], [address] [place], [place] [address]  Troponin I (High Sensitivity)     Status: None   Collection Time: 09/26/18  5:18 PM  Result Value Ref Range   Troponin I (High Sensitivity) 9 <18 ng/L    Comment: (NOTE) Elevated high sensitivity troponin I (hsTnI) values and significant  changes across serial measurements may suggest ACS but many other  chronic and acute conditions are known to elevate hsTnI results.  Refer to the "Links" section for chest pain algorithms and additional  guidance. Performed at [facility], [address] [place], [place] [address]   Acetaminophen level     Status: Abnormal   Collection Time: 09/26/18  5:18 PM  Result Value Ref Range   Acetaminophen (Tylenol), Serum <10 (L) 10 - 30 ug/mL    Comment: (NOTE) Therapeutic concentrations vary significantly. A range of 10-30 ug/mL  may be an effective concentration for many patients. However, some  are best treated at concentrations outside of this range. Acetaminophen concentrations >150 ug/mL at 4 hours after ingestion  and >50 ug/mL at 12 hours after ingestion are often associated with  toxic reactions. Performed at [facility], [address] [place], [place] [address]   Salicylate level     Status: None   Collection Time: 09/26/18  5:18 PM  Result Value Ref Range   Salicylate Lvl <7.0 2.8 - 30.0 mg/dL    Comment: Performed at [facility], [address] [place], [place] [address]  Ethanol  Status: None   Collection Time: 09/26/18  5:18 PM  Result Value Ref Range   Alcohol, Ethyl (B) <10 <10 mg/dL    Comment: (NOTE) Lowest detectable limit for serum alcohol is 10 mg/dL. For medical purposes only. Performed at [facility], [address]  [place], [place] [address]   CBC with Differential/Platelet     Status: Abnormal   Collection Time: 09/26/18  5:42 PM  Result Value Ref Range   WBC 10.4 4.0 - 10.5 K/uL   RBC 4.01 3.87 - 5.11 MIL/uL   Hemoglobin 12.5 12.0 - 15.0 g/dL   HCT 60.438.6 54.036.0 - 98.146.0 %   MCV 96.3 80.0 - 100.0 fL   MCH 31.2 26.0 - 34.0 pg   MCHC 32.4 30.0 - 36.0 g/dL   RDW 19.114.1 47.811.5 - 29.515.5 %   Platelets 228 150 - 400 K/uL   nRBC 0.0 0.0 - 0.2 %   Neutrophils Relative % 82 %   Neutro Abs 8.6 (H) 1.7 - 7.7 K/uL   Lymphocytes Relative 12 %   Lymphs Abs 1.2 0.7 - 4.0 K/uL   Monocytes Relative 5 %   Monocytes Absolute 0.5 0.1 - 1.0 K/uL   Eosinophils Relative 0 %   Eosinophils Absolute 0.0 0.0 - 0.5 K/uL   Basophils Relative 0 %   Basophils Absolute 0.0 0.0 - 0.1 K/uL   Immature Granulocytes 1 %   Abs Immature Granulocytes 0.06 0.00 - 0.07 K/uL    Comment: Performed at [facility], [address] [place], [place] [address]  Urinalysis, Complete w Microscopic     Status: Abnormal   Collection Time: 09/26/18  6:44 PM  Result Value Ref Range   Color, Urine STRAW (A) YELLOW   APPearance CLEAR (A) CLEAR   Specific Gravity, Urine 1.006 1.005 - 1.030   pH 8.0 5.0 - 8.0   Glucose, UA NEGATIVE NEGATIVE mg/dL   Hgb urine dipstick MODERATE (A) NEGATIVE   Bilirubin Urine NEGATIVE NEGATIVE   Ketones, ur 20 (A) NEGATIVE mg/dL   Protein, ur NEGATIVE NEGATIVE mg/dL   Nitrite NEGATIVE NEGATIVE   Leukocytes,Ua NEGATIVE NEGATIVE   RBC / HPF 0-5 0 - 5 RBC/hpf   WBC, UA 0-5 0 - 5 WBC/hpf   Bacteria, UA NONE SEEN NONE SEEN   Squamous Epithelial / LPF 0-5 0 - 5    Comment: Performed at [facility], [address] [place], [place] [address]  Urine Drug Screen, Qualitative ([facility] only)     Status: Abnormal   Collection Time: 09/26/18  6:44 PM  Result Value Ref Range   Tricyclic, Ur Screen NONE DETECTED NONE DETECTED   Amphetamines, Ur Screen NONE DETECTED NONE DETECTED   MDMA (Ecstasy)Ur Screen NONE  DETECTED NONE DETECTED   Cocaine Metabolite,Ur [place] NONE DETECTED NONE DETECTED   Opiate, Ur Screen NONE DETECTED NONE DETECTED   Phencyclidine (PCP) Ur S NONE DETECTED NONE DETECTED   Cannabinoid 50 Ng, Ur [place] NONE DETECTED NONE DETECTED   Barbiturates, Ur Screen NONE DETECTED NONE DETECTED   Benzodiazepine, Ur Scrn POSITIVE (A) NONE DETECTED   Methadone Scn, Ur NONE DETECTED NONE DETECTED    Comment: (NOTE) Tricyclics + metabolites, urine    Cutoff 1000 ng/mL Amphetamines + metabolites, urine  Cutoff 1000 ng/mL MDMA (Ecstasy), urine              Cutoff 500 ng/mL Cocaine Metabolite, urine          Cutoff 300 ng/mL Opiate + metabolites, urine  Cutoff 300 ng/mL Phencyclidine (PCP), urine         Cutoff 25 ng/mL Cannabinoid, urine                 Cutoff 50 ng/mL Barbiturates + metabolites, urine  Cutoff 200 ng/mL Benzodiazepine, urine              Cutoff 200 ng/mL Methadone, urine                   Cutoff 300 ng/mL The urine drug screen provides only a preliminary, unconfirmed analytical test result and should not be used for non-medical purposes. Clinical consideration and professional judgment should be applied to any positive drug screen result due to possible interfering substances. A more specific alternate chemical method must be used in order to obtain a confirmed analytical result. Gas chromatography / mass spectrometry (GC/MS) is the preferred confirmat ory method. Performed at [facility], [address] [place], [place] [address]   Basic metabolic panel     Status: Abnormal   Collection Time: 09/27/18  6:23 AM  Result Value Ref Range   Sodium 140 135 - 145 mmol/L   Potassium 3.1 (L) 3.5 - 5.1 mmol/L   Chloride 103 98 - 111 mmol/L   CO2 25 22 - 32 mmol/L   Glucose, Bld 112 (H) 70 - 99 mg/dL   BUN 8 8 - 23 mg/dL   Creatinine, Ser 6.71 0.44 - 1.00 mg/dL   Calcium 9.5 8.9 - 24.5 mg/dL   GFR calc non Af Amer >60 >60 mL/min   GFR calc Af Amer >60 >60  mL/min   Anion gap 12 5 - 15    Comment: Performed at [facility], [address] [place], [place] [address]  CBC     Status: Abnormal   Collection Time: 09/27/18  6:23 AM  Result Value Ref Range   WBC 12.1 (H) 4.0 - 10.5 K/uL   RBC 4.15 3.87 - 5.11 MIL/uL   Hemoglobin 12.9 12.0 - 15.0 g/dL   HCT 33.8 25.0 - 53.9 %   MCV 94.0 80.0 - 100.0 fL   MCH 31.1 26.0 - 34.0 pg   MCHC 33.1 30.0 - 36.0 g/dL   RDW 76.7 34.1 - 93.7 %   Platelets 253 150 - 400 K/uL   nRBC 0.0 0.0 - 0.2 %    Comment: Performed at [facility], [address] [place], [place] [address]    Current Facility-Administered Medications  Medication Dose Route Frequency Provider Last Rate Last Dose  . acetaminophen (TYLENOL) tablet 650 mg  650 mg Oral Q6H PRN Oralia Manis, MD   650 mg at 09/27/18 1416   Or  . acetaminophen (TYLENOL) suppository 650 mg  650 mg Rectal Q6H PRN Oralia Manis, MD      . amLODipine (NORVASC) tablet 5 mg  5 mg Oral Daily Oralia Manis, MD   5 mg at 09/27/18 1034  . aspirin EC tablet 81 mg  81 mg Oral Daily Oralia Manis, MD   81 mg at 09/27/18 1034  . atenolol (TENORMIN) tablet 100 mg  100 mg Oral Daily Shaune Pollack, MD      . enoxaparin (LOVENOX) injection 40 mg  40 mg Subcutaneous Q24H Oralia Manis, MD   40 mg at 09/27/18 0200  . lisinopril (ZESTRIL) tablet 10 mg  10 mg Oral Daily Shaune Pollack, MD      . ondansetron [facility]) tablet 4 mg  4 mg Oral Q6H PRN  Oralia ManisWillis, David, MD       Or  . ondansetron [facility](ZOFRAN) injection 4 mg  4 mg Intravenous Q6H PRN Oralia ManisWillis, David, MD        Musculoskeletal: Strength & Muscle Tone: decreased Gait & Station: Remained in bed during evaluation Patient leans: N/A  Psychiatric Specialty Exam: Physical Exam  Nursing note and vitals reviewed. Constitutional: She is oriented to person, place, and time. She appears well-developed and well-nourished.  Cardiovascular: Normal rate.  Respiratory: Effort normal.  Musculoskeletal: Normal range of  motion.  Neurological: She is alert and oriented to person, place, and time.    Review of Systems  Constitutional: Negative.   HENT: Negative.   Eyes: Negative.   Respiratory: Negative.   Cardiovascular: Negative.   Gastrointestinal: Negative.   Genitourinary: Negative.   Musculoskeletal: Negative.   Skin: Negative.   Neurological: Negative.   Endo/Heme/Allergies: Negative.   Psychiatric/Behavioral: The patient is nervous/anxious.     Blood pressure (!) 166/92, pulse 78, temperature 98.8 F (37.1 C), temperature source Oral, resp. rate 18, height 5\' 3"  (1.6 m), weight 63.2 kg, SpO2 100 %.Body mass index is 24.68 kg/m.  General Appearance: Disheveled  Eye Contact:  Fair  Speech:  Slurred and undersdtandable  Volume:  Decreased  Mood:  Anxious  Affect:  Flat  Thought Process:  Linear and Descriptions of Associations: Intact  Orientation:  Full (Time, Place, and Person)  Thought Content:  WDL  Suicidal Thoughts:  No  Homicidal Thoughts:  No  Memory:  Immediate;   Good Recent;   Poor Remote;   Poor  Judgement:  Impaired  Insight:  Fair  Psychomotor Activity:  Decreased  Concentration:  Concentration: Fair  Recall:  Good  Fund of Knowledge:  Fair  Language:  Fair  Akathisia:  No  Handed:    AIMS (if indicated):     Assets:  Financial Resources/Insurance Housing Resilience Social Support  ADL's:  Impaired  Cognition:  Impaired,  Mild  Sleep:        Treatment Plan Summary: Daily contact with patient to assess and evaluate symptoms and progress in treatment and Medication management  Monitor for benzodiazepine withdrawal with CIWA  Disposition: Recommend Gero-psychiatric Inpatient admission when medically cleared.  Gerlene Burdockravis B Money, FNP 09/27/2018 2:59 PM

## 2018-09-27 NOTE — Progress Notes (Addendum)
[facility] [place] [facility]   PATIENT NAME: Maria PippinRebecca Wheeler    MR#:  [id number]  DATE OF BIRTH:  1955-08-20  SUBJECTIVE:  CHIEF COMPLAINT:   Chief Complaint  Patient presents with   Drug Overdose   The patient is more alert and weak.  She has no complaints.  She does not know why she is in the hospital. REVIEW OF SYSTEMS:  Review of Systems  Constitutional: Negative for chills, fever and malaise/fatigue.  HENT: Negative for sore throat.   Eyes: Negative for blurred vision and double vision.  Respiratory: Negative for cough, hemoptysis, shortness of breath, wheezing and stridor.   Cardiovascular: Negative for chest pain, palpitations, orthopnea and leg swelling.  Gastrointestinal: Negative for abdominal pain, blood in stool, diarrhea, melena, nausea and vomiting.  Genitourinary: Negative for dysuria, flank pain and hematuria.  Musculoskeletal: Negative for back pain and joint pain.  Skin: Negative for rash.  Neurological: Negative for dizziness, sensory change, focal weakness, seizures, loss of consciousness, weakness and headaches.  Endo/Heme/Allergies: Negative for polydipsia.  Psychiatric/Behavioral: Negative for depression. The patient is not nervous/anxious.     DRUG ALLERGIES:   Allergies  Allergen Reactions   Penicillins Rash    rash   VITALS:  Blood pressure (!) 166/92, pulse 78, temperature 98.8 F (37.1 C), temperature source Oral, resp. rate 18, height 5\' 3"  (1.6 m), weight 63.2 kg, SpO2 100 %. PHYSICAL EXAMINATION:  Physical Exam Constitutional:      General: She is not in acute distress. HENT:     Head: Normocephalic.     Mouth/Throat:     Mouth: Mucous membranes are moist.  Eyes:     General: No scleral icterus.    Conjunctiva/sclera: Conjunctivae normal.     Pupils: Pupils are equal, round, and reactive to light.  Neck:     Musculoskeletal: Normal range of motion and neck supple.     Vascular: No JVD.     Trachea: No  tracheal deviation.  Cardiovascular:     Rate and Rhythm: Normal rate and regular rhythm.     Heart sounds: Normal heart sounds. No murmur. No gallop.   Pulmonary:     Effort: Pulmonary effort is normal. No respiratory distress.     Breath sounds: Normal breath sounds. No wheezing or rales.  Abdominal:     General: Bowel sounds are normal. There is no distension.     Palpations: Abdomen is soft.     Tenderness: There is no abdominal tenderness. There is no rebound.  Musculoskeletal: Normal range of motion.        General: No tenderness.     Right lower leg: No edema.     Left lower leg: No edema.  Skin:    Findings: No erythema or rash.  Neurological:     Mental Status: She is alert and oriented to person, place, and time.     Cranial Nerves: No cranial nerve deficit.  Psychiatric:     Comments: Looks confused.    LABORATORY PANEL:  Female CBC Recent Labs  Lab 09/27/18 0623  WBC 12.1*  HGB 12.9  HCT 39.0  PLT 253   ------------------------------------------------------------------------------------------------------------------ Chemistries  Recent Labs  Lab 09/26/18 1718 09/27/18 0623  NA 140 140  K 3.7 3.1*  CL 103 103  CO2 24 25  GLUCOSE 132* 112*  BUN 9 8  CREATININE 0.80 0.73  CALCIUM 9.6 9.5  AST 25  --   ALT 13  --   ALKPHOS  82  --   BILITOT 0.7  --    RADIOLOGY:  Dg Chest 1 View  Result Date: 09/26/2018 CLINICAL DATA:  Altered mental status EXAM: CHEST  1 VIEW COMPARISON:  12/11/2011 FINDINGS: Cardiomegaly, vascular congestion. Right base atelectasis. No overt edema, effusions or acute bony abnormality. IMPRESSION: Cardiomegaly with vascular congestion. Right base atelectasis. Electronically Signed   By: Rolm Baptise M.D.   On: 09/26/2018 17:40   Ct Head Wo Contrast  Result Date: 09/26/2018 CLINICAL DATA:  Altered level of consciousness EXAM: CT HEAD WITHOUT CONTRAST TECHNIQUE: Contiguous axial images were obtained from the base of the skull through  the vertex without intravenous contrast. COMPARISON:  04/28/2018 FINDINGS: Brain: Old bilateral basal ganglia lacunar infarcts. There is atrophy and chronic small vessel disease changes. 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 No acute intracranial abnormality. Specifically, no hemorrhage, hydrocephalus, mass lesion, acute infarction, or significant intracranial injury. Vascular: No hyperdense vessel or unexpected calcification. Skull: No acute calvarial abnormality. Sinuses/Orbits: Visualized paranasal sinuses and mastoids clear. Orbital soft tissues unremarkable. Other: None IMPRESSION: Old infarcts as above. Atrophy, chronic microvascular disease. No acute intracranial abnormality. Electronically Signed   By: Rolm Baptise M.D.   On: 09/26/2018 17:41   ASSESSMENT AND PLAN:    Drug-induced delirium ([place]) -likely related to taking too many benzodiazepines.  She does have a prior history of benzo abuse.  Supportive care, IV fluid support, one-to-one sitter.    Benzodiazepine abuse ([place]) -psychiatry consult   HTN (hypertension), benign -home dose antihypertensives   Diastolic dysfunction -continue home meds   Depression with anxiety -continue home meds except for benzos I called the patient's son, nobody answered the phone. All the records are reviewed and case discussed with Care Management/Social Worker. Management plans discussed with the patient, family and they are in agreement.  CODE STATUS: Full Code  TOTAL TIME TAKING CARE OF THIS PATIENT: 26 minutes.   More than 50% of the time was spent in counseling/coordination of care: YES  POSSIBLE D/C IN 1-2 DAYS, DEPENDING ON CLINICAL CONDITION.   Demetrios Loll M.D on 09/27/2018 at 12:19 PM  Between 7am to 6pm - Pager - [phone]  After 6pm go to [email or web address] - [facility]

## 2018-09-27 NOTE — Plan of Care (Signed)
  Problem: Health Behavior/Discharge Planning: Goal: Ability to manage health-related needs will improve Outcome: Progressing   Problem: Clinical Measurements: Goal: Ability to maintain clinical measurements within normal limits will improve Outcome: Progressing Goal: Will remain free from infection Outcome: Progressing   Problem: Activity: Goal: Risk for activity intolerance will decrease Outcome: Progressing   Problem: Safety: Goal: Ability to remain free from injury will improve Outcome: Progressing   

## 2018-09-27 NOTE — BH Assessment (Cosign Needed)
Maria Wheeler is a 63 y.o. female with a history of benzodiazepine dependence, Charcot-Marie-Tooth disease, chronic anxiety, dehydration, diastolic dysfunction, withdrawal, hypertension, psychosis who presents to the ED for altered mental status and bizarre behavior. The patient was seen and attempts made to complete her initial psychiatric assessment.  The patient was unable to participate because she continues to exhibit signs of altered mental status and bizarre behaviors.  The patient will have to be reassessed at a later time.

## 2018-09-28 LAB — HIV ANTIBODY (ROUTINE TESTING W REFLEX): HIV Screen 4th Generation wRfx: NONREACTIVE

## 2018-09-28 LAB — MAGNESIUM: Magnesium: 2 mg/dL (ref 1.7–2.4)

## 2018-09-28 LAB — SARS CORONAVIRUS 2 BY RT PCR (HOSPITAL ORDER, PERFORMED IN ~~LOC~~ HOSPITAL LAB): SARS Coronavirus 2: NEGATIVE

## 2018-09-28 MED ORDER — AMLODIPINE BESYLATE 10 MG PO TABS
10.0000 mg | ORAL_TABLET | Freq: Every day | ORAL | Status: DC
  Administered 2018-09-28: 10 mg via ORAL
  Filled 2018-09-28: qty 1

## 2018-09-28 MED ORDER — LORAZEPAM 1 MG PO TABS
1.0000 mg | ORAL_TABLET | Freq: Once | ORAL | Status: AC
  Administered 2018-09-28: 15:00:00 1 mg via ORAL
  Filled 2018-09-28: qty 1

## 2018-09-28 MED ORDER — TRAZODONE HCL 50 MG PO TABS
100.0000 mg | ORAL_TABLET | Freq: Every day | ORAL | Status: DC
  Administered 2018-09-28: 100 mg via ORAL
  Filled 2018-09-28 (×2): qty 2

## 2018-09-28 NOTE — Progress Notes (Signed)
Pt with multiple attempts to leave room stating she is leaving and going home. Pt instructed that she is unable to leave. Pt aggressive and combative with this RN and the safety sitter at bedside. Pt refusing to stay in bed and refusing to keep cardiac monitor on. Security called to bedside and MD paged. Ativan ordered and given. Pt calm at this time. Safety sitter remains at bedside and all safety measures are in place.

## 2018-09-28 NOTE — Progress Notes (Signed)
Clinical Education officer, museum (CSW) received a call from [employer] at [facility] inpatient geri-psych unit. Per Delana Meyer they can accept patient and requested that patient be under IVC and have a negative covid test. CSW faxed IVC paper work to [facility] at fax # [phone]. Negative covid test results will be faxed once available.  [facility] is going to [facility] located at [address], [place] [address] [address]. RN please call report at [phone]. Accepting physician is Dr. Dorothyann Peng. MD and RN please complete EMTALA form when patient is discharged. Per [facility] department they will transport patient tomorrow morning around 8 am. RN aware of above. Patient's son Corene Cornea is aware of above. CSW will continue to follow and assist as needed.   [employer], LCSW [phone]

## 2018-09-28 NOTE — Consult Note (Signed)
I received a message that patient had become agitated the day and had to receive a dose of Ativan.  Patient is attempting to leave the facility and she was placed under IVC.  I went and spoke with the patient and informed her about her being under IVC and that she is not allowed to leave the hospital.  Also discussed with the patient about her not informing me about her psychiatric history or her past suicide attempts with overdoses.  She immediately looks at me and states that she needs to go home to take care of her son because his leg was chopped off.  She is informed that I have spoken with her son and that he is okay with her getting the help that she needs and she requests to have his phone number because she cannot remember it.  Patient's son's phone number is provided to the patient so that she can contact him.  Per chart review [facility] and will be transferred as soon as there is a negative COVID test resulted.

## 2018-09-28 NOTE — Progress Notes (Signed)
[facility] [place] [facility]   PATIENT NAME: Maria PippinRebecca Hickox    MR#:  [id number]  DATE OF BIRTH:  23-Nov-1955  SUBJECTIVE:  CHIEF COMPLAINT:   Chief Complaint  Patient presents with  . Drug Overdose   The patient is more alert and weak.  She has no complaints.  She was telling me that she has 2 be home as her son is going to have a surgery on his foot and he has 64 year old child.  Later she was agitated that we put her on IVC.  REVIEW OF SYSTEMS:  Review of Systems  Constitutional: Negative for chills, fever and malaise/fatigue.  HENT: Negative for sore throat.   Eyes: Negative for blurred vision and double vision.  Respiratory: Negative for cough, hemoptysis, shortness of breath, wheezing and stridor.   Cardiovascular: Negative for chest pain, palpitations, orthopnea and leg swelling.  Gastrointestinal: Negative for abdominal pain, blood in stool, diarrhea, melena, nausea and vomiting.  Genitourinary: Negative for dysuria, flank pain and hematuria.  Musculoskeletal: Negative for back pain and joint pain.  Skin: Negative for rash.  Neurological: Negative for dizziness, sensory change, focal weakness, seizures, loss of consciousness, weakness and headaches.  Endo/Heme/Allergies: Negative for polydipsia.  Psychiatric/Behavioral: Negative for depression. The patient is not nervous/anxious.     DRUG ALLERGIES:   Allergies  Allergen Reactions  . Penicillins Rash    rash   VITALS:  Blood pressure (!) 154/99, pulse 81, temperature 98.9 F (37.2 C), temperature source Oral, resp. rate 15, height 5\' 3"  (1.6 m), weight 63.2 kg, SpO2 99 %. PHYSICAL EXAMINATION:  Physical Exam Constitutional:      General: She is not in acute distress. HENT:     Head: Normocephalic.     Mouth/Throat:     Mouth: Mucous membranes are moist.  Eyes:     General: No scleral icterus.    Conjunctiva/sclera: Conjunctivae normal.     Pupils: Pupils are equal, round, and  reactive to light.  Neck:     Musculoskeletal: Normal range of motion and neck supple.     Vascular: No JVD.     Trachea: No tracheal deviation.  Cardiovascular:     Rate and Rhythm: Normal rate and regular rhythm.     Heart sounds: Normal heart sounds. No murmur. No gallop.   Pulmonary:     Effort: Pulmonary effort is normal. No respiratory distress.     Breath sounds: Normal breath sounds. No wheezing or rales.  Abdominal:     General: Bowel sounds are normal. There is no distension.     Palpations: Abdomen is soft.     Tenderness: There is no abdominal tenderness. There is no rebound.  Musculoskeletal: Normal range of motion.        General: No tenderness.     Right lower leg: No edema.     Left lower leg: No edema.  Skin:    Findings: No erythema or rash.  Neurological:     Mental Status: She is alert and oriented to person, place, and time.     Cranial Nerves: No cranial nerve deficit.  Psychiatric:     Comments: Looks confused.    LABORATORY PANEL:  Female CBC Recent Labs  Lab 09/27/18 0623  WBC 12.1*  HGB 12.9  HCT 39.0  PLT 253   ------------------------------------------------------------------------------------------------------------------ Chemistries  Recent Labs  Lab 09/26/18 1718 09/27/18 0623 09/28/18 0943  NA 140 140  --   K 3.7 3.1*  --  CL 103 103  --   CO2 24 25  --   GLUCOSE 132* 112*  --   BUN 9 8  --   CREATININE 0.80 0.73  --   CALCIUM 9.6 9.5  --   MG  --   --  2.0  AST 25  --   --   ALT 13  --   --   ALKPHOS 82  --   --   BILITOT 0.7  --   --    RADIOLOGY:  No results found. ASSESSMENT AND PLAN:   *  Drug-induced delirium ([place]) -likely related to taking too many benzodiazepines.  She does have a prior history of benzo abuse.  Supportive care, IV fluid support, one-to-one sitter. Delirium improved patient is alert and oriented now. She has some intermittent episodes of agitation, I would let psychiatry suggest meds for the  controlling that.  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  Suggested inpatient psych admission.  Patient seems to be medically stable as vitals are and not requiring much medications.  *  HTN (hypertension), benign -home dose antihypertensives *  Diastolic dysfunction -continue home meds *  Depression with anxiety -continue home meds except for benzos   All the records are reviewed and case discussed with Care Management/Social Worker. Management plans discussed with the patient, family and they are in agreement.  CODE STATUS: Full Code  TOTAL TIME TAKING CARE OF THIS PATIENT: 36 minutes.   More than 50% of the time was spent in counseling/coordination of care: YES  POSSIBLE D/C IN 1-2 DAYS, DEPENDING ON CLINICAL CONDITION.  Vaughan Basta M.D on 09/28/2018 at 4:02 PM  Between 7am to 6pm - Pager - [phone]  After 6pm go to [email or web address] - [facility]

## 2018-09-29 DIAGNOSIS — L2089 Other atopic dermatitis: Secondary | ICD-10-CM | POA: Diagnosis not present

## 2018-09-29 DIAGNOSIS — F331 Major depressive disorder, recurrent, moderate: Secondary | ICD-10-CM | POA: Diagnosis not present

## 2018-09-29 DIAGNOSIS — Z131 Encounter for screening for diabetes mellitus: Secondary | ICD-10-CM | POA: Diagnosis not present

## 2018-09-29 DIAGNOSIS — Z1329 Encounter for screening for other suspected endocrine disorder: Secondary | ICD-10-CM | POA: Diagnosis not present

## 2018-09-29 DIAGNOSIS — M160 Bilateral primary osteoarthritis of hip: Secondary | ICD-10-CM | POA: Diagnosis not present

## 2018-09-29 DIAGNOSIS — M14672 Charcot's joint, left ankle and foot: Secondary | ICD-10-CM | POA: Diagnosis not present

## 2018-09-29 DIAGNOSIS — Z13228 Encounter for screening for other metabolic disorders: Secondary | ICD-10-CM | POA: Diagnosis not present

## 2018-09-29 DIAGNOSIS — I5030 Unspecified diastolic (congestive) heart failure: Secondary | ICD-10-CM | POA: Diagnosis not present

## 2018-09-29 DIAGNOSIS — Z130 Encounter for screening for diseases of the blood and blood-forming organs and certain disorders involving the immune mechanism: Secondary | ICD-10-CM | POA: Diagnosis not present

## 2018-09-29 DIAGNOSIS — M14671 Charcot's joint, right ankle and foot: Secondary | ICD-10-CM | POA: Diagnosis not present

## 2018-09-29 DIAGNOSIS — I10 Essential (primary) hypertension: Secondary | ICD-10-CM | POA: Diagnosis not present

## 2018-09-29 DIAGNOSIS — Z79899 Other long term (current) drug therapy: Secondary | ICD-10-CM | POA: Diagnosis not present

## 2018-09-29 DIAGNOSIS — F0391 Unspecified dementia with behavioral disturbance: Secondary | ICD-10-CM | POA: Diagnosis present

## 2018-09-29 DIAGNOSIS — Z13220 Encounter for screening for lipoid disorders: Secondary | ICD-10-CM | POA: Diagnosis not present

## 2018-09-29 DIAGNOSIS — D510 Vitamin B12 deficiency anemia due to intrinsic factor deficiency: Secondary | ICD-10-CM | POA: Diagnosis not present

## 2018-09-29 DIAGNOSIS — E559 Vitamin D deficiency, unspecified: Secondary | ICD-10-CM | POA: Diagnosis not present

## 2018-09-29 DIAGNOSIS — R7309 Other abnormal glucose: Secondary | ICD-10-CM | POA: Diagnosis not present

## 2018-09-29 DIAGNOSIS — E785 Hyperlipidemia, unspecified: Secondary | ICD-10-CM | POA: Diagnosis not present

## 2018-09-29 DIAGNOSIS — G894 Chronic pain syndrome: Secondary | ICD-10-CM | POA: Diagnosis not present

## 2018-09-29 DIAGNOSIS — R946 Abnormal results of thyroid function studies: Secondary | ICD-10-CM | POA: Diagnosis not present

## 2018-09-29 DIAGNOSIS — F411 Generalized anxiety disorder: Secondary | ICD-10-CM | POA: Diagnosis present

## 2018-09-29 DIAGNOSIS — Z5181 Encounter for therapeutic drug level monitoring: Secondary | ICD-10-CM | POA: Diagnosis not present

## 2018-09-29 LAB — BASIC METABOLIC PANEL
Anion gap: 11 (ref 5–15)
BUN: 11 mg/dL (ref 8–23)
CO2: 27 mmol/L (ref 22–32)
Calcium: 9.5 mg/dL (ref 8.9–10.3)
Chloride: 106 mmol/L (ref 98–111)
Creatinine, Ser: 0.74 mg/dL (ref 0.44–1.00)
GFR calc Af Amer: >60 mL/min (ref >60)
GFR calc non Af Amer: >60 mL/min (ref >60)
Glucose, Bld: 98 mg/dL (ref 70–99)
Potassium: 3.3 mmol/L — ABNORMAL LOW (ref 3.5–5.1)
Sodium: 144 mmol/L (ref 135–145)

## 2018-09-29 MED ORDER — POTASSIUM CHLORIDE CRYS ER 20 MEQ PO TBCR
40.0000 meq | EXTENDED_RELEASE_TABLET | Freq: Once | ORAL | Status: AC
  Administered 2018-09-29: 08:00:00 40 meq via ORAL
  Filled 2018-09-29: qty 2

## 2018-09-29 NOTE — Progress Notes (Signed)
Patient transferred to [facility]. In [place]. Report called to [place] at facility.

## 2018-09-29 NOTE — Discharge Summary (Signed)
[facility] [place] at [facility]   PATIENT NAME: Maria PippinRebecca Bilodeau    MR#:  [id number]  DATE OF BIRTH:  21-Feb-1955  DATE OF ADMISSION:  09/26/2018 ADMITTING PHYSICIAN: Oralia Manisavid Willis, MD  DATE OF DISCHARGE: 09/29/2018   PRIMARY CARE PHYSICIAN: Gracelyn NurseJohnston, John D, MD    ADMISSION DIAGNOSIS:  Delirium [R41.0] Drug overdose, undetermined intent, initial encounter [T50.904A]  DISCHARGE DIAGNOSIS:  Principal Problem:   Drug-induced delirium (HCC) Active Problems:   HTN (hypertension), benign   Diastolic dysfunction   Depression with anxiety   Benzodiazepine abuse (HCC)   SECONDARY DIAGNOSIS:   Past Medical History:  Diagnosis Date  . Abdominal pain 12/22/2012  . Abnormal EKG 12/11/2011  . Allergy   . Altered mental status 12/11/2011  . Anxiety   . 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 Charcot-Marie disease   . Chronic anxiety 12/14/2011  . Dehydration 12/11/2011  . Diastolic dysfunction 12/14/2011   Grade 1. Ejection fraction 65-70%.  . Drug withdrawal (HCC) 12/12/2011   Causing confusion, out at her hallucinations, etc. Resolved with Xanax was restarted.  . Hypertension   . Hypokalemia 12/11/2011  . Psychosis (HCC) 12/12/2011    HOSPITAL COURSE:   *Drug-induced delirium (HCC) -likely related to taking too many benzodiazepines. She does have a prior history of benzo abuse.  Supportive care, IV fluid support, one-to-one sitter. Delirium improved patient is alert and oriented now. She has some intermittent episodes of agitation She is going to inpatient psych facility.  *Benzodiazepine abuse [facility](HCC) -psychiatry consult appreciated.  Suggested inpatient psych admission.  Patient seems to be medically stable as vitals are and not requiring much medications.  *HTN (hypertension), benign -home dose antihypertensives *Diastolic dysfunction -continue home meds *Depression with anxiety -continue home meds except for  benzos   DISCHARGE CONDITIONS:   Stable.  CONSULTS OBTAINED:    DRUG ALLERGIES:   Allergies  Allergen Reactions  . Penicillins Rash    rash    DISCHARGE MEDICATIONS:   Allergies as of 09/29/2018      Reactions   Penicillins Rash   rash      Medication List    STOP taking these medications   ibuprofen 200 MG tablet Commonly known as: ADVIL   LORazepam 0.5 MG tablet Commonly known as: ATIVAN   oxyCODONE 5 MG immediate release tablet Commonly known as: Oxy IR/ROXICODONE   QUEtiapine 100 MG tablet Commonly known as: SEROQUEL     TAKE these medications   amLODipine 5 MG tablet Commonly known as: NORVASC Take by mouth.   aspirin EC 81 MG tablet Take 81 mg by mouth daily.   atenolol 100 MG tablet Commonly known as: TENORMIN Take 100 mg by mouth daily.   Calcium Plus D3 Absorbable [phone] MG-UNIT Caps Take 1 capsule by mouth daily with breakfast.   gabapentin 100 MG capsule Commonly known as: Neurontin Take 2 capsules (200 mg total) by mouth 2 (two) times daily AND 3 capsules (300 mg total) at bedtime.   lisinopril 5 MG tablet Commonly known as: ZESTRIL Take 10 mg by mouth daily.        DISCHARGE INSTRUCTIONS:   Follow with psych as advised.   If you experience worsening of your admission symptoms, develop shortness of breath, life threatening emergency, suicidal or homicidal thoughts you must seek medical attention immediately by calling 911 or calling your MD immediately  if symptoms less severe.  You Must read complete instructions/literature along with all the possible adverse reactions/side effects for all the  Medicines you take and that have been prescribed to you. Take any new Medicines after you have completely understood and accept all the possible adverse reactions/side effects.   Please note  You were cared for by a hospitalist during your hospital stay. If you have any questions about your discharge medications or the care you  received while you were in the hospital after you are discharged, you can call the unit and asked to speak with the hospitalist on call if the hospitalist that took care of you is not available. Once you are discharged, your primary care physician will handle any further medical issues. Please note that NO REFILLS for any discharge medications will be authorized once you are discharged, as it is imperative that you return to your primary care physician (or establish a relationship with a primary care physician if you do not have one) for your aftercare needs so that they can reassess your need for medications and monitor your lab values.    Today   CHIEF COMPLAINT:   Chief Complaint  Patient presents with  . Drug Overdose    HISTORY OF PRESENT ILLNESS:  Maria Wheeler  is a 63 y.o. female brought to the ED for significant confusion.  She was recently prescribed benzodiazepines, and reportedly has more than half the prescription missing over just a few days.  Strong concern for benzo overdose and drug-induced delirium.  Patient is confused and cannot contribute to the HPI.  Hospitalist were called for admission   VITAL SIGNS:  Blood pressure 128/83, pulse 68, temperature 99 F (37.2 C), resp. rate 18, height 5\' 3"  (1.6 m), weight 63.2 kg, SpO2 100 %.  I/O:    Intake/Output Summary (Last 24 hours) at 09/29/2018 0747 Last data filed at 09/28/2018 2300 Gross per 24 hour  Intake 240 ml  Output 150 ml  Net 90 ml    PHYSICAL EXAMINATION:  GENERAL:  63 y.o.-year-old patient lying in the bed with no acute distress.  EYES: Pupils equal, round, reactive to light and accommodation. No scleral icterus. Extraocular muscles intact.  HEENT: Head atraumatic, normocephalic. Oropharynx and nasopharynx clear.  NECK:  Supple, no jugular venous distention. No thyroid enlargement, no tenderness.  LUNGS: Normal breath sounds bilaterally, no wheezing, rales,rhonchi or crepitation. No use of accessory  muscles of respiration.  CARDIOVASCULAR: S1, S2 normal. No murmurs, rubs, or gallops.  ABDOMEN: Soft, non-tender, non-distended. Bowel sounds present. No organomegaly or mass.  EXTREMITIES: No pedal edema, cyanosis, or clubbing.  NEUROLOGIC: Cranial nerves II through XII are intact. Muscle strength 5/5 in all extremities. Sensation intact. Gait not checked.  PSYCHIATRIC: The patient is alert and oriented x 3.  SKIN: No obvious rash, lesion, or ulcer.   DATA REVIEW:   CBC Recent Labs  Lab 09/27/18 0623  WBC 12.1*  HGB 12.9  HCT 39.0  PLT 253    Chemistries  Recent Labs  Lab 09/26/18 1718  09/28/18 0943 09/29/18 0618  NA 140   < >  --  144  K 3.7   < >  --  3.3*  CL 103   < >  --  106  CO2 24   < >  --  27  GLUCOSE 132*   < >  --  98  BUN 9   < >  --  11  CREATININE 0.80   < >  --  0.74  CALCIUM 9.6   < >  --  9.5  MG  --   --  2.0  --   AST 25  --   --   --   ALT 13  --   --   --   ALKPHOS 82  --   --   --   BILITOT 0.7  --   --   --    < > = values in this interval not displayed.    Cardiac Enzymes No results for input(s): TROPONINI in the last 168 hours.  Microbiology Results  Results for orders placed or performed during the hospital encounter of 09/26/18  SARS Coronavirus 2 [facility] order, Performed in [facility] hospital lab) Nasopharyngeal Nasopharyngeal Swab     Status: None   Collection Time: 09/28/18  5:57 PM   Specimen: Nasopharyngeal Swab  Result Value Ref Range Status   SARS Coronavirus 2 NEGATIVE NEGATIVE Final    Comment: (NOTE) If result is NEGATIVE SARS-CoV-2 target nucleic acids are NOT DETECTED. The SARS-CoV-2 RNA is generally detectable in upper and lower  respiratory specimens during the acute phase of infection. The lowest  concentration of SARS-CoV-2 viral copies this assay can detect is 250  copies / mL. A negative result does not preclude SARS-CoV-2 infection  and should not be used as the sole basis for treatment or other  patient  management decisions.  A negative result may occur with  improper specimen collection / handling, submission of specimen other  than nasopharyngeal swab, presence of viral mutation(s) within the  areas targeted by this assay, and inadequate number of viral copies  (<250 copies / mL). A negative result must be combined with clinical  observations, patient history, and epidemiological information. If result is POSITIVE SARS-CoV-2 target nucleic acids are DETECTED. The SARS-CoV-2 RNA is generally detectable in upper and lower  respiratory specimens dur ing the acute phase of infection.  Positive  results are indicative of active infection with SARS-CoV-2.  Clinical  correlation with patient history and other diagnostic information is  necessary to determine patient infection status.  Positive results do  not rule out bacterial infection or co-infection with other viruses. If result is PRESUMPTIVE POSTIVE SARS-CoV-2 nucleic acids MAY BE PRESENT.   A presumptive positive result was obtained on the submitted specimen  and confirmed on repeat testing.  While 2019 novel coronavirus  (SARS-CoV-2) nucleic acids may be present in the submitted sample  additional confirmatory testing may be necessary for epidemiological  and / or clinical management purposes  to differentiate between  SARS-CoV-2 and other Sarbecovirus currently known to infect humans.  If clinically indicated additional testing with an alternate test  methodology [phone]) is advised. The SARS-CoV-2 RNA is generally  detectable in upper and lower respiratory sp ecimens during the acute  phase of infection. The expected result is Negative. Fact Sheet for Patients:  BoilerBrush.com.cy Fact Sheet for Healthcare Providers: https://pope.com/ This test is not yet approved or cleared by the [place] FDA and has been authorized for detection and/or diagnosis of SARS-CoV-2 by FDA under  an Emergency Use Authorization (EUA).  This EUA will remain in effect (meaning this test can be used) for the duration of the COVID-19 declaration under Section 564(b)(1) of the Act, 21 U.S.C. section 360bbb-3(b)(1), unless the authorization is terminated or revoked sooner. Performed at [facility], [address] [place], [place] [address]     RADIOLOGY:  No results found.  EKG:   Orders placed or performed during the hospital encounter of 09/26/18  . EKG 12-Lead  . EKG 12-Lead  Management plans discussed with the patient, family and they are in agreement.  CODE STATUS:     Code Status Orders  (From admission, onward)         Start     Ordered   09/27/18 0038  Full code  Continuous     09/27/18 0037        Code Status History    Date Active Date Inactive Code Status Order ID Comments User Context   12/31/2015 1441 01/01/2016 1910 Full Code [id number]  Lytle Butte, MD ED   12/11/2011 2235 12/14/2011 1733 Full Code [id number]  Arna Medici, RN Inpatient   Advance Care Planning Activity      TOTAL TIME TAKING CARE OF THIS PATIENT: 35 minutes.    Vaughan Basta M.D on 09/29/2018 at 7:47 AM  Between 7am to 6pm - Pager - [phone]  After 6pm go to [email or web address] - password [email or web address] [facility]  Office  [phone]  CC: Primary care physician; Baxter Hire, MD   Note: This dictation was prepared with Dragon dictation along with smaller phrase technology. Any transcriptional errors that result from this process are unintentional.

## 2018-09-29 NOTE — Progress Notes (Signed)
Patient discharged to [facility] inpatient geri-psych unit today. [facility] sheriff[facility] deputy transported patient. Clinical Education officer, museum (CSW) faxed D/C summary to Teacher, music. Please reconsult if future social work needs arise. CSW signing off.   [employer], LCSW [phone]

## 2018-12-06 ENCOUNTER — Ambulatory Visit: Payer: Medicare Other | Attending: Pain Medicine | Admitting: Pain Medicine

## 2018-12-06 ENCOUNTER — Other Ambulatory Visit: Payer: Self-pay

## 2018-12-06 DIAGNOSIS — M542 Cervicalgia: Secondary | ICD-10-CM

## 2018-12-06 DIAGNOSIS — M792 Neuralgia and neuritis, unspecified: Secondary | ICD-10-CM

## 2018-12-06 DIAGNOSIS — M79601 Pain in right arm: Secondary | ICD-10-CM

## 2018-12-06 DIAGNOSIS — G894 Chronic pain syndrome: Secondary | ICD-10-CM

## 2018-12-06 DIAGNOSIS — M79604 Pain in right leg: Secondary | ICD-10-CM

## 2018-12-06 DIAGNOSIS — G629 Polyneuropathy, unspecified: Secondary | ICD-10-CM

## 2018-12-06 DIAGNOSIS — E559 Vitamin D deficiency, unspecified: Secondary | ICD-10-CM

## 2018-12-06 DIAGNOSIS — G600 Hereditary motor and sensory neuropathy: Secondary | ICD-10-CM

## 2018-12-06 DIAGNOSIS — M79605 Pain in left leg: Secondary | ICD-10-CM

## 2018-12-06 DIAGNOSIS — G8929 Other chronic pain: Secondary | ICD-10-CM

## 2018-12-06 DIAGNOSIS — M79602 Pain in left arm: Secondary | ICD-10-CM

## 2018-12-06 NOTE — Progress Notes (Signed)
Unsuccessful attempt to contact patient for Virtual Visit (Pain Management Telehealth)   Patient provided contact information:  [phone] (home); [phone] (mobile); (Preferred) [phone] No e-mail address on record   Pre-screening:  Our staff was unsuccessful in contacting [name] using the above provided information.   I unsuccessfully attempted to make contact with Maria Wheeler on 12/06/2018 via telephone. I was unable to complete the virtual encounter due to call going directly to voicemail. I was unable to leave a message due to the mailbox being full and not accepting any new messages.  Pharmacotherapy Assessment  Analgesic: Oxycodone IR 5 mg, 1 tab PO q 6 hrs PRN (20mg /dayof oxycodone) MME/day:30mg /day.   Monitoring: Pharmacotherapy: No side-effects or adverse reactions reported. [place] PMP: PDMP reviewed during this encounter.        UDS:  Summary  Date Value Ref Range Status  08/30/2017 FINAL  Final    Comment:    ==================================================================== TOXASSURE SELECT 13 (MW) ==================================================================== Test                             Result       Flag       Units Drug Present and Declared for Prescription Verification   Oxycodone                      1004         EXPECTED   ng/mg creat   Oxymorphone                    473          EXPECTED   ng/mg creat   Noroxycodone                   1144         EXPECTED   ng/mg creat   Noroxymorphone                 136          EXPECTED   ng/mg creat    Sources of oxycodone are scheduled prescription medications.    Oxymorphone, noroxycodone, and noroxymorphone are expected    metabolites of oxycodone. Oxymorphone is also available as a    scheduled prescription medication. Drug Present not Declared for Prescription Verification   Lorazepam                      481          UNEXPECTED ng/mg creat    Source of lorazepam is a scheduled  prescription medication. ==================================================================== Test                      Result    Flag   Units      Ref Range   Creatinine              213              mg/dL      >=20 ==================================================================== Declared Medications:  The flagging and interpretation on this report are based on the  following declared medications.  Unexpected results may arise from  inaccuracies in the declared medications.  **Note: The testing scope of this panel includes these medications:  Oxycodone  **Note: The testing scope of this panel does not include following  reported medications:  Amlodipine Besylate  Aspirin (Aspirin 81)  Atenolol  Calcium carbonate (  Calcium carbonate/Vitamin D3)  Gabapentin  Ibuprofen  Lisinopril  Vitamin D3 (Calcium carbonate/Vitamin D3) ==================================================================== For clinical consultation, please call [phone]. ====================================================================     Follow-up plan:   No follow-ups on file.      Interventional management options: Planned, scheduled, and/or pending:   Agreement violation (09/01/18) additional HYDROCODONE!!! Back to close monitoring.   Considering:   Diagnostic right-sided L5-S1 LESI Diagnostic bilateral lumbar facet block Possiblebilateral lumbar facet RFA Diagnostic left sided CESI  Diagnostic bilateral cervical facet block Possible bilateral cervical facet RFA Diagnostic right shoulder injection Diagnostic right suprascapular nerve block Possible right sided suprascapular nerve RFA   Palliative PRN treatment(s):   Diagnostic left sided CESI #2      Recent Visits No visits were found meeting these conditions.  Showing recent visits within past 90 days and meeting all other requirements   Today's Visits Date Type Provider Dept  12/06/18 Telemedicine Delano Metz, MD  [facility]  Showing today's visits and meeting all other requirements   Future Appointments No visits were found meeting these conditions.  Showing future appointments within next 90 days and meeting all other requirements    Note by: Oswaldo Done, MD Date: 12/06/2018; Time: 4:47 PM

## 2019-01-02 DEATH — deceased
